# Patient Record
Sex: Female | Born: 1975 | Race: Black or African American | Hispanic: No | Marital: Single | Smoking: Current every day smoker
Health system: Southern US, Community
[De-identification: ages and names within clinical notes are randomized; demographics above are authoritative.]

## PROBLEM LIST (undated history)

## (undated) DIAGNOSIS — F319 Bipolar disorder, unspecified: Secondary | ICD-10-CM

## (undated) DIAGNOSIS — A599 Trichomoniasis, unspecified: Secondary | ICD-10-CM

## (undated) DIAGNOSIS — B9689 Other specified bacterial agents as the cause of diseases classified elsewhere: Secondary | ICD-10-CM

## (undated) DIAGNOSIS — N760 Acute vaginitis: Secondary | ICD-10-CM

## (undated) DIAGNOSIS — A749 Chlamydial infection, unspecified: Secondary | ICD-10-CM

## (undated) HISTORY — PX: FOOT SURGERY: SHX648

## (undated) HISTORY — PX: TUBAL LIGATION: SHX77

---

## 2011-06-06 ENCOUNTER — Emergency Department (HOSPITAL_COMMUNITY)
Admission: EM | Admit: 2011-06-06 | Discharge: 2011-06-06 | Disposition: A | Discharge status: Home / Self Care | Payer: Medicaid Other | Source: Home / Self Care

## 2011-06-06 ENCOUNTER — Encounter (HOSPITAL_COMMUNITY): Payer: Self-pay | Admitting: *Deleted

## 2011-06-06 DIAGNOSIS — N760 Acute vaginitis: Secondary | ICD-10-CM

## 2011-06-06 LAB — POCT URINALYSIS DIP (DEVICE)
Bilirubin Urine: NEGATIVE
Glucose, UA: 100 mg/dL — AB
Ketones, ur: NEGATIVE mg/dL
Protein, ur: NEGATIVE mg/dL
Specific Gravity, Urine: 1.02 (ref 1.005–1.030)

## 2011-06-06 LAB — WET PREP, GENITAL
Trich, Wet Prep: NONE SEEN
Yeast Wet Prep HPF POC: NONE SEEN

## 2011-06-06 LAB — POCT PREGNANCY, URINE: Preg Test, Ur: NEGATIVE

## 2011-06-06 LAB — HIV ANTIBODY (ROUTINE TESTING W REFLEX): HIV: NONREACTIVE

## 2011-06-06 MED ORDER — METRONIDAZOLE 500 MG PO TABS: 500.0000 mg | ORAL_TABLET | Freq: Two times a day (BID) | ORAL | Status: AC

## 2011-06-06 NOTE — Discharge Instructions (Signed)
You will be notified of any abnormal culture results. You must return in person with picture ID to receive your HIV results. No sexual activity for one week. Always use condoms.

## 2011-06-06 NOTE — ED Provider Notes (Signed)
Medical screening examination/treatment/procedure(s) were performed by non-physician practitioner and as supervising physician I was immediately available for consultation/collaboration.  Burnice Vassel, M.D.   Jalynne Persico C Brance Dartt, MD 06/06/11 1947 

## 2011-06-06 NOTE — ED Provider Notes (Signed)
History     CSN: [id number]  Arrival date & time 06/06/11  1129   None     Chief Complaint  Patient presents with  . Vaginal Discharge  . SEXUALLY TRANSMITTED DISEASE    (Consider location/radiation/quality/duration/timing/severity/associated sxs/prior treatment) HPI Comments: Patient presents today with complaints of vaginal discharge for the last 2 weeks. She denies vaginal pain, itch or order but has noticed mild irritation. She has been with her current partner for 6 months and is requesting STD testing including HIV test. She denies dysuria, urinary frequency or pelvic pain. 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   History reviewed. No pertinent past medical history.  History reviewed. No pertinent past surgical history.  History reviewed. No pertinent family history.  History  Substance Use Topics  . Smoking status: Never Smoker   . Smokeless tobacco: Not on file  . Alcohol Use: No    OB History    Grav Para Term Preterm Abortions TAB SAB Ect Mult Living                  Review of Systems  Constitutional: Negative for fever and chills.  Gastrointestinal: Negative for abdominal pain.  Genitourinary: Positive for vaginal discharge. Negative for dysuria, frequency, vaginal pain and pelvic pain.    Allergies  Review of patient's allergies indicates no known allergies.  Home Medications   Current Outpatient Rx  Name Route Sig Dispense Refill  . METRONIDAZOLE 500 MG PO TABS Oral Take 1 tablet (500 mg total) by mouth 2 (two) times daily. 14 tablet 0    BP 106/66  Pulse 76  Temp(Src) 98.2 F (36.8 C) (Oral)  Resp 16  SpO2 100%  LMP 05/30/2011  Physical Exam  Nursing note and vitals reviewed. Constitutional: She appears well-developed and well-nourished. No distress.  HENT:  Head: Normocephalic and atraumatic.  Genitourinary: Uterus normal. There is no rash, tenderness, lesion  or injury on the right labia. There is no rash, tenderness, lesion or injury on the left labia. Cervix exhibits no motion tenderness, no discharge and no friability. Right adnexum displays no mass, no tenderness and no fullness. Left adnexum displays no mass, no tenderness and no fullness. No erythema, tenderness or bleeding around the vagina. No foreign body around the vagina. Vaginal discharge (small amount of white discharge) found.  Neurological: She is alert.  Skin: Skin is warm and dry.  Psychiatric: She has a normal mood and affect.    ED Course  Procedures (including critical care time)  Labs Reviewed  POCT URINALYSIS DIP (DEVICE) - Abnormal; Notable for the following:    Glucose, UA 100 (*)    Nitrite POSITIVE (*)    Leukocytes, UA TRACE (*) Biochemical Testing Only. Please order routine urinalysis from main lab if confirmatory testing is needed.   All other components within normal limits  POCT PREGNANCY, URINE  GC/CHLAMYDIA PROBE AMP, GENITAL  WET PREP, GENITAL  HIV ANTIBODY (ROUTINE TESTING)  RPR   No results found.   1. Vaginitis       MDM  UA pos nitrite and trace leuks. Pt asymptomatic for UTI.         Melody Comas, [place] 06/06/11 1311

## 2011-06-06 NOTE — ED Notes (Signed)
Pt with vaginal discharge x 2 weeks wants STD check - has been with same partner x 6 months afraid he has been with other partners

## 2011-06-07 LAB — GC/CHLAMYDIA PROBE AMP, GENITAL: GC Probe Amp, Genital: NEGATIVE

## 2011-06-07 NOTE — ED Notes (Signed)
GC/Chlamydia pending, Wet prep: Mod. Clue cells, few WBC's, HIV and RPR non-reactive. Pt. adequately treated with Flagyl.  Vassie Moselle 06/07/2011

## 2011-06-08 LAB — URINE CULTURE
Colony Count: >=100000
Culture  Setup Time: 201303241824
Special Requests: NORMAL

## 2011-06-09 ENCOUNTER — Telehealth (HOSPITAL_COMMUNITY): Payer: Self-pay | Admitting: *Deleted

## 2011-06-09 MED ORDER — CEPHALEXIN 500 MG PO CAPS: 500.0000 mg | ORAL_CAPSULE | Freq: Two times a day (BID) | ORAL | Status: AC

## 2011-06-09 NOTE — ED Notes (Signed)
GC/Chlamydia neg., Wet prep: Mod. clue cells, few WBC's, HIV and RPR non-reactive, Urine culture: >100,000 colonies E. Coli.  Lab shown to [employer] PA and she e-prescribed Keflex to pt.'s preferred pharmacy. I called and left pt. a message to call. Vassie Moselle 06/09/2011

## 2011-06-09 NOTE — ED Notes (Signed)
Pt. called back. Pt. verified x 2 and given results except HIV.  Pt. told she would have to come with picture ID to view that result. She said she would be here later. I told pt. to finish her Flagyl for bacterial vaginosis. Pt. told she needs to take a Rx. of Keflex for her UTI. Pt. told it was e-prescribed to her preferred pharmacy. I asked her if she know where that is and she said [facility] on [address]. Pt. instructed to drink plenty of water to flush out the bacteria from her bladder. Pt. voiced understanding. Vassie Moselle 06/09/2011

## 2011-06-09 NOTE — ED Notes (Signed)
Pt. came in and verified with picture ID. I  showed her the neg. HIV result and instructed her to get it rechecked in 6 mos. I verified her preferred pharmacy in the system was the [facility] on [address]. Vassie Moselle 06/09/2011

## 2012-02-13 ENCOUNTER — Emergency Department (HOSPITAL_COMMUNITY)
Admission: EM | Admit: 2012-02-13 | Discharge: 2012-02-13 | Disposition: A | Discharge status: Home / Self Care | Payer: Medicaid Other | Source: Home / Self Care | Attending: Family Medicine | Admitting: Family Medicine

## 2012-02-13 ENCOUNTER — Other Ambulatory Visit (HOSPITAL_COMMUNITY)
Admission: RE | Admit: 2012-02-13 | Discharge: 2012-02-13 | Disposition: A | Discharge status: Home / Self Care | Payer: Medicaid Other | Source: Ambulatory Visit | Attending: Family Medicine | Admitting: Family Medicine

## 2012-02-13 ENCOUNTER — Encounter (HOSPITAL_COMMUNITY): Payer: Self-pay

## 2012-02-13 DIAGNOSIS — G8929 Other chronic pain: Secondary | ICD-10-CM

## 2012-02-13 DIAGNOSIS — N760 Acute vaginitis: Secondary | ICD-10-CM

## 2012-02-13 DIAGNOSIS — M79609 Pain in unspecified limb: Secondary | ICD-10-CM

## 2012-02-13 DIAGNOSIS — Z113 Encounter for screening for infections with a predominantly sexual mode of transmission: Secondary | ICD-10-CM | POA: Insufficient documentation

## 2012-02-13 DIAGNOSIS — Z202 Contact with and (suspected) exposure to infections with a predominantly sexual mode of transmission: Secondary | ICD-10-CM

## 2012-02-13 LAB — POCT I-STAT, CHEM 8
Calcium, Ion: 1.22 mmol/L (ref 1.12–1.23)
Chloride: 104 mEq/L (ref 96–112)
Creatinine, Ser: 1 mg/dL (ref 0.50–1.10)
Glucose, Bld: 94 mg/dL (ref 70–99)
Hemoglobin: 15.6 g/dL — ABNORMAL HIGH (ref 12.0–15.0)
Potassium: 4.1 mEq/L (ref 3.5–5.1)

## 2012-02-13 MED ORDER — FLUCONAZOLE 150 MG PO TABS: ORAL_TABLET | ORAL | Status: DC

## 2012-02-13 MED ORDER — TRAMADOL HCL 50 MG PO TABS: 50.0000 mg | ORAL_TABLET | Freq: Four times a day (QID) | ORAL | Status: DC | PRN

## 2012-02-13 NOTE — ED Notes (Signed)
Patient has many complaints today, states that she does have vaginal itching since Friday, no odor or discharge, she also states that she has left foot pain and wants to be referred to pain management, states that her left foot was broken approx 3 years ago and she still experiences a lot of pain and sometimes has problems walking, also states that she wants to have a HIV test repeated that she had done here in March 2013

## 2012-02-13 NOTE — ED Provider Notes (Signed)
History     CSN: [id number]  Arrival date & time 02/13/12  0901   First MD Initiated Contact with Patient 02/13/12 [phone]      Chief Complaint  Patient presents with  . Vaginal Itching    (Consider location/radiation/quality/duration/timing/severity/associated sxs/prior treatment) HPI Comments: 36 year old female here with the following complaints: #1 vaginal discharge itchiness and irritation for about 3 days. Denies dysuria. Denies pelvic pain. Patient status post bilateral tubal ligation. She was treated here in March for BV. Was tested for HIV. Patient reports unprotected sex again recently and would like to be rechecked for STDs. #2 left foot pain. Patient reports that she had a fracture on her left food over 2 years ago she has developed chronic pain since. Denies swelling or redness. She takes inconsistently over-the-counter Tylenol and Motrin for her pain. She would like to be referred to a pain specialist. Patient moves to [place] from [place] recently does not have a primary care provider here.   History reviewed. No pertinent past medical history.  History reviewed. No pertinent past surgical history.  No family history on file.  History  Substance Use Topics  . Smoking status: Never Smoker   . Smokeless tobacco: Not on file  . Alcohol Use: No    OB History    Grav Para Term Preterm Abortions TAB SAB Ect Mult Living                  Review of Systems  Constitutional: Negative for fever, chills, diaphoresis, activity change, appetite change and fatigue.  Genitourinary: Positive for vaginal discharge. Negative for dysuria, frequency, pelvic pain and dyspareunia.  Musculoskeletal:       Chronic left foot pain as per HPI  Skin: Negative for rash.  Neurological: Negative for dizziness and headaches.  All other systems reviewed and are negative.    Allergies  Review of patient's allergies indicates no known allergies.  Home Medications   Current  Outpatient Rx  Name  Route  Sig  Dispense  Refill  . FLUCONAZOLE 150 MG PO TABS      1 tablet by mouth every 72 hours x4   4 tablet   0   . TRAMADOL HCL 50 MG PO TABS   Oral   Take 1 tablet (50 mg total) by mouth every 6 (six) hours as needed for pain.   15 tablet   0     BP 115/74  Pulse 81  Temp 97.9 F (36.6 C) (Oral)  Resp 18  SpO2 98%  LMP 01/30/2012  Physical Exam  Nursing note and vitals reviewed. Constitutional: She is oriented to person, place, and time. She appears well-developed and well-nourished.  Cardiovascular: Normal heart sounds.   Pulmonary/Chest: Breath sounds normal.  Abdominal: Soft. There is no tenderness. Hernia confirmed negative in the right inguinal area and confirmed negative in the left inguinal area.  Genitourinary: Uterus normal. Cervix exhibits friability. Cervix exhibits no motion tenderness. Right adnexum displays no mass, no tenderness and no fullness. Left adnexum displays no mass, no tenderness and no fullness. No bleeding around the vagina. Vaginal discharge found.  Musculoskeletal:       Left foot: no swelling erythema or deformity. Reported tenderness with palpation diffusely over dorsal and lateral area. Ankle is stable with no swelling. No perimaleolar tenderness to palpation. Weight bearing. No limping. Dorsal pedal and tibial posterior pulses intact.   Lymphadenopathy:    She has no cervical adenopathy.       Right: No inguinal  adenopathy present.       Left: No inguinal adenopathy present.  Neurological: She is alert and oriented to person, place, and time.  Skin: No rash noted.    ED Course  Procedures (including critical care time)  Labs Reviewed  POCT I-STAT, CHEM 8 - Abnormal; Notable for the following:    Hemoglobin 15.6 (*)     All other components within normal limits  CERVICOVAGINAL ANCILLARY ONLY  HIV ANTIBODY (ROUTINE TESTING)   No results found.   1. Vaginitis and vulvovaginitis   2. Exposure to STD   3.  Chronic pain in left foot       MDM  Clinical findings suggestive of yeast vulvovaginitis. Treated empirically with Diflucan. GC, Chlamydia, HIV and wet prep pending at the time of discharge. Encouraged consistent condom use. Chronic pain treated with tramadol when necessary also encouraged to use Tylenol every 8 hours as needed for baseline. Patient was provided with community resources for finding a primary care provider here in [place].       Sharin Grave, MD 02/15/12 [phone]

## 2012-02-14 NOTE — ED Notes (Signed)
Cervicovaginal and HIV reports pending

## 2012-02-17 ENCOUNTER — Telehealth (HOSPITAL_COMMUNITY): Payer: Self-pay | Admitting: *Deleted

## 2012-02-17 NOTE — ED Notes (Signed)
GC/Chlamydia neg., HIV non-reactive, Affirm Vaginitis test: Candida pos., Gardnerella pos., and Trich neg. Message sent to Dr. Tressia Danas that pt. adequately treated with Diflucan.  She wrote back that she wants pt. to get Flagyl 500 mg. BID x 7 days.  I called pt.  Pt. verified x 2 and given results.    Pt. told she needs treatment with Flagyl and instructed to no alcohol while taking this medication.  Pt. wants Rx. called to [facility] at [address]. Rx. called to pharmacist @ [phone]. Vassie Moselle 02/17/2012

## 2012-03-05 ENCOUNTER — Emergency Department (HOSPITAL_COMMUNITY)
Admission: EM | Admit: 2012-03-05 | Discharge: 2012-03-05 | Disposition: A | Discharge status: Home / Self Care | Payer: Medicaid Other | Attending: Emergency Medicine | Admitting: Emergency Medicine

## 2012-03-05 ENCOUNTER — Encounter (HOSPITAL_COMMUNITY): Payer: Self-pay | Admitting: Emergency Medicine

## 2012-03-05 DIAGNOSIS — G8929 Other chronic pain: Secondary | ICD-10-CM | POA: Insufficient documentation

## 2012-03-05 DIAGNOSIS — Z8781 Personal history of (healed) traumatic fracture: Secondary | ICD-10-CM | POA: Insufficient documentation

## 2012-03-05 DIAGNOSIS — M79672 Pain in left foot: Secondary | ICD-10-CM

## 2012-03-05 DIAGNOSIS — M79609 Pain in unspecified limb: Secondary | ICD-10-CM | POA: Insufficient documentation

## 2012-03-05 MED ORDER — HYDROCODONE-ACETAMINOPHEN 5-325 MG PO TABS: 1.0000 | ORAL_TABLET | Freq: Four times a day (QID) | ORAL | Status: DC | PRN

## 2012-03-05 NOTE — ED Provider Notes (Addendum)
History     CSN: [id number]  Arrival date & time 03/05/12  9147   First MD Initiated Contact with Patient 03/05/12 [phone]      Chief Complaint  Patient presents with  . Foot Pain    (Consider location/radiation/quality/duration/timing/severity/associated sxs/prior treatment) Patient is a 36 y.o. female presenting with lower extremity pain. The history is provided by the patient.  Foot Pain This is a chronic problem. Episode onset: months. The problem occurs constantly. The problem has been gradually worsening. Associated symptoms comments: Foot fx years ago but worsening pain and referred to podiatrist for possible surgery in jan.. The symptoms are aggravated by walking and standing. The symptoms are relieved by rest. Treatments tried: tramadol. The treatment provided no relief.    History reviewed. No pertinent past medical history.  History reviewed. No pertinent past surgical history.  History reviewed. No pertinent family history.  History  Substance Use Topics  . Smoking status: Never Smoker   . Smokeless tobacco: Not on file  . Alcohol Use: No    OB History    Grav Para Term Preterm Abortions TAB SAB Ect Mult Living                  Review of Systems  All other systems reviewed and are negative.    Allergies  Review of patient's allergies indicates no known allergies.  Home Medications   Current Outpatient Rx  Name  Route  Sig  Dispense  Refill  . TRAMADOL HCL 50 MG PO TABS   Oral   Take 50 mg by mouth every 6 (six) hours as needed.         Marland Kitchen FLUCONAZOLE 150 MG PO TABS      1 tablet by mouth every 72 hours x4           BP 121/70  Pulse 102  Temp 98.4 F (36.9 C) (Oral)  Resp 20  Wt 218 lb (98.884 kg)  SpO2 98%  LMP 01/30/2012  Physical Exam  Nursing note and vitals reviewed. Constitutional: She is oriented to person, place, and time.  Pulmonary/Chest: Effort normal.  Musculoskeletal:       Left foot: She exhibits tenderness and bony  tenderness. She exhibits normal range of motion and no swelling.       Feet:  Neurological: She is alert and oriented to person, place, and time.  Skin: Skin is warm and dry. No rash noted. No erythema.    ED Course  Procedures (including critical care time)  Labs Reviewed - No data to display No results found.   1. Left foot pain       MDM   Pt with hx of chronic foot pain that has worsened after fracture years ago.  Pt has appt with podiatrist jan 10th but states tramadol is not helping the pain.  Has shoe inserts but stands all day at work.  No hx of new injury.  Will give vicodin for pain.        Gwyneth Sprout, MD 03/05/12 6213  Gwyneth Sprout, MD 03/05/12 [phone]

## 2012-03-05 NOTE — ED Notes (Addendum)
Pt c/o of left foot pain, hx. Broke foot 3 years ago. States she has growth on top of left distal foot. Hurts to walk. PT states she saw Podiatrist on Thurs and given Vicodin that "doesn't work"

## 2012-03-21 ENCOUNTER — Emergency Department (INDEPENDENT_AMBULATORY_CARE_PROVIDER_SITE_OTHER): Payer: Medicaid Other

## 2012-03-21 ENCOUNTER — Encounter (HOSPITAL_COMMUNITY): Payer: Self-pay

## 2012-03-21 ENCOUNTER — Emergency Department (HOSPITAL_COMMUNITY)
Admission: EM | Admit: 2012-03-21 | Discharge: 2012-03-21 | Disposition: A | Discharge status: Home / Self Care | Payer: Medicaid Other | Source: Home / Self Care

## 2012-03-21 DIAGNOSIS — R042 Hemoptysis: Secondary | ICD-10-CM

## 2012-03-21 DIAGNOSIS — M79609 Pain in unspecified limb: Secondary | ICD-10-CM

## 2012-03-21 DIAGNOSIS — J069 Acute upper respiratory infection, unspecified: Secondary | ICD-10-CM

## 2012-03-21 DIAGNOSIS — R05 Cough: Secondary | ICD-10-CM

## 2012-03-21 DIAGNOSIS — R059 Cough, unspecified: Secondary | ICD-10-CM

## 2012-03-21 DIAGNOSIS — G8929 Other chronic pain: Secondary | ICD-10-CM

## 2012-03-21 MED ORDER — GUAIFENESIN-CODEINE 100-10 MG/5ML PO SYRP: ORAL_SOLUTION | ORAL | Status: DC

## 2012-03-21 MED ORDER — HYDROCODONE-ACETAMINOPHEN 5-325 MG PO TABS: 1.0000 | ORAL_TABLET | ORAL | Status: DC | PRN

## 2012-03-21 MED ORDER — PHENYLEPHRINE-CHLORPHEN-DM 10-4-12.5 MG/5ML PO LIQD: 5.0000 mL | ORAL | Status: DC | PRN

## 2012-03-21 NOTE — ED Provider Notes (Signed)
Medical screening examination/treatment/procedure(s) were performed by resident physician or non-physician practitioner and as supervising physician I was immediately available for consultation/collaboration.   Barkley Bruns MD.    Linna Hoff, MD 03/21/12 1755

## 2012-03-21 NOTE — ED Provider Notes (Signed)
History     CSN: [id number]  Arrival date & time 03/21/12  1501   First MD Initiated Contact with Patient 03/21/12 [phone]      Chief Complaint  Patient presents with  . Cough    (Consider location/radiation/quality/duration/timing/severity/associated sxs/prior treatment) HPI Comments: 27. 37 year old female complaining of cough is worse at night. She states she can control it most of the time during the day with some sort lozenges. She says she is also coughing up blood. OTC medications are not helping. She denies shortness of breath or known wheezing. Chest no history of asthma.  2. This is the third  visit for chronic left foot pain. She states several years ago she broke her foot and then continues to have intermittent pain. She has seen an orthopedist or podiatrist recently had placed her on a foot brace and gave her a few pain medicines but she has run out. She states part of the planning is to have an MRI of her foot sometime in the near future. Part of her pain management is to have her be seen in a pain management. She is ambulatory and bearing full weight.   History reviewed. No pertinent past medical history.  History reviewed. No pertinent past surgical history.  History reviewed. No pertinent family history.  History  Substance Use Topics  . Smoking status: Never Smoker   . Smokeless tobacco: Not on file  . Alcohol Use: No    OB History    Grav Para Term Preterm Abortions TAB SAB Ect Mult Living                  Review of Systems  Constitutional: Negative.  Negative for fever and fatigue.  HENT: Positive for postnasal drip. Negative for congestion, sore throat and ear discharge.   Respiratory: Positive for cough.   Cardiovascular: Negative.   Gastrointestinal: Negative.   Genitourinary: Negative.   Musculoskeletal:       See history of present illness  Neurological: Negative.   Psychiatric/Behavioral: Negative.     Allergies  Review of patient's allergies  indicates no known allergies.  Home Medications   Current Outpatient Rx  Name  Route  Sig  Dispense  Refill  . FLUCONAZOLE 150 MG PO TABS      1 tablet by mouth every 72 hours x4         . GUAIFENESIN-CODEINE 100-10 MG/5ML PO SYRP      1-2 teaspoons every 4 hours prn cough   120 mL   0   . HYDROCODONE-ACETAMINOPHEN 5-325 MG PO TABS   Oral   Take 1 tablet by mouth every 6 (six) hours as needed for pain.   20 tablet   0   . HYDROCODONE-ACETAMINOPHEN 5-325 MG PO TABS   Oral   Take 1 tablet by mouth every 4 (four) hours as needed for pain.   15 tablet   0   . PHENYLEPHRINE-CHLORPHEN-DM 12-17-10.5 MG/5ML PO LIQD   Oral   Take 5 mLs by mouth every 4 (four) hours as needed. For cough and drainage  May cause drowsiness   120 mL   0   . TRAMADOL HCL 50 MG PO TABS   Oral   Take 50 mg by mouth every 6 (six) hours as needed.           BP 103/71  Pulse 99  Temp 99.9 F (37.7 C) (Oral)  Resp 18  SpO2 100%  LMP 03/03/2012  Physical Exam  Nursing note  and vitals reviewed. Constitutional: She is oriented to person, place, and time. She appears well-developed and well-nourished. No distress.  HENT:       Bilateral TMs are normal Oropharynx with minor erythema and clear PND. No exudates  Eyes: Conjunctivae normal and EOM are normal.  Neck: Normal range of motion. Neck supple.  Cardiovascular: Normal rate, regular rhythm and normal heart sounds.   Pulmonary/Chest: Effort normal and breath sounds normal. No respiratory distress. She has no wheezes. She has no rales.  Musculoskeletal: Normal range of motion.  Lymphadenopathy:    She has no cervical adenopathy.  Neurological: She is alert and oriented to person, place, and time. She exhibits normal muscle tone.  Skin: Skin is warm and dry.  Psychiatric: She has a normal mood and affect.    ED Course  Procedures (including critical care time)  Labs Reviewed - No data to display Dg Chest 2 View  03/21/2012   *RADIOLOGY REPORT*  Clinical Data: Cough.  Fever.  CHEST - 2 VIEW  Comparison: None.  Findings:  Cardiopericardial silhouette within normal limits. Mediastinal contours normal. Trachea midline.  No airspace disease or effusion.  Dextroconvex thoracic scoliosis is present.  IMPRESSION: No active cardiopulmonary disease.   Original Report Authenticated By: Andreas Newport, M.D.      1. Chronic pain in left foot   2. URI (upper respiratory infection)   3. Cough   4. Hemoptysis       MDM  The patient's cough is most likely due to her PND. Chest some injection in the posterior pharynx with irritation and may suggest mucosal irritation and minor breakdown. This could be the source of her blood. She does not have shortness of breath the lungs are clear. Chest x-ray is negative. She was advised that if she has hemoptysis for another week despite improvement in her cough she is to followup with her PCP promptly. There is no known history of exposure to anyone with TB. And she does not have a past medical history of lung infections. Norell CS 1 teaspoon every 4-6 hours when necessary cough and drainage Cheratussin 1-2 teaspoons every 4 hours when necessary cough Norco 5 one every 4 hours when necessary pain #15 Must followup with your podiatrist as scheduled. He podiatrist or ear pain management clinic will have to be the prescriber should for chronic foot pain.        Hayden Rasmussen, NP 03/21/12 1747

## 2012-03-21 NOTE — ED Notes (Signed)
Cough for several days, keeps her awake at night; also has chronic pain in her foot , and needs something to carry her over till she can see her foot MD

## 2012-06-19 ENCOUNTER — Emergency Department (HOSPITAL_COMMUNITY)
Admission: EM | Admit: 2012-06-19 | Discharge: 2012-06-19 | Disposition: A | Discharge status: Home / Self Care | Payer: Medicaid Other | Attending: Emergency Medicine | Admitting: Emergency Medicine

## 2012-06-19 ENCOUNTER — Encounter (HOSPITAL_COMMUNITY): Payer: Self-pay | Admitting: Emergency Medicine

## 2012-06-19 ENCOUNTER — Emergency Department (HOSPITAL_COMMUNITY): Payer: Medicaid Other

## 2012-06-19 DIAGNOSIS — Z79899 Other long term (current) drug therapy: Secondary | ICD-10-CM | POA: Insufficient documentation

## 2012-06-19 DIAGNOSIS — B9689 Other specified bacterial agents as the cause of diseases classified elsewhere: Secondary | ICD-10-CM

## 2012-06-19 DIAGNOSIS — F17200 Nicotine dependence, unspecified, uncomplicated: Secondary | ICD-10-CM | POA: Insufficient documentation

## 2012-06-19 DIAGNOSIS — N940 Mittelschmerz: Secondary | ICD-10-CM

## 2012-06-19 DIAGNOSIS — I10 Essential (primary) hypertension: Secondary | ICD-10-CM | POA: Insufficient documentation

## 2012-06-19 DIAGNOSIS — N760 Acute vaginitis: Secondary | ICD-10-CM

## 2012-06-19 DIAGNOSIS — Z3202 Encounter for pregnancy test, result negative: Secondary | ICD-10-CM | POA: Insufficient documentation

## 2012-06-19 LAB — RPR: RPR Ser Ql: NONREACTIVE

## 2012-06-19 LAB — CBC WITH DIFFERENTIAL/PLATELET
Eosinophils Relative: 2 % (ref 0–5)
HCT: 38 % (ref 36.0–46.0)
Lymphocytes Relative: 31 % (ref 12–46)
Lymphs Abs: 2 10*3/uL (ref 0.7–4.0)
MCV: 85.4 fL (ref 78.0–100.0)
Monocytes Absolute: 0.5 10*3/uL (ref 0.1–1.0)
Monocytes Relative: 8 % (ref 3–12)
RBC: 4.45 MIL/uL (ref 3.87–5.11)
WBC: 6.3 10*3/uL (ref 4.0–10.5)

## 2012-06-19 LAB — COMPREHENSIVE METABOLIC PANEL
ALT: 9 U/L (ref 0–35)
CO2: 25 mEq/L (ref 19–32)
Calcium: 9.2 mg/dL (ref 8.4–10.5)
Creatinine, Ser: 0.83 mg/dL (ref 0.50–1.10)
GFR calc Af Amer: >90 mL/min (ref >90)
GFR calc non Af Amer: 89 mL/min — ABNORMAL LOW (ref >90)
Glucose, Bld: 83 mg/dL (ref 70–99)

## 2012-06-19 LAB — URINALYSIS, ROUTINE W REFLEX MICROSCOPIC
Glucose, UA: NEGATIVE mg/dL
Hgb urine dipstick: NEGATIVE
Specific Gravity, Urine: 1.014 (ref 1.005–1.030)

## 2012-06-19 LAB — WET PREP, GENITAL: Trich, Wet Prep: NONE SEEN

## 2012-06-19 LAB — URINE MICROSCOPIC-ADD ON

## 2012-06-19 LAB — POCT PREGNANCY, URINE: Preg Test, Ur: NEGATIVE

## 2012-06-19 MED ORDER — HYDROMORPHONE HCL PF 1 MG/ML IJ SOLN
1.0000 mg | Freq: Once | INTRAMUSCULAR | Status: AC
  Administered 2012-06-19: 1 mg via INTRAVENOUS
  Filled 2012-06-19: qty 1

## 2012-06-19 MED ORDER — SODIUM CHLORIDE 0.9 % IV SOLN
Freq: Once | INTRAVENOUS | Status: AC
  Administered 2012-06-19: 10:00:00 via INTRAVENOUS

## 2012-06-19 MED ORDER — OXYCODONE-ACETAMINOPHEN 5-325 MG PO TABS: 1.0000 | ORAL_TABLET | ORAL | Status: DC | PRN

## 2012-06-19 MED ORDER — ACETAMINOPHEN 325 MG PO TABS
650.0000 mg | ORAL_TABLET | Freq: Once | ORAL | Status: AC
  Administered 2012-06-19: 650 mg via ORAL
  Filled 2012-06-19: qty 2

## 2012-06-19 MED ORDER — POTASSIUM CHLORIDE CRYS ER 20 MEQ PO TBCR
40.0000 meq | EXTENDED_RELEASE_TABLET | Freq: Once | ORAL | Status: AC
  Administered 2012-06-19: 40 meq via ORAL
  Filled 2012-06-19: qty 2

## 2012-06-19 MED ORDER — METRONIDAZOLE 500 MG PO TABS: 500.0000 mg | ORAL_TABLET | Freq: Two times a day (BID) | ORAL | Status: DC

## 2012-06-19 MED ORDER — ONDANSETRON HCL 4 MG/2ML IJ SOLN
4.0000 mg | Freq: Once | INTRAMUSCULAR | Status: AC
  Administered 2012-06-19: 4 mg via INTRAVENOUS
  Filled 2012-06-19: qty 2

## 2012-06-19 NOTE — ED Notes (Signed)
Patient walked to restroom and tolerated well.  

## 2012-06-19 NOTE — ED Notes (Signed)
Patient request to get off the monitor so she could walk around.  "I know it's gas, so I have to walk".

## 2012-06-19 NOTE — ED Notes (Signed)
Patient states she had been cleaning and had no pain.   She came home and her R lower abdomen started hurting 9/10 pain that was sharp with no radiation.   Patient states that the pain has stayed constant since yesterday.  Patient took tylenol for pain without any relief. Patient denies N/V but did have some diarrhea.

## 2012-06-19 NOTE — ED Provider Notes (Signed)
History     CSN: [id number]  Arrival date & time 06/19/12  0809   First MD Initiated Contact with Patient 06/19/12 [phone]      Chief Complaint  Patient presents with  . Abdominal Pain    Right lower abdominal pain without radiation.    (Consider location/radiation/quality/duration/timing/severity/associated sxs/prior treatment) Patient is a 37 y.o. female presenting with abdominal pain. The history is provided by the patient.  Abdominal Pain She had onset yesterday of pain in the right lower abdomen with radiation toward the right flank. It occurred while cleaning. It is moderately severe and she rates it an 8/10. It is worse with sitting but nothing makes it better. There is no associated nausea, vomiting, dysuria, vaginal discharge. She has noted diarrhea but pain is not affected by bowel movement. She denies fever, chills, sweats. She's not taken anything for pain. Last menstrual period was 2 weeks ago was normal. She status post tubal ligation.  Past Medical History  Diagnosis Date  . Hypertension     Past Surgical History  Procedure Laterality Date  . Tubal ligation      No family history on file.  History  Substance Use Topics  . Smoking status: Current Every Day Smoker -- 0.10 packs/day    Types: Cigarettes  . Smokeless tobacco: Not on file  . Alcohol Use: No    OB History   Grav Para Term Preterm Abortions TAB SAB Ect Mult Living                  Review of Systems  Gastrointestinal: Positive for abdominal pain.  All other systems reviewed and are negative.    Allergies  Review of patient's allergies indicates no known allergies.  Home Medications   Current Outpatient Rx  Name  Route  Sig  Dispense  Refill  . hydrochlorothiazide (HYDRODIURIL) 25 MG tablet   Oral   Take 25 mg by mouth daily.           BP 127/69  Pulse 100  Temp(Src) 98.2 F (36.8 C) (Oral)  Resp 18  Ht 5\' 6"  (1.676 m)  Wt 206 lb (93.441 kg)  BMI 33.27 kg/m2  SpO2 100%   LMP 06/05/2012  Physical Exam  Nursing note and vitals reviewed.  37 year old female, resting comfortably and in no acute distress. Vital signs are normal. Oxygen saturation is 100%, which is normal. Head is normocephalic and atraumatic. PERRLA, EOMI. Oropharynx is clear. Neck is nontender and supple without adenopathy or JVD. Back is nontender and there is mild right CVA tenderness. Lungs are clear without rales, wheezes, or rhonchi. Chest is nontender. Heart has regular rate and rhythm without murmur. Abdomen is soft, flat, with significant tenderness in the right mid and lower abdomen and across the suprapubic area. There is no rebound or guarding. There are no masses or hepatosplenomegaly and peristalsis is hypoactive. Extremities have no cyanosis or edema, full range of motion is present. Skin is warm and dry without rash. Neurologic: Mental status is normal, cranial nerves are intact, there are no motor or sensory deficits.  ED Course  Procedures (including critical care time)  Results for orders placed during the hospital encounter of 06/19/12  WET PREP, GENITAL      Result Value Range   Yeast Wet Prep HPF POC NONE SEEN  NONE SEEN   Trich, Wet Prep NONE SEEN  NONE SEEN   Clue Cells Wet Prep HPF POC FEW (*) NONE SEEN   WBC, Wet  Prep HPF POC TOO NUMEROUS TO COUNT (*) NONE SEEN  URINALYSIS, ROUTINE W REFLEX MICROSCOPIC      Result Value Range   Color, Urine YELLOW  YELLOW   APPearance CLEAR  CLEAR   Specific Gravity, Urine 1.014  1.005 - 1.030   pH 6.5  5.0 - 8.0   Glucose, UA NEGATIVE  NEGATIVE mg/dL   Hgb urine dipstick NEGATIVE  NEGATIVE   Bilirubin Urine NEGATIVE  NEGATIVE   Ketones, ur NEGATIVE  NEGATIVE mg/dL   Protein, ur NEGATIVE  NEGATIVE mg/dL   Urobilinogen, UA 1.0  0.0 - 1.0 mg/dL   Nitrite NEGATIVE  NEGATIVE   Leukocytes, UA MODERATE (*) NEGATIVE  CBC WITH DIFFERENTIAL      Result Value Range   WBC 6.3  4.0 - 10.5 K/uL   RBC 4.45  3.87 - 5.11 MIL/uL    Hemoglobin 13.4  12.0 - 15.0 g/dL   HCT 16.1  09.6 - 04.5 %   MCV 85.4  78.0 - 100.0 fL   MCH 30.1  26.0 - 34.0 pg   MCHC 35.3  30.0 - 36.0 g/dL   RDW 40.9  81.1 - 91.4 %   Platelets 196  150 - 400 K/uL   Neutrophils Relative 59  43 - 77 %   Neutro Abs 3.7  1.7 - 7.7 K/uL   Lymphocytes Relative 31  12 - 46 %   Lymphs Abs 2.0  0.7 - 4.0 K/uL   Monocytes Relative 8  3 - 12 %   Monocytes Absolute 0.5  0.1 - 1.0 K/uL   Eosinophils Relative 2  0 - 5 %   Eosinophils Absolute 0.1  0.0 - 0.7 K/uL   Basophils Relative 0  0 - 1 %   Basophils Absolute 0.0  0.0 - 0.1 K/uL  COMPREHENSIVE METABOLIC PANEL      Result Value Range   Sodium 138  135 - 145 mEq/L   Potassium 3.2 (*) 3.5 - 5.1 mEq/L   Chloride 103  96 - 112 mEq/L   CO2 25  19 - 32 mEq/L   Glucose, Bld 83  70 - 99 mg/dL   BUN 7  6 - 23 mg/dL   Creatinine, Ser 7.82  0.50 - 1.10 mg/dL   Calcium 9.2  8.4 - 95.6 mg/dL   Total Protein 7.3  6.0 - 8.3 g/dL   Albumin 3.8  3.5 - 5.2 g/dL   AST 27  0 - 37 U/L   ALT 9  0 - 35 U/L   Alkaline Phosphatase 60  39 - 117 U/L   Total Bilirubin 0.4  0.3 - 1.2 mg/dL   GFR calc non Af Amer 89 (*) >90 mL/min   GFR calc Af Amer >90  >90 mL/min  LIPASE, BLOOD      Result Value Range   Lipase 23  11 - 59 U/L  URINE MICROSCOPIC-ADD ON      Result Value Range   Squamous Epithelial / LPF FEW (*) RARE   WBC, UA 3-6  <3 WBC/hpf   Bacteria, UA FEW (*) RARE  POCT PREGNANCY, URINE      Result Value Range   Preg Test, Ur NEGATIVE  NEGATIVE   Ct Abdomen Pelvis Wo Contrast  06/19/2012  *RADIOLOGY REPORT*  Clinical Data:  Right-sided pain.  Symptoms since yesterday. Diarrhea.  CT ABDOMEN AND PELVIS WITHOUT CONTRAST (CT UROGRAM)  Technique: Contiguous axial images of the abdomen and pelvis without oral or intravenous contrast were obtained.  Comparison: None  Findings:  Exam is limited for evaluation of entities other than urinary tract calculi due to lack of oral or intravenous contrast.   Lung bases:  Normal   Abdomen/pelvis:  Normal uninfused appearance of the liver, spleen, stomach, pancreas, gallbladder, biliary tract, adrenal glands.  No renal calculi or hydronephrosis.  Phleboliths in the pelvis, but no hydroureter or ureteric stone.  No retroperitoneal or retrocrural adenopathy.  Normal colon and terminal ileum.  The appendix crosses midline. Distally, it is gas filled and unremarkable on image 57/series 2. More proximally, it measures upper normal to minimally dilated, 9 mm on coronal image 44.  No convincing evidence of surrounding inflammation.  Normal small bowel without abdominal ascites.  No pelvic adenopathy.  Normal urinary bladder.  Retroverted uterus. Small volume simple appearing pelvic fluid, slightly greater than typically seen physiologically.  No gross adnexal mass.  Bones/Musculoskeletal:  No acute osseous abnormality.  IMPRESSION: 1.  No urinary tract calculi or hydronephrosis. 2.  Normal appearance of the distal appendix with borderline to minimal enlargement of the proximal appendix.  If early appendicitis is a clinical concern, consider repeat CT with intravenous and preferably enteric contrast. 3.  Slightly greater than typical simple appearing pelvic fluid. Question recent follicle rupture.   Original Report Authenticated By: Jeronimo Greaves, M.D.       1. Mittelschmerz   2. Bacterial vaginosis       MDM  Right lower quadrant pain of uncertain cause. She needs to be evaluated for possible ovarian cyst, possible UTI, possible kidney stone, possible appendicitis. She will be given IV fluids and IV narcotics. Review of past records showed no prior ED visits for abdominal pain but she has had prior he visits for STD check.  Pelvic exam: Normal external female genitalia, moderate discharge which appears somewhat purulent, no cervical motion tenderness, fundus normal size and position, bilateral adnexal tenderness which is moderate without any adnexal masses. Urinalysis is come back fairly  unremarkable. Picture at this point seems most consistent with mittelschmerz, but she will be sent for CT scan to rule out urolithiasis.  CT scan is consistent with mittelschmerz. No evidence of appendicitis or urolithiasis. Wet prep is demonstrated the bacterial vaginosis. She is discharged with prescriptions for metronidazole and Percocet.  Dione Booze, MD 06/19/12 1345

## 2012-06-19 NOTE — ED Notes (Signed)
Pt left before vitals could be updated and would not wait.

## 2012-06-19 NOTE — ED Notes (Signed)
Patient disconnected the saline stating "this gives me gas or something and makes me cramp.  I don't want no more."

## 2012-06-20 LAB — URINE CULTURE

## 2012-06-20 LAB — GC/CHLAMYDIA PROBE AMP: CT Probe RNA: NEGATIVE

## 2012-09-06 ENCOUNTER — Emergency Department (HOSPITAL_COMMUNITY)
Admission: EM | Admit: 2012-09-06 | Discharge: 2012-09-06 | Disposition: A | Discharge status: Home / Self Care | Payer: Medicaid Other | Attending: Emergency Medicine | Admitting: Emergency Medicine

## 2012-09-06 ENCOUNTER — Encounter (HOSPITAL_COMMUNITY): Payer: Self-pay | Admitting: Emergency Medicine

## 2012-09-06 DIAGNOSIS — G8929 Other chronic pain: Secondary | ICD-10-CM

## 2012-09-06 DIAGNOSIS — F17200 Nicotine dependence, unspecified, uncomplicated: Secondary | ICD-10-CM | POA: Insufficient documentation

## 2012-09-06 DIAGNOSIS — M25579 Pain in unspecified ankle and joints of unspecified foot: Secondary | ICD-10-CM | POA: Insufficient documentation

## 2012-09-06 DIAGNOSIS — Z9889 Other specified postprocedural states: Secondary | ICD-10-CM | POA: Insufficient documentation

## 2012-09-06 DIAGNOSIS — Z79899 Other long term (current) drug therapy: Secondary | ICD-10-CM | POA: Insufficient documentation

## 2012-09-06 DIAGNOSIS — I10 Essential (primary) hypertension: Secondary | ICD-10-CM | POA: Insufficient documentation

## 2012-09-06 MED ORDER — KETOROLAC TROMETHAMINE 30 MG/ML IJ SOLN
30.0000 mg | Freq: Once | INTRAMUSCULAR | Status: AC
  Administered 2012-09-06: 30 mg via INTRAMUSCULAR
  Filled 2012-09-06: qty 1

## 2012-09-06 MED ORDER — TRAMADOL HCL 50 MG PO TABS: 50.0000 mg | ORAL_TABLET | Freq: Four times a day (QID) | ORAL | Status: DC | PRN

## 2012-09-06 NOTE — ED Notes (Signed)
Patient states has chronic L foot pain.  Patient states she has been having pain for 11 years.  She has been going to a pain clinic.   She advised she hasn't been able to get in to see them to get a refill on pain medication.   Patient states she has been given "percocet 10's".  The clinic where she had been seen [facility]  She claims she can never get in touch with them.  She claims needs her percocets upped and needs a few days to get her through.  Patient states she hasn't had pain medicine for 1 week.

## 2012-09-06 NOTE — ED Notes (Signed)
Beck PA at bedside to evaluate patient.

## 2012-09-06 NOTE — ED Provider Notes (Signed)
History    CSN: [id number] Arrival date & time 09/06/12  0921  First MD Initiated Contact with Patient 09/06/12 0935     Chief Complaint  Patient presents with  . Foot Pain   (Consider location/radiation/quality/duration/timing/severity/associated sxs/prior Treatment) HPI Comments: 37 y.o. Female with PMH of HTN presents today with an exacerbation of 37 year old chronic left foot pain that has been gradually worsening over a period of week. Pt states she did have surgery on it 11 years ago, was told she did not let it heal properly and has been dealing with the pain ever since. Pt has moved around quite a bit since then and has never established herself as a pt with one doctor or one pain management place. She is now seeing Dr. Mayford Knife who is helping her get to a chronic pain management office, but she has been unable to reach them yet. She is here today seeking relief of pain.   No new injury. Pain is sharp. Localized to the dorsum of the foot proximal to the toes. Severe at times. Intermittent. Pt states percocet makes it better, but she is out.   Patient is a 37 y.o. female presenting with lower extremity pain.  Foot Pain Associated symptoms include arthralgias. Pertinent negatives include no chest pain, diaphoresis, fever, headaches, nausea, neck pain, numbness, rash, vomiting or weakness.   Past Medical History  Diagnosis Date  . Hypertension    Past Surgical History  Procedure Laterality Date  . Tubal ligation    . Foot surgery     No family history on file. History  Substance Use Topics  . Smoking status: Current Every Day Smoker -- 0.50 packs/day    Types: Cigarettes  . Smokeless tobacco: Not on file  . Alcohol Use: No   OB History   Grav Para Term Preterm Abortions TAB SAB Ect Mult Living                 Review of Systems  Constitutional: Negative for fever and diaphoresis.  HENT: Negative for neck pain and neck stiffness.   Eyes: Negative for visual  disturbance.  Respiratory: Negative for apnea, chest tightness and shortness of breath.   Cardiovascular: Negative for chest pain and palpitations.  Gastrointestinal: Negative for nausea, vomiting, diarrhea and constipation.  Genitourinary: Negative for dysuria.  Musculoskeletal: Positive for arthralgias. Negative for gait problem.       Left foot pain  Skin: Negative for rash.  Neurological: Negative for dizziness, weakness, light-headedness, numbness and headaches.    Allergies  Review of patient's allergies indicates no known allergies.  Home Medications   Current Outpatient Rx  Name  Route  Sig  Dispense  Refill  . hydrochlorothiazide (HYDRODIURIL) 25 MG tablet   Oral   Take 25 mg by mouth daily.         . metroNIDAZOLE (FLAGYL) 500 MG tablet   Oral   Take 1 tablet (500 mg total) by mouth 2 (two) times daily.   14 tablet   0   . oxyCODONE-acetaminophen (PERCOCET/ROXICET) 5-325 MG per tablet   Oral   Take 1 tablet by mouth every 4 (four) hours as needed for pain.   20 tablet   0    BP 114/79  Pulse 91  Temp(Src) 98.5 F (36.9 C) (Oral)  Resp 16  Ht 5\' 6"  (1.676 m)  Wt 200 lb (90.719 kg)  BMI 32.3 kg/m2  SpO2 100%  LMP 08/23/2012 Physical Exam  Nursing note and vitals reviewed.  Constitutional: She is oriented to person, place, and time. She appears well-developed and well-nourished. No distress.  HENT:  Head: Normocephalic and atraumatic.  Eyes: Conjunctivae and EOM are normal.  Neck: Normal range of motion. Neck supple.  No meningeal signs  Cardiovascular: Normal rate, regular rhythm, normal heart sounds and intact distal pulses.  Exam reveals no gallop and no friction rub.   No murmur heard. Pulmonary/Chest: Effort normal and breath sounds normal. No respiratory distress. She has no wheezes. She has no rales. She exhibits no tenderness.  Abdominal: Soft. Bowel sounds are normal. She exhibits no distension. There is no tenderness. There is no rebound and  no guarding.  Musculoskeletal: Normal range of motion. She exhibits no edema and no tenderness.  FROM to upper and lower extremities No tenderness to palpation where pt endorses pain (dorsal aspect of left foot proximal to toes)  Neurological: She is alert and oriented to person, place, and time. No cranial nerve deficit.  Speech is clear and goal oriented, follows commands Sensation normal to light touch and two point discrimination Moves extremities without ataxia, coordination intact Normal gait and balance Normal strength in upper and lower extremities bilaterally including dorsiflexion and plantar flexion, strong and equal grip strength   Skin: Skin is warm and dry. She is not diaphoretic. No erythema.  Psychiatric: She has a normal mood and affect.    ED Course  Procedures (including critical care time)  Medications  ketorolac (TORADOL) 30 MG/ML injection 30 mg (not administered)    Labs Reviewed - No data to display No results found. 1. Chronic foot pain, left    New Prescriptions   TRAMADOL (ULTRAM) 50 MG TABLET    Take 1 tablet (50 mg total) by mouth every 6 (six) hours as needed for pain.    MDM  No new injury. PE is benign for concern of bony injury. Neurovascularly intact. Patient with back pain.   I feel that the pt's pain is chronic and cannot appropriately or safely treated in an emergency department setting.  I do not feel that providing narcotic pain medication is in this pt's best interest.  I have urged the pt to follow up closely with their PMD or pain specialist.  I have explicitly discussed with the pt return precautions and have reassured him that she can always be seen and evaluated in the emergency department for any condition that she feels is emergent, and that she will be given treatment as is appropriate and safe, but this may not involve the use of narcotic pain medications.  The pt was given the opportunity to voice any further questions or concerns and  these were addressed to the best of my ability.  Discussed chronic pain management and that the ED is not designed to treat chronic pain. Provided resource list that includes the number for Pain Management as well as information on ED chronic pain management policy. Will give pt toradol here today to relieve pain and prescribe ultram for home. Discussed reasons to seek immediate care. Patient expresses understanding and agrees with plan.    Glade Nurse, PA-C 09/06/12 1000

## 2012-09-07 NOTE — ED Provider Notes (Signed)
Medical screening examination/treatment/procedure(s) were performed by non-physician practitioner and as supervising physician I was immediately available for consultation/collaboration.  Olivia Mackie, MD 09/07/12 [phone]

## 2012-09-24 ENCOUNTER — Other Ambulatory Visit (HOSPITAL_COMMUNITY)
Admission: RE | Admit: 2012-09-24 | Discharge: 2012-09-24 | Disposition: A | Discharge status: Home / Self Care | Payer: Medicaid Other | Source: Ambulatory Visit | Attending: Family Medicine | Admitting: Family Medicine

## 2012-09-24 ENCOUNTER — Emergency Department (HOSPITAL_COMMUNITY)
Admission: EM | Admit: 2012-09-24 | Discharge: 2012-09-24 | Disposition: A | Discharge status: Home / Self Care | Payer: Medicaid Other | Source: Home / Self Care | Attending: Family Medicine | Admitting: Family Medicine

## 2012-09-24 ENCOUNTER — Encounter (HOSPITAL_COMMUNITY): Payer: Self-pay | Admitting: *Deleted

## 2012-09-24 DIAGNOSIS — B9689 Other specified bacterial agents as the cause of diseases classified elsewhere: Secondary | ICD-10-CM

## 2012-09-24 DIAGNOSIS — Z113 Encounter for screening for infections with a predominantly sexual mode of transmission: Secondary | ICD-10-CM | POA: Insufficient documentation

## 2012-09-24 DIAGNOSIS — N760 Acute vaginitis: Secondary | ICD-10-CM | POA: Insufficient documentation

## 2012-09-24 DIAGNOSIS — A499 Bacterial infection, unspecified: Secondary | ICD-10-CM

## 2012-09-24 HISTORY — DX: Trichomoniasis, unspecified: A59.9

## 2012-09-24 HISTORY — DX: Other specified bacterial agents as the cause of diseases classified elsewhere: B96.89

## 2012-09-24 HISTORY — DX: Acute vaginitis: N76.0

## 2012-09-24 HISTORY — DX: Chlamydial infection, unspecified: A74.9

## 2012-09-24 LAB — POCT URINALYSIS DIP (DEVICE)
Bilirubin Urine: NEGATIVE
Glucose, UA: NEGATIVE mg/dL
Ketones, ur: NEGATIVE mg/dL
pH: 6.5 (ref 5.0–8.0)

## 2012-09-24 LAB — POCT PREGNANCY, URINE: Preg Test, Ur: NEGATIVE

## 2012-09-24 MED ORDER — METRONIDAZOLE 250 MG PO TABS: 250.0000 mg | ORAL_TABLET | Freq: Three times a day (TID) | ORAL | Status: DC

## 2012-09-24 MED ORDER — METRONIDAZOLE 0.75 % VA GEL: 1.0000 | VAGINAL | Status: DC

## 2012-09-24 NOTE — ED Notes (Signed)
C/O malodorous vaginal discharge x 1 wk.  Denies pain.  Has unprotected intercourse with unfaithful partner.

## 2012-09-24 NOTE — ED Provider Notes (Signed)
History    CSN: [id number] Arrival date & time 09/24/12  1103  First MD Initiated Contact with Patient 09/24/12 1111     Chief Complaint  Patient presents with  . Vaginal Discharge   (Consider location/radiation/quality/duration/timing/severity/associated sxs/prior Treatment) Patient is a 37 y.o. female presenting with vaginal discharge. The history is provided by the patient.  Vaginal Discharge Quality:  Thin Severity:  Mild Onset quality:  Gradual Duration:  1 week Progression:  Unchanged Chronicity:  New Associated symptoms: no abdominal pain, no dysuria, no rash, no urinary frequency, no urinary hesitancy, no urinary incontinence and no vaginal itching   Risk factors: unprotected sex   Risk factors: no STI exposure    Past Medical History  Diagnosis Date  . Hypertension   . BV (bacterial vaginosis)   . Chlamydia   . Trichomonas    Past Surgical History  Procedure Laterality Date  . Tubal ligation    . Foot surgery     No family history on file. History  Substance Use Topics  . Smoking status: Current Every Day Smoker -- 0.50 packs/day    Types: Cigarettes  . Smokeless tobacco: Not on file  . Alcohol Use: No   OB History   Grav Para Term Preterm Abortions TAB SAB Ect Mult Living                 Review of Systems  Constitutional: Negative.   Gastrointestinal: Negative.  Negative for abdominal pain.  Genitourinary: Positive for vaginal discharge. Negative for bladder incontinence, dysuria, hesitancy, urgency, frequency and vaginal bleeding.    Allergies  Review of patient's allergies indicates no known allergies.  Home Medications   Current Outpatient Rx  Name  Route  Sig  Dispense  Refill  . HYDROcodone-acetaminophen (NORCO) 10-325 MG per tablet   Oral   Take 1 tablet by mouth every 6 (six) hours as needed for pain.         . Multiple Vitamin (MULTIVITAMIN WITH MINERALS) TABS   Oral   Take 1 tablet by mouth daily.         . metroNIDAZOLE  (FLAGYL) 250 MG tablet   Oral   Take 1 tablet (250 mg total) by mouth 3 (three) times daily.   21 tablet   0   . metroNIDAZOLE (METROGEL VAGINAL) 0.75 % vaginal gel   Vaginal   Place 1 Applicatorful vaginally 1 day or 1 dose. Nightly for 5 nights   70 g   0   . traMADol (ULTRAM) 50 MG tablet   Oral   Take 1 tablet (50 mg total) by mouth every 6 (six) hours as needed for pain.   15 tablet   0    BP 118/75  Pulse 79  Temp(Src) 99.2 F (37.3 C) (Oral)  Resp 18  SpO2 100%  LMP 08/02/2012 Physical Exam  Nursing note and vitals reviewed. Constitutional: She is oriented to person, place, and time.  Abdominal: Soft. Bowel sounds are normal. She exhibits no distension and no mass. There is no tenderness. There is no rebound and no guarding.  Genitourinary: Vagina normal and uterus normal. No vaginal discharge found.  Neurological: She is alert and oriented to person, place, and time.  Skin: Skin is warm and dry.    ED Course  Procedures (including critical care time) Labs Reviewed  POCT URINALYSIS DIP (DEVICE) - Abnormal; Notable for the following:    Leukocytes, UA SMALL (*)    All other components within normal limits  POCT PREGNANCY, URINE  CERVICOVAGINAL ANCILLARY ONLY   No results found. 1. Bacterial vaginosis     MDM  U/a wnl.  Linna Hoff, MD 09/24/12 [phone]

## 2012-09-25 NOTE — ED Notes (Addendum)
GC/Chlamydia neg., Affirm: Candida and Trich neg., Gardnerella pos. Pt. adequately treated with Metrogel and Flagyl. Amy Le 09/25/2012

## 2012-10-16 ENCOUNTER — Ambulatory Visit: Payer: Medicaid Other | Admitting: Physical Therapy

## 2012-10-17 ENCOUNTER — Ambulatory Visit: Payer: Medicaid Other

## 2014-09-15 DIAGNOSIS — M545 Low back pain: Secondary | ICD-10-CM | POA: Diagnosis not present

## 2014-09-15 DIAGNOSIS — G8929 Other chronic pain: Secondary | ICD-10-CM | POA: Insufficient documentation

## 2014-09-15 DIAGNOSIS — I10 Essential (primary) hypertension: Secondary | ICD-10-CM | POA: Insufficient documentation

## 2014-09-15 DIAGNOSIS — M79605 Pain in left leg: Secondary | ICD-10-CM | POA: Diagnosis not present

## 2014-09-15 DIAGNOSIS — Z8742 Personal history of other diseases of the female genital tract: Secondary | ICD-10-CM | POA: Diagnosis not present

## 2014-09-15 DIAGNOSIS — Z720 Tobacco use: Secondary | ICD-10-CM | POA: Diagnosis not present

## 2014-09-15 DIAGNOSIS — Z8619 Personal history of other infectious and parasitic diseases: Secondary | ICD-10-CM | POA: Diagnosis not present

## 2014-09-16 ENCOUNTER — Encounter (HOSPITAL_COMMUNITY): Payer: Self-pay | Admitting: Emergency Medicine

## 2014-09-16 ENCOUNTER — Emergency Department (HOSPITAL_COMMUNITY)
Admission: EM | Admit: 2014-09-16 | Discharge: 2014-09-16 | Disposition: A | Discharge status: Home / Self Care | Payer: Medicaid Other | Attending: Emergency Medicine | Admitting: Emergency Medicine

## 2014-09-16 DIAGNOSIS — M545 Low back pain: Secondary | ICD-10-CM

## 2014-09-16 DIAGNOSIS — G8929 Other chronic pain: Secondary | ICD-10-CM

## 2014-09-16 MED ORDER — METHOCARBAMOL 500 MG PO TABS: 500.0000 mg | ORAL_TABLET | Freq: Two times a day (BID) | ORAL | Status: DC

## 2014-09-16 MED ORDER — OXYCODONE-ACETAMINOPHEN 5-325 MG PO TABS: 1.0000 | ORAL_TABLET | Freq: Four times a day (QID) | ORAL | Status: DC | PRN

## 2014-09-16 MED ORDER — HYDROMORPHONE HCL 1 MG/ML IJ SOLN
2.0000 mg | Freq: Once | INTRAMUSCULAR | Status: AC
  Administered 2014-09-16: 2 mg via INTRAMUSCULAR
  Filled 2014-09-16: qty 2

## 2014-09-16 MED ORDER — PREDNISONE 20 MG PO TABS: ORAL_TABLET | ORAL | Status: DC

## 2014-09-16 MED ORDER — NAPROXEN 500 MG PO TABS: 500.0000 mg | ORAL_TABLET | Freq: Two times a day (BID) | ORAL | Status: DC

## 2014-09-16 MED ORDER — HYDROCODONE-ACETAMINOPHEN 5-325 MG PO TABS: 1.0000 | ORAL_TABLET | Freq: Four times a day (QID) | ORAL | Status: DC | PRN
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
## 2014-09-16 NOTE — Discharge Instructions (Signed)
Total Hip Replacement Total hip replacement is the replacement of your damaged hip with an artificial hip joint (prosthetic hip joint). The purpose of this surgery is to reduce pain and improve your hip function. LET [facility] CARE PROVIDER KNOW ABOUT:   Any allergies you have.  All medicines you are taking, including vitamins, herbs, eye drops, creams, and over-the-counter medicines.  Previous problems you or members of your family have had with the use of anesthetics.  Any blood disorders you have.  Previous surgeries you have had.  Medical conditions you have. RISKS AND COMPLICATIONS Generally, total hip replacement is a safe procedure. However, problems can occur and include:  Infection.  Dislocation (the ball of the hip-joint prosthesis comes out of contact with the socket).  Loosening of the stem connected to the ball or socket.  Fracture of the bone while inserting the prosthesis.  Formation of blood clots, which can break loose and travel to and injure your lungs (pulmonary embolus). BEFORE THE PROCEDURE   Do not eat or drink anything after midnight on the night before the procedure or as directed by your health care provider.  Ask your health care provider about changing or stopping your regular medicines. This is especially important if you are taking diabetes medicines or blood thinners. PROCEDURE  Just before the procedure, you will receive medicine that makes you drowsy (sedative) or a medicine that makes you fall asleep (general anesthetic). This will be given through a tube that is inserted into one of your veins (IV tube).  You will then receive a medicine injected into your spine that numbs your body below the waist (spinal anesthetic).  An incision is made in your hip. Your surgeon will take out any damaged cartilage and bone.  Next, your surgeon will insert a prosthetic socket into your pelvic bone. This is usually secured with screws.  Your surgeon will  then cut off the ball of your thigh bone (femur) and attach a prosthetic ball on a stem to your femur.  The surgeon will place the ball into the socket and check the range of motion of your new hip. AFTER THE PROCEDURE   You will be taken to a recovery area where a nurse will watch and check your progress.  Once you are awake and stable, you will be taken to a hospital room.  You will receive physical therapy until you are doing well and your health care provider feels it is safe for you to go home. Document Released: 06/07/2000 Document Revised: 07/16/2013 Document Reviewed: 05/02/2013 [facility] Patient Information 2015 [place], [place]. This information is not intended to replace advice given to you by your health care provider. Make sure you discuss any questions you have with your health care provider.   Back Pain, Adult Low back pain is very common. About 1 in 5 people have back pain.The cause of low back pain is rarely dangerous. The pain often gets better over time.About half of people with a sudden onset of back pain feel better in just 2 weeks. About 8 in 10 people feel better by 6 weeks.  CAUSES Some common causes of back pain include:  Strain of the muscles or ligaments supporting the spine.  Wear and tear (degeneration) of the spinal discs.  Arthritis.  Direct injury to the back. DIAGNOSIS Most of the time, the direct cause of low back pain is not known.However, back pain can be treated effectively even when the exact cause of the pain is unknown.Answering your  caregiver's questions about your overall health and symptoms is one of the most accurate ways to make sure the cause of your pain is not dangerous. If your caregiver needs more information, he or she may order lab work or imaging tests (X-rays or MRIs).However, even if imaging tests show changes in your back, this usually does not require surgery. HOME CARE INSTRUCTIONS For many people, back pain returns.Since low  back pain is rarely dangerous, it is often a condition that people can learn to [facility] their own.   Remain active. It is stressful on the back to sit or stand in one place. Do not sit, drive, or stand in one place for more than 30 minutes at a time. Take short walks on level surfaces as soon as pain allows.Try to increase the length of time you walk each day.  Do not stay in bed.Resting more than 1 or 2 days can delay your recovery.  Do not avoid exercise or work.Your body is made to move.It is not dangerous to be active, even though your back may hurt.Your back will likely heal faster if you return to being active before your pain is gone.  Pay attention to your body when you bend and lift. Many people have less discomfortwhen lifting if they bend their knees, keep the load close to their bodies,and avoid twisting. Often, the most comfortable positions are those that put less stress on your recovering back.  Find a comfortable position to sleep. Use a firm mattress and lie on your side with your knees slightly bent. If you lie on your back, put a pillow under your knees.  Only take over-the-counter or prescription medicines as directed by your caregiver. Over-the-counter medicines to reduce pain and inflammation are often the most helpful.Your caregiver may prescribe muscle relaxant drugs.These medicines help dull your pain so you can more quickly return to your normal activities and healthy exercise.  Put ice on the injured area.  Put ice in a plastic bag.  Place a towel between your skin and the bag.  Leave the ice on for 15-20 minutes, 03-04 times a day for the first 2 to 3 days. After that, ice and heat may be alternated to reduce pain and spasms.  Ask your caregiver about trying back exercises and gentle massage. This may be of some benefit.  Avoid feeling anxious or stressed.Stress increases muscle tension and can worsen back pain.It is important to recognize when you  are anxious or stressed and learn ways to manage it.Exercise is a great option. SEEK MEDICAL CARE IF:  You have pain that is not relieved with rest or medicine.  You have pain that does not improve in 1 week.  You have new symptoms.  You are generally not feeling well. SEEK IMMEDIATE MEDICAL CARE IF:   You have pain that radiates from your back into your legs.  You develop new bowel or bladder control problems.  You have unusual weakness or numbness in your arms or legs.  You develop nausea or vomiting.  You develop abdominal pain.  You feel faint. Document Released: 03/01/2005 Document Revised: 08/31/2011 Document Reviewed: 07/03/2013 [facility] Patient Information 2015 [place]. This information is not intended to replace advice given to you by your health care provider. Make sure you discuss any questions you have with your health care provider.
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
## 2014-12-21 ENCOUNTER — Emergency Department (HOSPITAL_COMMUNITY)
Admission: EM | Admit: 2014-12-21 | Discharge: 2014-12-21 | Disposition: A | Discharge status: Home / Self Care | Payer: Medicaid Other | Attending: Emergency Medicine | Admitting: Emergency Medicine

## 2014-12-21 DIAGNOSIS — I10 Essential (primary) hypertension: Secondary | ICD-10-CM | POA: Diagnosis not present

## 2014-12-21 DIAGNOSIS — Z791 Long term (current) use of non-steroidal anti-inflammatories (NSAID): Secondary | ICD-10-CM | POA: Diagnosis not present

## 2014-12-21 DIAGNOSIS — Z8742 Personal history of other diseases of the female genital tract: Secondary | ICD-10-CM | POA: Insufficient documentation

## 2014-12-21 DIAGNOSIS — Z720 Tobacco use: Secondary | ICD-10-CM | POA: Diagnosis not present

## 2014-12-21 DIAGNOSIS — Z79899 Other long term (current) drug therapy: Secondary | ICD-10-CM | POA: Insufficient documentation

## 2014-12-21 DIAGNOSIS — M79606 Pain in leg, unspecified: Secondary | ICD-10-CM | POA: Diagnosis present

## 2014-12-21 DIAGNOSIS — Z7952 Long term (current) use of systemic steroids: Secondary | ICD-10-CM | POA: Diagnosis not present

## 2014-12-21 DIAGNOSIS — G8929 Other chronic pain: Secondary | ICD-10-CM

## 2014-12-21 DIAGNOSIS — Z8619 Personal history of other infectious and parasitic diseases: Secondary | ICD-10-CM | POA: Diagnosis not present

## 2014-12-21 MED ORDER — ACETAMINOPHEN 500 MG PO TABS: 1000.0000 mg | ORAL_TABLET | Freq: Once | ORAL | Status: DC

## 2014-12-21 NOTE — ED Notes (Signed)
When this RN went to discharge pt, she was gone.  RN attempted to find pt for D/C instructions, medication and paperwork but was unable to fine pt.

## 2014-12-21 NOTE — ED Notes (Signed)
Went to PT room to give tylenol ,but was informed by staff that Pt walked out . Unable to assess Pt at this time.

## 2014-12-21 NOTE — ED Provider Notes (Signed)
CSN: [id number]     Arrival date & time 12/21/14  1330 History  By signing my name below, I, Soijett Blue, attest that this documentation has been prepared under the direction and in the presence of Roxy Horseman, PA-C Electronically Signed: Soijett Blue, ED Scribe. 12/21/2014. 2:44 PM.   Chief Complaint  Patient presents with  . Leg Pain  . Medication Refill     The history is provided by the patient. No language interpreter was used.    Amy Le is a 39 y.o. female with a medical hx of HTN who presents to the Emergency Department complaining of moderate chronic leg pain onset 2 days ago. She notes that she is out of her medication and has been out for 2 days and she has tried to stick it out. She notes that she takes pain medications for her chronic leg pain. She denies taking any additional medications for the relief of her symptoms. She denies color change, wound, rash, gait problem, and any other symptoms.    Past Medical History  Diagnosis Date  . Hypertension   . BV (bacterial vaginosis)   . Chlamydia   . Trichomonas    Past Surgical History  Procedure Laterality Date  . Tubal ligation    . Foot surgery     No family history on file. Social History  Substance Use Topics  . Smoking status: Current Every Day Smoker -- 0.00 packs/day    Types: Cigarettes  . Smokeless tobacco: Not on file  . Alcohol Use: No   OB History    No data available     Review of Systems  Musculoskeletal: Positive for arthralgias. Negative for gait problem.  Skin: Negative for color change, rash and wound.      Allergies  Review of patient's allergies indicates no known allergies.  Home Medications   Prior to Admission medications   Medication Sig Start Date End Date Taking? Authorizing Provider  acetaminophen (TYLENOL) 500 MG tablet Take 500 mg by mouth every 6 (six) hours as needed for mild pain or moderate pain.    Historical Provider, MD  methocarbamol (ROBAXIN) 500 MG  tablet Take 1 tablet (500 mg total) by mouth 2 (two) times daily. 09/16/14   Toy Cookey, MD  metroNIDAZOLE (FLAGYL) 250 MG tablet Take 1 tablet (250 mg total) by mouth 3 (three) times daily. Patient not taking: Reported on 09/16/2014 09/24/12   Linna Hoff, MD  metroNIDAZOLE (METROGEL VAGINAL) 0.75 % vaginal gel Place 1 Applicatorful vaginally 1 day or 1 dose. Nightly for 5 nights Patient not taking: Reported on 09/16/2014 09/24/12   Linna Hoff, MD  naproxen (NAPROSYN) 500 MG tablet Take 1 tablet (500 mg total) by mouth 2 (two) times daily with a meal. 09/16/14   Toy Cookey, MD  oxyCODONE-acetaminophen (PERCOCET) 5-325 MG per tablet Take 1 tablet by mouth every 6 (six) hours as needed. 09/16/14   Toy Cookey, MD  predniSONE (DELTASONE) 20 MG tablet 3 tabs po day one, then 2 po daily x 4 days 09/16/14   Toy Cookey, MD  traMADol (ULTRAM) 50 MG tablet Take 1 tablet (50 mg total) by mouth every 6 (six) hours as needed for pain. 09/06/12   Lowell Bouton, PA-C   BP 119/70 mmHg  Pulse 98  Temp(Src) 98.5 F (36.9 C) (Oral)  Resp 16  Ht  (1.676 m)  Wt 199 lb 9.6 oz (90.538 kg)  BMI 32.23 kg/m2  SpO2 99%  LMP 12/20/2014 Physical Exam  Constitutional: She is oriented to person, place, and time. She appears well-developed and well-nourished. No distress.  HENT:  Head: Normocephalic and atraumatic.  Eyes: Conjunctivae and EOM are normal.  Neck: Normal range of motion. Neck supple.  Cardiovascular: Normal rate, regular rhythm and normal heart sounds.  Exam reveals no gallop and no friction rub.   No murmur heard. Pulmonary/Chest: Effort normal and breath sounds normal. No respiratory distress. She has no wheezes. She has no rales.  Abdominal: She exhibits no distension.  Musculoskeletal: Normal range of motion.  Neurological: She is alert and oriented to person, place, and time.  Skin: Skin is warm and dry.  Psychiatric: She has a normal mood and affect. Her behavior is normal. Judgment  and thought content normal.  Nursing note and vitals reviewed.   ED Course  Procedures (including critical care time) DIAGNOSTIC STUDIES: Oxygen Saturation is 99% on RA, nl by my interpretation.    COORDINATION OF CARE: 2:42 PM Discussed treatment plan with pt at bedside which includes tylenol and f/u with PCP and pt agreed to plan.      MDM   Final diagnoses:  Chronic pain    Patient with chronic leg pain. She states that she is out of her Percocet and is requesting a refill. I informed the patient that is I cannot refill her medication, nor treat her chronic pain. This will need to be taken care of by her pain management specialist. Patient is disappointed, but understands. I will give the patient a dose of Tylenol prior to discharge. She is well-appearing, ambulatory, and not in any apparent distress.  Filed Vitals:   12/21/14 1449  BP: 112/76  Pulse: 85  Temp: 98.1 F (36.7 C)  Resp: 18     I, Zineb Glade, personally performed the services described in this documentation. All medical record entries made by the scribe were at my direction and in my presence.  I have reviewed the chart and discharge instructions and agree that the record reflects my personal performance and is accurate and complete. Catricia Scheerer.  12/21/2014. 3:12 PM.      Roxy Horseman, PA-C 12/21/14 1512  Nelva Nay, MD 12/22/14 [phone]

## 2014-12-21 NOTE — Discharge Instructions (Signed)
Chronic Pain Discharge Instructions  °Emergency care providers appreciate that many patients coming to us are in severe pain and we wish to address their pain in the safest, most responsible manner.  It is important to recognize however, that the proper treatment of chronic pain differs from that of the pain of injuries and acute illnesses.  Our goal is to provide quality, safe, personalized care and we thank you for giving us the opportunity to serve you. °The use of narcotics and related agents for chronic pain syndromes may lead to additional physical and psychological problems.  Nearly as many people die from prescription narcotics each year as die from car crashes.  Additionally, this risk is increased if such prescriptions are obtained from a variety of sources.  Therefore, only your primary care physician or a pain management specialist is able to safely treat such syndromes with narcotic medications long-term.   ° °Documentation revealing such prescriptions have been sought from multiple sources may prohibit us from providing a refill or different narcotic medication.  Your name may be checked first through the [place] Controlled Substances Reporting System.  This database is a record of controlled substance medication prescriptions that the patient has received.  This has been established by [employer] in an effort to eliminate the dangerous, and often life threatening, practice of obtaining multiple prescriptions from different medical providers.  ° °If you have a chronic pain syndrome (i.e. chronic headaches, recurrent back or neck pain, dental pain, abdominal or pelvis pain without a specific diagnosis, or neuropathic pain such as fibromyalgia) or recurrent visits for the same condition without an acute diagnosis, you may be treated with non-narcotics and other non-addictive medicines.  Allergic reactions or negative side effects that may be reported by a patient to such medications will not  typically lead to the use of a narcotic analgesic or other controlled substance as an alternative. °  °Patients managing chronic pain with a personal physician should have provisions in place for breakthrough pain.  If you are in crisis, you should call your physician.  If your physician directs you to the emergency department, please have the doctor call and speak to our attending physician concerning your care. °  °When patients come to the Emergency Department (ED) with acute medical conditions in which the Emergency Department physician feels appropriate to prescribe narcotic or sedating pain medication, the physician will prescribe these in very limited quantities.  The amount of these medications will last only until you can see your primary care physician in his/her office.  Any patient who returns to the ED seeking refills should expect only non-narcotic pain medications.  ° °In the event of an acute medical condition exists and the emergency physician feels it is necessary that the patient be given a narcotic or sedating medication -  a responsible adult driver should be present in the room prior to the medication being given by the nurse. °  °Prescriptions for narcotic or sedating medications that have been lost, stolen or expired will not be refilled in the Emergency Department.   ° °Patients who have chronic pain may receive non-narcotic prescriptions until seen by their primary care physician.  It is every patient’s personal responsibility to maintain active prescriptions with his or her primary care physician or specialist. °

## 2014-12-21 NOTE — ED Notes (Signed)
Pt. Stated, I have chronic leg pain and Im out of my medication

## 2015-03-16 HISTORY — PX: MULTIPLE TOOTH EXTRACTIONS: SHX2053

## 2015-09-28 ENCOUNTER — Encounter (HOSPITAL_COMMUNITY): Payer: Self-pay

## 2015-09-28 ENCOUNTER — Emergency Department (HOSPITAL_COMMUNITY)
Admission: EM | Admit: 2015-09-28 | Discharge: 2015-09-29 | Disposition: A | Discharge status: Other Acute Inpatient Hospital | Payer: Medicaid Other | Attending: Physician Assistant | Admitting: Physician Assistant

## 2015-09-28 DIAGNOSIS — F1414 Cocaine abuse with cocaine-induced mood disorder: Secondary | ICD-10-CM

## 2015-09-28 DIAGNOSIS — N9489 Other specified conditions associated with female genital organs and menstrual cycle: Secondary | ICD-10-CM | POA: Insufficient documentation

## 2015-09-28 DIAGNOSIS — F17210 Nicotine dependence, cigarettes, uncomplicated: Secondary | ICD-10-CM | POA: Insufficient documentation

## 2015-09-28 DIAGNOSIS — F1410 Cocaine abuse, uncomplicated: Secondary | ICD-10-CM | POA: Insufficient documentation

## 2015-09-28 DIAGNOSIS — R45851 Suicidal ideations: Secondary | ICD-10-CM | POA: Insufficient documentation

## 2015-09-28 DIAGNOSIS — F1290 Cannabis use, unspecified, uncomplicated: Secondary | ICD-10-CM | POA: Insufficient documentation

## 2015-09-28 DIAGNOSIS — F411 Generalized anxiety disorder: Secondary | ICD-10-CM

## 2015-09-28 DIAGNOSIS — Z79899 Other long term (current) drug therapy: Secondary | ICD-10-CM | POA: Insufficient documentation

## 2015-09-28 DIAGNOSIS — F29 Unspecified psychosis not due to a substance or known physiological condition: Secondary | ICD-10-CM | POA: Insufficient documentation

## 2015-09-28 DIAGNOSIS — I10 Essential (primary) hypertension: Secondary | ICD-10-CM | POA: Insufficient documentation

## 2015-09-28 LAB — CBC WITH DIFFERENTIAL/PLATELET
BASOS PCT: 1 %
Basophils Absolute: 0.1 10*3/uL (ref 0.0–0.1)
Eosinophils Absolute: 0.2 10*3/uL (ref 0.0–0.7)
Eosinophils Relative: 3 %
HEMATOCRIT: 39.6 % (ref 36.0–46.0)
HEMOGLOBIN: 13.5 g/dL (ref 12.0–15.0)
Lymphocytes Relative: 41 %
Lymphs Abs: 3.1 10*3/uL (ref 0.7–4.0)
MCH: 29.8 pg (ref 26.0–34.0)
MCHC: 34.1 g/dL (ref 30.0–36.0)
MCV: 87.4 fL (ref 78.0–100.0)
Monocytes Absolute: 0.8 10*3/uL (ref 0.1–1.0)
Monocytes Relative: 10 %
NEUTROS ABS: 3.4 10*3/uL (ref 1.7–7.7)
NEUTROS PCT: 45 %
Platelets: 248 10*3/uL (ref 150–400)
RBC: 4.53 MIL/uL (ref 3.87–5.11)
RDW: 13.8 % (ref 11.5–15.5)
WBC: 7.6 10*3/uL (ref 4.0–10.5)

## 2015-09-28 LAB — BASIC METABOLIC PANEL
ANION GAP: 9 (ref 5–15)
BUN: 22 mg/dL — ABNORMAL HIGH (ref 6–20)
CALCIUM: 9.2 mg/dL (ref 8.9–10.3)
CO2: 23 mmol/L (ref 22–32)
Chloride: 107 mmol/L (ref 101–111)
Creatinine, Ser: 0.92 mg/dL (ref 0.44–1.00)
GFR calc non Af Amer: >60 mL/min (ref >60)
Glucose, Bld: 102 mg/dL — ABNORMAL HIGH (ref 65–99)
Potassium: 3 mmol/L — ABNORMAL LOW (ref 3.5–5.1)
SODIUM: 139 mmol/L (ref 135–145)

## 2015-09-28 LAB — ETHANOL: Alcohol, Ethyl (B): <5 mg/dL (ref <5)

## 2015-09-28 LAB — I-STAT BETA HCG BLOOD, ED (MC, WL, AP ONLY): I-stat hCG, quantitative: <5 m[IU]/mL (ref <5)

## 2015-09-28 LAB — SALICYLATE LEVEL

## 2015-09-28 LAB — ACETAMINOPHEN LEVEL

## 2015-09-28 MED ORDER — LORAZEPAM 1 MG PO TABS
1.0000 mg | ORAL_TABLET | Freq: Once | ORAL | Status: AC
  Administered 2015-09-28: 1 mg via ORAL
  Filled 2015-09-28: qty 1

## 2015-09-28 MED ORDER — POTASSIUM CHLORIDE CRYS ER 20 MEQ PO TBCR
60.0000 meq | EXTENDED_RELEASE_TABLET | Freq: Once | ORAL | Status: AC
  Administered 2015-09-28: 60 meq via ORAL
  Filled 2015-09-28: qty 3

## 2015-09-28 NOTE — BH Assessment (Addendum)
Assessment Note  Amy Le is an 40 y.o. female presenting voluntarily to [facility] for panic attacks and anxiety. Patient states that she was at the store and someone called 911 due to her having a panic attack. Patient states that she cannot remember if she asked them to call or not. Patient states that she has had anxiety attacks before but she cannot recall when was the last time or when they started. Patient denies SI and history of attempts. Patient denies self injurious behaviors. Patient denies HI and history of aggression. Patient denies access to firearms. Patient denies recent legal charges. Patient denies AVH. Patient states that she needs her medication but she cannot recall the name of the medication or who prescribed the medication. Patient states that she uses cocaine "occassionally" and last used $60 worth of cocaine "a couple days ago." Patient denies use of other substances. Patient denies use of alcohol. Patient BAL <5 and UDS +cocaine, + THC, + Benzodiazepines. .   Consulted with Malachy Chamber, NP who recommends treatment in the observation unit once patient is medically cleared.   Diagnosis: Polysubstance Induced Mood Disorder  Past Medical History:  Past Medical History  Diagnosis Date  . Hypertension   . BV (bacterial vaginosis)   . Chlamydia   . Trichomonas     Past Surgical History  Procedure Laterality Date  . Tubal ligation    . Foot surgery      Family History: No family history on file.  Social History:  reports that she has been smoking Cigarettes.  She has been smoking about 0.50 packs per day. She does not have any smokeless tobacco history on file. She reports that she uses illicit drugs (Cocaine). She reports that she does not drink alcohol.  Additional Social History:  Alcohol / Drug Use Pain Medications: Denies Prescriptions: Denies Over the Counter: Denies History of alcohol / drug use?: Yes Longest period of sobriety (when/how long): 6  years Substance #1 Name of Substance 1: Cocaine 1 - Age of First Use: "19 someth[age]" 1 - Amount (size/oz): $60 worth 1 - Frequency: "recreational" 1 - Duration: ongoing 1 - Last Use / Amount: "couple days ago"  CIWA: CIWA-Ar BP: 139/75 mmHg Pulse Rate: 98 COWS:    Allergies: No Known Allergies  Home Medications:  (Not in a hospital admission)  OB/GYN Status:  Patient's last menstrual period was 09/28/2015 (exact date).  General Assessment Data [facility] of [facility]: [facility] TTS Assessment: In system Is this a Tele or Face-to-Face Assessment?: Face-to-Face Is this an Initial Assessment or a Re-assessment for this encounter?: Initial Assessment Marital status: Single Is patient pregnant?: No Pregnancy Status: No Living Arrangements: Other (Comment) ("I don't know") Admission Status: Voluntary Is patient capable of signing voluntary admission?: Yes Referral Source: Self/Family/Friend     Crisis Care Plan Living Arrangements: Other (Comment) ("I don't know") Name of Psychiatrist: None Name of Therapist: None  Education Status Is patient currently in school?: No Highest grade of school patient has completed: 12  Risk to self with the past 6 months Suicidal Ideation: No Has patient been a risk to self within the past 6 months prior to admission? : No Suicidal Intent: No Has patient had any suicidal intent within the past 6 months prior to admission? : No Is patient at risk for suicide?: No Suicidal Plan?: No Has patient had any suicidal plan within the past 6 months prior to admission? : No Access to Means: No What has been your use of drugs/alcohol  within the last 12 months?: cocaine Previous Attempts/Gestures: No How many times?: 0 Other Self Harm Risks: Denies Triggers for Past Attempts: None known Intentional Self Injurious Behavior: None Family Suicide History: No Recent stressful life event(s): Other (Comment) ("the way i feel, I need some medicine to take  the edge off") Persecutory voices/beliefs?: No Depression: Yes Depression Symptoms: Isolating, Feeling angry/irritable Substance abuse history and/or treatment for substance abuse?: Yes Suicide prevention information given to non-admitted patients: Not applicable  Risk to Others within the past 6 months Homicidal Ideation: No Does patient have any lifetime risk of violence toward others beyond the six months prior to admission? : No Thoughts of Harm to Others: No Current Homicidal Intent: No Current Homicidal Plan: No Access to Homicidal Means: No Identified Victim: Denies History of harm to others?: No Assessment of Violence: None Noted Violent Behavior Description: Denies Does patient have access to weapons?: No Criminal Charges Pending?: No Does patient have a court date: No Is patient on probation?: No  Psychosis Hallucinations: None noted Delusions: None noted  Mental Status Report Appearance/Hygiene: In scrubs Eye Contact: Fair Motor Activity: Restlessness Speech: Logical/coherent Level of Consciousness: Restless Mood: Anxious Affect: Anxious Anxiety Level: Panic Attacks Panic attack frequency: UKN Most recent panic attack: Today Thought Processes: Coherent, Relevant Judgement: Partial Orientation: Person Obsessive Compulsive Thoughts/Behaviors: None  Cognitive Functioning Concentration: Fair Memory: Recent Intact, Remote Intact IQ: Average Insight: Fair Impulse Control: Fair Appetite: Good Sleep: Unable to Assess Vegetative Symptoms: None  ADLScreening [facility] Assessment Services) Patient's cognitive ability adequate to safely complete daily activities?: Yes Patient able to express need for assistance with ADLs?: Yes Independently performs ADLs?: Yes (appropriate for developmental age)  Prior Inpatient Therapy Prior Inpatient Therapy: Yes Prior Therapy Dates: 2017 Prior Therapy Facilty/Provider(s): [facility]  Prior Outpatient Therapy Prior Outpatient  Therapy: Yes Prior Therapy Dates: "a couple months ago" Prior Therapy Facilty/Provider(s): "I can't remember" Reason for Treatment: "all types of stuff" Does patient have an ACCT team?: No Does patient have Intensive In-House Services?  : No Does patient have [facility] services? : No Does patient have P4CC services?: No  ADL Screening (condition at time of admission) Patient's cognitive ability adequate to safely complete daily activities?: Yes Is the patient deaf or have difficulty hearing?: No Does the patient have difficulty seeing, even when wearing glasses/contacts?: No Does the patient have difficulty concentrating, remembering, or making decisions?: No Patient able to express need for assistance with ADLs?: Yes Does the patient have difficulty dressing or bathing?: No Independently performs ADLs?: Yes (appropriate for developmental age) Does the patient have difficulty walking or climbing stairs?: No Weakness of Legs: None Weakness of Arms/Hands: None  Home Assistive Devices/Equipment Home Assistive Devices/Equipment: None  Therapy Consults (therapy consults require a physician order) PT Evaluation Needed: No OT Evalulation Needed: No SLP Evaluation Needed: No Abuse/Neglect Assessment (Assessment to be complete while patient is alone) Physical Abuse: Denies Verbal Abuse: Denies Sexual Abuse: Denies Exploitation of patient/patient's resources: Denies Self-Neglect: Denies Values / Beliefs Cultural Requests During Hospitalization: None Spiritual Requests During Hospitalization: None Consults Spiritual Care Consult Needed: No Social Work Consult Needed: No Merchant navy officerAdvance Directives (For Healthcare) Does patient have an advance directive?: No Would patient like information on creating an advanced directive?: No - patient declined information    Additional Information 1:1 In Past 12 Months?: No CIRT Risk: No Elopement Risk: No Does patient have medical clearance?: No      Disposition:  Disposition Initial Assessment Completed for this Encounter: Yes Disposition of Patient:  Other dispositions (observation unit per Malachy Chamber, NP) Other disposition(s): Other (Comment)  On Site Evaluation by:   Reviewed with Physician:    Aracelli Woloszyn 09/29/2015 4:37 AM

## 2015-09-28 NOTE — ED Notes (Signed)
Pt. Informed that we need a urine sample as soon as possible.

## 2015-09-28 NOTE — ED Notes (Signed)
Pt. Transferred to [facility] from ED to [facility]. [facility] Pt. Oriented to unit including Q15 minute rounds as well as the security cameras for their protection. Patient is alert and oriented, warm and dry in no acute distress. Patient denies VH. Pt. States not sure about SI/HI and yes [facility] "[facility]".  Encouraged to let me know if needs arise.

## 2015-09-28 NOTE — ED Notes (Signed)
Pt states that she cannot urinate at this time.

## 2015-09-28 NOTE — ED Notes (Signed)
Patient states that is out of her mental health medications.  Patient also stated that she has been using cocaine everyday since beginning of this week.  Patient states that last used yesterday.

## 2015-09-28 NOTE — ED Provider Notes (Signed)
CSN: [id number]     Arrival date & time 09/28/15  1922 History  By signing my name below, I, Bridgette Habermann, attest that this documentation has been prepared under the direction and in the presence of Falcon Mccaskey, [place]. Electronically Signed: Bridgette Habermann, ED Scribe. 09/28/2015. 8:15 PM.   Chief Complaint  Patient presents with  . Anxiety   The history is provided by the patient. No language interpreter was used.    HPI Comments: Amy Le is a 40 y.o. female with h/o anxiety and cocaine abuse who presents to the Emergency Department by EMS complaining of sudden onset anxiety attack onset one hour ago. Pt felt shortness of breath and reports she had trouble breathing. Pt also has associated nausea. Pt notes she has h/o anxiety and this feels similar to past anxiety attacks. Pt states that she is also suicidal but does not have a specific plan. She has tried to commit suicide before. Pt notes she is hearing voices. Denies HI. Pt has h/o cocaine use, her last use was a couple days ago. Pt denies any other drug use or alcohol use. Pt denies chance of being pregnant. Pt denies chest pain, leg pain/swelling, abdominal pain, diarrhea, hallucinations. She told triage staff she is out of home meds but told me she has them at home and has been taking as directed. She states she is taking xanax, seroquel, and something else but she cannot remember the name or any dosages.  Past Medical History  Diagnosis Date  . Hypertension   . BV (bacterial vaginosis)   . Chlamydia   . Trichomonas    Past Surgical History  Procedure Laterality Date  . Tubal ligation    . Foot surgery     No family history on file. Social History  Substance Use Topics  . Smoking status: Current Every Day Smoker -- 0.50 packs/day    Types: Cigarettes  . Smokeless tobacco: None  . Alcohol Use: No   OB History    No data available     Review of Systems A complete 10 system review of systems was obtained and all systems are  negative except as noted in the HPI and PMH.    Allergies  Review of patient's allergies indicates no known allergies.  Home Medications   Prior to Admission medications   Medication Sig Start Date End Date Taking? Authorizing Provider  acetaminophen (TYLENOL) 500 MG tablet Take 500 mg by mouth every 6 (six) hours as needed for mild pain or moderate pain.    Historical Provider, MD  methocarbamol (ROBAXIN) 500 MG tablet Take 1 tablet (500 mg total) by mouth 2 (two) times daily. 09/16/14   Toy Cookey, MD  metroNIDAZOLE (FLAGYL) 250 MG tablet Take 1 tablet (250 mg total) by mouth 3 (three) times daily. Patient not taking: Reported on 09/16/2014 09/24/12   Linna Hoff, MD  metroNIDAZOLE (METROGEL VAGINAL) 0.75 % vaginal gel Place 1 Applicatorful vaginally 1 day or 1 dose. Nightly for 5 nights Patient not taking: Reported on 09/16/2014 09/24/12   Linna Hoff, MD  naproxen (NAPROSYN) 500 MG tablet Take 1 tablet (500 mg total) by mouth 2 (two) times daily with a meal. 09/16/14   Toy Cookey, MD  oxyCODONE-acetaminophen (PERCOCET) 5-325 MG per tablet Take 1 tablet by mouth every 6 (six) hours as needed. 09/16/14   Toy Cookey, MD  predniSONE (DELTASONE) 20 MG tablet 3 tabs po day one, then 2 po daily x 4 days 09/16/14   [facility]  Micheline Mazeocherty, MD  traMADol (ULTRAM) 50 MG tablet Take 1 tablet (50 mg total) by mouth every 6 (six) hours as needed for pain. 09/06/12   Lowell BoutonBarbara A Beck, PA-C   BP 127/89 mmHg  Pulse 107  Temp(Src) 98.3 F (36.8 C) (Oral)  Resp 16  SpO2 97%  LMP 09/28/2015 (Exact Date) Physical Exam  Constitutional: She appears well-developed and well-nourished.  HENT:  Head: Normocephalic.  Eyes: Conjunctivae are normal.  Cardiovascular: Normal rate.   Pulmonary/Chest: Effort normal. No respiratory distress.  Abdominal: She exhibits no distension.  Musculoskeletal: Normal range of motion.  Neurological: She is alert.  Skin: Skin is warm and dry.  Psychiatric: Her behavior is normal.  Her mood appears anxious.  Nursing note and vitals reviewed.   ED Course  Procedures  DIAGNOSTIC STUDIES: Oxygen Saturation is 97% on RA, adequate by my interpretation.    COORDINATION OF CARE: 8:09 PM Discussed treatment plan with pt at bedside which includes urinalysis and blood work and pt agreed to plan.  Labs Review Labs Reviewed  ACETAMINOPHEN LEVEL - Abnormal; Notable for the following:    Acetaminophen (Tylenol), Serum <10 (*)    All other components within normal limits  BASIC METABOLIC PANEL - Abnormal; Notable for the following:    Potassium 3.0 (*)    Glucose, Bld 102 (*)    BUN 22 (*)    All other components within normal limits  ETHANOL  CBC WITH DIFFERENTIAL/PLATELET  SALICYLATE LEVEL  URINE RAPID DRUG SCREEN, HOSP PERFORMED  URINALYSIS, ROUTINE W REFLEX MICROSCOPIC (NOT AT [facility])  I-STAT BETA HCG BLOOD, ED ([facility], [facility], [facility] ONLY)   I have personally reviewed and evaluated these lab results as part of my medical decision-making.  MDM   Final diagnoses:  Anxiety state  Psychosis, unspecified psychosis type  Cocaine abuse  Suicidal ideation    Pt is an 40 y.o. female with history of anxiety and cocaine abuse who presents to the ED after a suspected anxiety attack, now resolved on its own. Doubt ACS or PE. She does endorse recent cocaine use. She states she is currently suicidal and having auditory hallucinations. On EMR review in Care Everywhere pt does have a long history of cocaine abuse and multiple ED/psychiatric presentations. She is not well known to our system. She is currently medically clear. Will place in psych hold and consult TTS. I did not reorder home meds at this time as pt cannot provide list of current meds and med rec is pending.   [facility]   I personally performed the services described in this documentation, which was scribed in my presence. The recorded information has been reviewed and is accurate.   Carlene CoriaSerena Y Guliana Weyandt,  PA-C 09/28/15 2250  Courteney Randall AnLyn Mackuen, MD 09/29/15 [phone]

## 2015-09-28 NOTE — ED Notes (Signed)
Per EMS pt is c/o anxiety attack d/t being out of "mental health medication."  Pt reported to being in a mental health facility in [place], could not tell EMS when this was.  Pt is ambulatory in triage.  NAD.

## 2015-09-29 ENCOUNTER — Inpatient Hospital Stay (HOSPITAL_COMMUNITY)
Admission: AD | Admit: 2015-09-29 | Discharge: 2015-10-08 | DRG: 897 | Disposition: A | Discharge status: Home / Self Care | Payer: Medicaid Other | Source: Intra-hospital | Attending: Psychiatry | Admitting: Psychiatry

## 2015-09-29 ENCOUNTER — Encounter (HOSPITAL_COMMUNITY): Payer: Self-pay | Admitting: *Deleted

## 2015-09-29 DIAGNOSIS — I10 Essential (primary) hypertension: Secondary | ICD-10-CM | POA: Diagnosis present

## 2015-09-29 DIAGNOSIS — F1414 Cocaine abuse with cocaine-induced mood disorder: Secondary | ICD-10-CM | POA: Diagnosis present

## 2015-09-29 DIAGNOSIS — R45851 Suicidal ideations: Secondary | ICD-10-CM | POA: Diagnosis present

## 2015-09-29 DIAGNOSIS — G4700 Insomnia, unspecified: Secondary | ICD-10-CM | POA: Diagnosis present

## 2015-09-29 DIAGNOSIS — Z79899 Other long term (current) drug therapy: Secondary | ICD-10-CM

## 2015-09-29 DIAGNOSIS — F14251 Cocaine dependence with cocaine-induced psychotic disorder with hallucinations: Secondary | ICD-10-CM | POA: Diagnosis present

## 2015-09-29 DIAGNOSIS — F1423 Cocaine dependence with withdrawal: Secondary | ICD-10-CM | POA: Diagnosis present

## 2015-09-29 DIAGNOSIS — F329 Major depressive disorder, single episode, unspecified: Secondary | ICD-10-CM | POA: Diagnosis present

## 2015-09-29 DIAGNOSIS — N390 Urinary tract infection, site not specified: Secondary | ICD-10-CM | POA: Diagnosis present

## 2015-09-29 DIAGNOSIS — F17210 Nicotine dependence, cigarettes, uncomplicated: Secondary | ICD-10-CM | POA: Diagnosis present

## 2015-09-29 DIAGNOSIS — F1424 Cocaine dependence with cocaine-induced mood disorder: Principal | ICD-10-CM | POA: Diagnosis present

## 2015-09-29 DIAGNOSIS — F1494 Cocaine use, unspecified with cocaine-induced mood disorder: Secondary | ICD-10-CM | POA: Diagnosis present

## 2015-09-29 DIAGNOSIS — F419 Anxiety disorder, unspecified: Secondary | ICD-10-CM | POA: Diagnosis present

## 2015-09-29 LAB — URINALYSIS, ROUTINE W REFLEX MICROSCOPIC
BILIRUBIN URINE: NEGATIVE
Glucose, UA: NEGATIVE mg/dL
KETONES UR: NEGATIVE mg/dL
NITRITE: POSITIVE — AB
PH: 6 (ref 5.0–8.0)
PROTEIN: 30 mg/dL — AB
Specific Gravity, Urine: 1.035 — ABNORMAL HIGH (ref 1.005–1.030)

## 2015-09-29 LAB — URINE MICROSCOPIC-ADD ON

## 2015-09-29 LAB — RAPID URINE DRUG SCREEN, HOSP PERFORMED
Amphetamines: NOT DETECTED
Barbiturates: NOT DETECTED
Benzodiazepines: POSITIVE — AB
Cocaine: POSITIVE — AB
Opiates: NOT DETECTED
TETRAHYDROCANNABINOL: POSITIVE — AB

## 2015-09-29 MED ORDER — HYDROXYZINE HCL 25 MG PO TABS
25.0000 mg | ORAL_TABLET | Freq: Four times a day (QID) | ORAL | Status: DC | PRN
  Administered 2015-09-30: 25 mg via ORAL
  Filled 2015-09-29: qty 1

## 2015-09-29 MED ORDER — QUETIAPINE FUMARATE 400 MG PO TABS
400.0000 mg | ORAL_TABLET | Freq: Every day | ORAL | Status: DC
  Administered 2015-09-29 – 2015-10-07 (×10): 400 mg via ORAL
  Filled 2015-09-29 (×4): qty 1
  Filled 2015-09-29: qty 2
  Filled 2015-09-29 (×5): qty 1

## 2015-09-29 MED ORDER — SERTRALINE HCL 50 MG PO TABS
100.0000 mg | ORAL_TABLET | Freq: Every day | ORAL | Status: DC
  Administered 2015-09-29: 100 mg via ORAL
  Filled 2015-09-29: qty 2

## 2015-09-29 MED ORDER — POTASSIUM CHLORIDE CRYS ER 20 MEQ PO TBCR
20.0000 meq | EXTENDED_RELEASE_TABLET | Freq: Two times a day (BID) | ORAL | Status: AC
  Administered 2015-09-30 – 2015-10-01 (×4): 20 meq via ORAL
  Filled 2015-09-29 (×5): qty 1

## 2015-09-29 MED ORDER — LOPERAMIDE HCL 2 MG PO CAPS: 2.0000 mg | ORAL_CAPSULE | ORAL | Status: AC | PRN

## 2015-09-29 MED ORDER — ONDANSETRON 4 MG PO TBDP: 4.0000 mg | ORAL_TABLET | Freq: Four times a day (QID) | ORAL | Status: AC | PRN

## 2015-09-29 MED ORDER — QUETIAPINE FUMARATE 100 MG PO TABS
100.0000 mg | ORAL_TABLET | Freq: Two times a day (BID) | ORAL | Status: DC
  Administered 2015-09-29: 100 mg via ORAL
  Filled 2015-09-29: qty 1

## 2015-09-29 MED ORDER — GABAPENTIN 400 MG PO CAPS
400.0000 mg | ORAL_CAPSULE | Freq: Four times a day (QID) | ORAL | Status: DC
  Administered 2015-09-29 – 2015-10-02 (×11): 400 mg via ORAL
  Filled 2015-09-29 (×15): qty 1

## 2015-09-29 MED ORDER — DICYCLOMINE HCL 20 MG PO TABS: 20.0000 mg | ORAL_TABLET | Freq: Four times a day (QID) | ORAL | Status: AC | PRN

## 2015-09-29 MED ORDER — ACETAMINOPHEN 500 MG PO TABS
500.0000 mg | ORAL_TABLET | Freq: Four times a day (QID) | ORAL | Status: DC | PRN
Start: 2015-09-29 — End: 2015-09-29

## 2015-09-29 MED ORDER — ALUM & MAG HYDROXIDE-SIMETH 200-200-20 MG/5ML PO SUSP
30.0000 mL | ORAL | Status: DC | PRN
  Administered 2015-10-06: 30 mL via ORAL
  Filled 2015-09-29: qty 30

## 2015-09-29 MED ORDER — MAGNESIUM HYDROXIDE 400 MG/5ML PO SUSP: 30.0000 mL | Freq: Every day | ORAL | Status: DC | PRN

## 2015-09-29 MED ORDER — NAPROXEN 500 MG PO TABS
500.0000 mg | ORAL_TABLET | Freq: Two times a day (BID) | ORAL | Status: AC | PRN
  Administered 2015-09-30 – 2015-10-03 (×4): 500 mg via ORAL
  Filled 2015-09-29 (×4): qty 1

## 2015-09-29 MED ORDER — ACETAMINOPHEN 500 MG PO TABS: 500.0000 mg | ORAL_TABLET | Freq: Four times a day (QID) | ORAL | Status: DC | PRN

## 2015-09-29 MED ORDER — TRAZODONE HCL 50 MG PO TABS
50.0000 mg | ORAL_TABLET | Freq: Every day | ORAL | Status: DC
  Administered 2015-09-29 – 2015-09-30 (×2): 50 mg via ORAL
  Filled 2015-09-29 (×3): qty 1

## 2015-09-29 MED ORDER — ACETAMINOPHEN 325 MG PO TABS
650.0000 mg | ORAL_TABLET | Freq: Four times a day (QID) | ORAL | Status: DC | PRN
  Administered 2015-09-29: 650 mg via ORAL
  Filled 2015-09-29: qty 2

## 2015-09-29 MED ORDER — TRAZODONE HCL 50 MG PO TABS: 50.0000 mg | ORAL_TABLET | Freq: Every day | ORAL | Status: DC

## 2015-09-29 MED ORDER — QUETIAPINE FUMARATE 300 MG PO TABS: 400.0000 mg | ORAL_TABLET | Freq: Every day | ORAL | Status: DC

## 2015-09-29 MED ORDER — SERTRALINE HCL 100 MG PO TABS
100.0000 mg | ORAL_TABLET | Freq: Every day | ORAL | Status: DC
  Administered 2015-09-30 – 2015-10-01 (×2): 100 mg via ORAL
  Filled 2015-09-29 (×3): qty 1

## 2015-09-29 MED ORDER — GABAPENTIN 400 MG PO CAPS
400.0000 mg | ORAL_CAPSULE | Freq: Four times a day (QID) | ORAL | Status: DC
Start: 2015-09-29 — End: 2015-09-29
  Administered 2015-09-29 (×2): 400 mg via ORAL
  Filled 2015-09-29 (×2): qty 1

## 2015-09-29 MED ORDER — METHOCARBAMOL 500 MG PO TABS
500.0000 mg | ORAL_TABLET | Freq: Three times a day (TID) | ORAL | Status: AC | PRN
  Administered 2015-09-30 – 2015-10-04 (×4): 500 mg via ORAL
  Filled 2015-09-29 (×4): qty 1

## 2015-09-29 MED ORDER — QUETIAPINE FUMARATE 100 MG PO TABS
100.0000 mg | ORAL_TABLET | Freq: Two times a day (BID) | ORAL | Status: DC
  Administered 2015-09-30 – 2015-10-07 (×16): 100 mg via ORAL
  Filled 2015-09-29 (×19): qty 1

## 2015-09-29 NOTE — ED Notes (Signed)
Patient noted in room. No complaints, stable, in no acute distress. Q15 minute rounds and monitoring via Security Cameras to continue.  

## 2015-09-29 NOTE — Progress Notes (Signed)
Pt has complained since the beginning of shift change.  "Amy OmanYall dont give me nothin to drink".  "whats the milligram strength of all my meds?".  "when i get all my meds?", "Yall aint givin me my meds right".  "I want to change the TV channel", "Were are the other snacks?  I dont like these."  "Where is the doctor, go get him."  Pt constantly complaining about staff.  Pt up wandering around unit.  Pt is moderate fall risk. Pt is redirected to put socks back on.  Pt moderate fall risk but is not compliant in risk reduction directions.  Pt given snacks that unit has and given sodas. Pt remains safe on unit.

## 2015-09-29 NOTE — ED Notes (Signed)
Patient noted sleeping in room. No complaints, stable, in no acute distress. Q15 minute rounds and monitoring via Security Cameras to continue.  

## 2015-09-29 NOTE — BH Assessment (Signed)
[facility] Assessment Progress Note  Per morning shift report, this pt [facility] by Shon Hougharkia Starkes.  Pt has signed Voluntary Admission and Consent for Treatment, as well as Consent to Release Information to no one, and signed [facility]  Pt's nurse, Diane, has been notified, and agrees to send original paperwork along with pt via [facility], and to call report to [phone] or [phone].  Please note that pt reports that she has an outpatient provider, but explicitly refuses to consent to release information to them.  Doylene Canninghomas Meris Reede, MA Triage Specialist [phone]

## 2015-09-29 NOTE — Tx Team (Signed)
Initial Interdisciplinary Treatment Plan   PATIENT STRESSORS: Financial difficulties Substance abuse   PATIENT STRENGTHS: Average or above average intelligence Capable of independent living Communication skills   PROBLEM LIST: Problem List/Patient Goals Date to be addressed Date deferred Reason deferred Estimated date of resolution  Substance abuse      Suicidal Ideatioon                                                 DISCHARGE CRITERIA:  Improved stabilization in mood, thinking, and/or behavior  PRELIMINARY DISCHARGE PLAN: Outpatient therapy  PATIENT/FAMIILY INVOLVEMENT: This treatment plan has been presented to and reviewed with the patient, Amy Le, and/or family member, .  The patient and family have been given the opportunity to ask questions and make suggestions.  Shelia MediaJones, Amy Le 09/29/2015, 5:56 PM

## 2015-09-29 NOTE — ED Notes (Signed)
Patient noted in hall. No complaints, stable, in no acute distress. Q15 minute rounds and monitoring via Security Cameras to continue. 

## 2015-09-29 NOTE — Progress Notes (Signed)
[facility] for suicidal ideation and drug abuse. Patient reports fell out in store and someone called EMS and she ended up here. Patient reports she is currently suicidal with no plan and verbally contracts for safety. Patient reports, "I just dont wanna live anymore". Pt is poor historian. Reports she is unemployed, has 5 kids that live [place] someone else [place]. Reports frequent use of crack cocaine. Pt stressor she reports is family, and financial but would not elaborate. Endorses auditory hallucinations telling her to harm herself, but denies visual hallucinations. Requesting inpatient placement.  Fluids and nutrition offered. Skin and contraband search completed. Items stored in locker number 26. Pt remains under constant observation except while in bathroom.

## 2015-09-29 NOTE — ED Notes (Signed)
Pt has been calm and cooperative with appropriate behavior. She is looking forward to going to the Observation Unit. Have tried to call [facility] several times, but they are not answering the telephone this morning.

## 2015-09-29 NOTE — BH Assessment (Addendum)
Assessment completed. Consulted with Malachy Chamberakia Starkes, NP who recommends patient meets criteria for treatment in the observation unit once medically cleared with UDS results.   Davina PokeJoVea Nathan Stallworth, LCSW Therapeutic Triage Specialist [facility] 09/29/2015 1:04 AM

## 2015-09-29 NOTE — ED Notes (Signed)
Pt transported to [facility] by [facility]. All belongings returned to pt who signed for same.  

## 2015-09-30 DIAGNOSIS — I10 Essential (primary) hypertension: Secondary | ICD-10-CM | POA: Diagnosis present

## 2015-09-30 DIAGNOSIS — F1424 Cocaine dependence with cocaine-induced mood disorder: Secondary | ICD-10-CM | POA: Diagnosis present

## 2015-09-30 DIAGNOSIS — R45851 Suicidal ideations: Secondary | ICD-10-CM | POA: Diagnosis present

## 2015-09-30 DIAGNOSIS — N390 Urinary tract infection, site not specified: Secondary | ICD-10-CM | POA: Diagnosis present

## 2015-09-30 DIAGNOSIS — G4700 Insomnia, unspecified: Secondary | ICD-10-CM | POA: Diagnosis present

## 2015-09-30 DIAGNOSIS — F1414 Cocaine abuse with cocaine-induced mood disorder: Secondary | ICD-10-CM

## 2015-09-30 DIAGNOSIS — F17210 Nicotine dependence, cigarettes, uncomplicated: Secondary | ICD-10-CM | POA: Diagnosis present

## 2015-09-30 DIAGNOSIS — F1423 Cocaine dependence with withdrawal: Secondary | ICD-10-CM | POA: Diagnosis present

## 2015-09-30 DIAGNOSIS — Z79899 Other long term (current) drug therapy: Secondary | ICD-10-CM | POA: Diagnosis not present

## 2015-09-30 DIAGNOSIS — F329 Major depressive disorder, single episode, unspecified: Secondary | ICD-10-CM | POA: Diagnosis present

## 2015-09-30 DIAGNOSIS — F419 Anxiety disorder, unspecified: Secondary | ICD-10-CM | POA: Diagnosis present

## 2015-09-30 MED ORDER — HYDROXYZINE HCL 50 MG PO TABS
50.0000 mg | ORAL_TABLET | Freq: Four times a day (QID) | ORAL | Status: AC | PRN
  Administered 2015-09-30 – 2015-10-04 (×6): 50 mg via ORAL
  Filled 2015-09-30 (×7): qty 1

## 2015-09-30 MED ORDER — NITROFURANTOIN MONOHYD MACRO 100 MG PO CAPS
100.0000 mg | ORAL_CAPSULE | Freq: Two times a day (BID) | ORAL | Status: AC
  Administered 2015-09-30 – 2015-10-06 (×14): 100 mg via ORAL
  Filled 2015-09-30 (×14): qty 1

## 2015-09-30 MED ORDER — CEPHALEXIN 500 MG PO CAPS
500.0000 mg | ORAL_CAPSULE | Freq: Two times a day (BID) | ORAL | Status: DC
  Filled 2015-09-30 (×3): qty 1

## 2015-09-30 MED ORDER — NICOTINE 21 MG/24HR TD PT24
21.0000 mg | MEDICATED_PATCH | Freq: Every day | TRANSDERMAL | Status: DC
  Administered 2015-09-30 – 2015-10-03 (×2): 21 mg via TRANSDERMAL
  Filled 2015-09-30 (×9): qty 1
  Filled 2015-09-30: qty 14
  Filled 2015-09-30: qty 1

## 2015-09-30 NOTE — Tx Team (Signed)
Initial Interdisciplinary Treatment Plan   PATIENT STRESSORS: Financial difficulties Health problems Marital or family conflict Medication change or noncompliance Occupational concerns Substance abuse   PATIENT STRENGTHS: Average or above average intelligence Capable of independent living Communication skills General fund of knowledge Motivation for treatment/growth   PROBLEM LIST: Problem List/Patient Goals Date to be addressed Date deferred Reason deferred Estimated date of resolution  "panic attacks" 09/30/2015   D/c  "anxiety" 09/30/2015   D/c  "depression" 09/30/2015   D/c  "substance abuse" 09/30/2015   D/c  "suicidal thoughts" 09/30/2015   D/c                           DISCHARGE CRITERIA:  Ability to meet basic life and health needs Adequate post-discharge living arrangements Improved stabilization in mood, thinking, and/or behavior Medical problems require only outpatient monitoring Motivation to continue treatment in a less acute level of care Need for constant or close observation no longer present Reduction of life-threatening or endangering symptoms to within safe limits Safe-care adequate arrangements made Verbal commitment to aftercare and medication compliance Withdrawal symptoms are absent or subacute and managed without 24-hour nursing intervention  PRELIMINARY DISCHARGE PLAN: Attend aftercare/continuing care group Attend PHP/IOP Attend 12-step recovery group Outpatient therapy Placement in alternative living arrangements  PATIENT/FAMIILY INVOLVEMENT: This treatment plan has been presented to and reviewed with the patient, Amy Le.  The patient and family have been given the opportunity to ask questions and make suggestions.  Earline MayotteKnight, Donis Pinder Shephard 09/30/2015, 11:47 AM

## 2015-09-30 NOTE — [facility] Suicide Risk Assessment (Signed)
[facility] INPATIENT:  Family/Significant Other Suicide Prevention Education  Suicide Prevention Education:  Patient Refusal for Family/Significant Other Suicide Prevention Education: The patient Amy RankinJacquetta Huwe has refused to provide written consent for family/significant other to be provided Family/Significant Other Suicide Prevention Education during admission and/or prior to discharge.  Physician notified.  SPE completed with pt, as pt refused to consent to family contact. SPI pamphlet provided to pt and pt was encouraged to share information with support network, ask questions, and talk about any concerns relating to SPE. Pt denies access to guns/firearms and verbalized understanding of information provided. Mobile Crisis information also provided to pt.   Smart, Lindzy Rupert LCSW 09/30/2015, 4:14 PM
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
## 2015-09-30 NOTE — Tx Team (Signed)
Interdisciplinary Treatment Plan Update (Adult)  Date:  09/30/2015  Time Reviewed:  8:36 AM   Progress in Treatment: Attending groups: No. New to unit. Continuing to assess.  Participating in groups:  No. Taking medication as prescribed:  Yes. Tolerating medication:  Yes. Family/Significant othe contact made:   Patient understands diagnosis:  Yes. and As evidenced by:  seeking treatment for panic/anxiety, medication stabilization. Pt reports "occassional cocaine use." pt BAL positive for cocaine, THC, and benzos.  Discussing patient identified problems/goals with staff:  Yes. Medical problems stabilized or resolved:  Yes. Denies suicidal/homicidal ideation: Yes. Issues/concerns per patient self-inventory:  Other:  Discharge Plan or Barriers: CSW assessing for appropriate referrals.   Reason for Continuation of Hospitalization: Anxiety Medication stabilization  Comments:  Amy Le is an 40 y.o. female presenting voluntarily to [facility] for panic attacks and anxiety. Patient states that she was at the store and someone called 911 due to her having a panic attack. Patient states that she cannot remember if she asked them to call or not. Patient states that she has had anxiety attacks before but she cannot recall when was the last time or when they started. Patient denies SI and history of attempts. Patient denies self injurious behaviors. Patient denies HI and history of aggression. Patient denies access to firearms. Patient denies recent legal charges. Patient denies AVH. Patient states that she needs her medication but she cannot recall the name of the medication or who prescribed the medication. Patient states that she uses cocaine "occassionally" and last used $60 worth of cocaine "a couple days ago." Patient denies use of other substances. Patient denies use of alcohol. Patient BAL <5 and UDS +cocaine, + THC, + Benzodiazepines. . Diagnosis: Polysubstance Induced Mood  Disorder  Estimated length of stay:  3-5 days   New goal(s): to develop effective aftercare plan.   Additional Comments:  Patient and CSW reviewed pt's identified goals and treatment plan. Patient verbalized understanding and agreed to treatment plan. CSW reviewed [facility] "Discharge Process and Patient Involvement" Form. Pt verbalized understanding of information provided and signed form.    Review of initial/current patient goals per problem list:  1. Goal(s): Patient will participate in aftercare plan  Met: No.   Target date: at discharge  As evidenced by: Patient will participate within aftercare plan AEB aftercare provider and housing plan at discharge being identified.  7/18: CSW assessing for appropriate referrals.   2. Goal(s): Patient will demonstrate decreased signs and symptoms of anxiety.  Met:No.   Target date: at discharge  As evidenced by: Patient will utilize self rating of anxiety at 3 or below and demonstrated decreased signs of anxiety, or be deemed stable for discharge by MD  7/18: Pt rates anxiety as 10/10 and reports daily panic attacks. Denies SI/HI/AVH or depression.   4. Goal(s): Patient will demonstrate decreased signs of withdrawal due to substance abuse  Met:No.   Target date:at discharge   As evidenced by: Patient will produce a CIWA/COWS score of 0, have stable vitals signs, and no symptoms of withdrawal.  7/18: Pt reports mild withdrawals with COWS of 3/CIWA of 7 and stable vitals. She is not on detox protocol.   Attendees: Patient:   09/30/2015 8:36 AM   Family:   09/30/2015 8:36 AM   Physician:  Dr. Neita Garnet MD 09/30/2015 8:36 AM   Nursing:   Nevada Crane RN 09/30/2015 8:36 AM   Clinical Social Worker: Maxie Better, LCSW 09/30/2015 8:36 AM   Clinical Social Worker: Erasmo Downer  Drinkard LCSW; Peri Maris LCSWA 09/30/2015 8:36 AM   Other:  Gerline Legacy Nurse Case Manager 09/30/2015 8:36 AM   Other:  Agustina Caroli NP  09/30/2015 8:36 AM    Other:   09/30/2015 8:36 AM   Other:  09/30/2015 8:36 AM   Other:  09/30/2015 8:36 AM   Other:  09/30/2015 8:36 AM    09/30/2015 8:36 AM    09/30/2015 8:36 AM    09/30/2015 8:36 AM    09/30/2015 8:36 AM    Scribe for Treatment Team:   Maxie Better, LCSW 09/30/2015 8:36 AM
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
## 2015-09-30 NOTE — Progress Notes (Signed)
Patient attended group and rated her day a 4. Stated she was not feeling well today. Goal is to get adjusted to medications while here.
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
## 2015-09-30 NOTE — Progress Notes (Signed)
Report given to RN.  Pt transferred to 302-01.

## 2015-09-30 NOTE — [facility] Group Notes (Signed)
The focus of this group is to educate the patient on the purpose and policies of crisis stabilization and provide a format to answer questions about their admission.  The group details unit policies and expectations of patients while admitted.  Patient did not attend 0900 nurse education orientation group this morning.  Patient stayed in bed.   

## 2015-09-30 NOTE — Plan of Care (Signed)
Problem: Education: Goal: Utilization of techniques to improve thought processes will improve Outcome: Progressing Nurse discussed depression/coping skills with patient.    

## 2015-09-30 NOTE — Progress Notes (Signed)
Pt awake and complaining about breakfast.  Pt sts I am supposed to wake up all the other pts and tell them their breakfast is ready.  Advised pt that her breakfast was ready and on her table and this writer would take care of the other pt.  Pt rude, demanding, condescending and attempts to pry into other patients and their reason for being on the unit. Pt is to be transferred to Adult unit after shift change to room 302-01

## 2015-09-30 NOTE — Progress Notes (Signed)
Recreation Therapy Notes  Animal-Assisted Activity (AAA) Program Checklist/Progress Notes Patient Eligibility Criteria Checklist & Daily Group note for Rec TxIntervention  Date: 07.18.2017 Time: 2:45pm Location: 400 Hall Dayroom    AAA/T Program Assumption of Risk Form signed by Patient/ or Parent Legal Guardian Yes  Patient is free of allergies or sever asthma Yes  Patient reports no fear of animals Yes  Patient reports no history of cruelty to animals Yes  Patient understands his/her participation is voluntary Yes  Behavioral Response: Did not attend.    Amy Le, Amy Le        Amy Le 09/30/2015 3:11 PM 

## 2015-09-30 NOTE — [facility] Group Notes (Signed)
[facility] LCSW Group Therapy  09/30/2015 1:52 PM  Type of Therapy:  Group Therapy  Participation Level:  Did Not Attend-pt chose to rest in room. Invited.   Summary of Progress/Problems: [facility]HA Speaker came to talk about his personal journey with substance abuse and addiction.   Smart, Virgilio Broadhead LCSW 09/30/2015, 1:52 PM
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
## 2015-09-30 NOTE — Plan of Care (Signed)
Problem: Education: Goal: Ability to make informed decisions regarding treatment will improve Outcome: Progressing Nurse discussed suicidal thoughts/coping skills with patient.        

## 2015-10-01 LAB — URINE CULTURE: Special Requests: NORMAL

## 2015-10-01 MED ORDER — TRAZODONE HCL 100 MG PO TABS
100.0000 mg | ORAL_TABLET | Freq: Every day | ORAL | Status: DC
  Filled 2015-10-01: qty 1

## 2015-10-01 MED ORDER — TRAZODONE HCL 150 MG PO TABS
150.0000 mg | ORAL_TABLET | Freq: Every evening | ORAL | Status: DC | PRN
  Administered 2015-10-01: 150 mg via ORAL

## 2015-10-01 MED ORDER — SERTRALINE HCL 50 MG PO TABS
150.0000 mg | ORAL_TABLET | Freq: Every day | ORAL | Status: DC
  Administered 2015-10-02 – 2015-10-08 (×7): 150 mg via ORAL
  Filled 2015-10-01 (×8): qty 3

## 2015-10-01 NOTE — Plan of Care (Signed)
Problem: Education: Goal: Knowledge of the prescribed therapeutic regimen will improve Outcome: Progressing Patient is taking medications as prescribed, though she is not initiating the interactions.

## 2015-10-01 NOTE — Progress Notes (Signed)
Amy Le rates Anxiety and Depression 8/10. Her goal today was to "stay calm". Denies SI/HI/AVH. Encouragement and support given. Medications administered as prescribed. Continue Q 15 minute checks for patient safety and medication effectiveness.

## 2015-10-01 NOTE — Progress Notes (Signed)
Recreation Therapy Notes  Date: 07.19.2017 Time: 9:30am Location: 300 Hall Group Room   Group Topic: Stress Management  Goal Area(s) Addresses:  Patient will actively participate in stress management techniques presented during session.   Behavioral Response: Engaged, Attentive   Intervention: Stress management techniques  Activity :  Deep Breathing and Guided Imagery. LRT provided education, instruction and demonstration on practice of  Deep Breathing and Guided Imagery. Patient was asked to participate in technique introduced during session.   Education:  Stress Management, Discharge Planning.   Education Outcome: Acknowledges education  Clinical Observations/Feedback: Patient actively engaged in technique introduced, expressed no concerns and demonstrated ability to practice independently post d/c.   Gentri Guardado L Litisha Guagliardo, LRT/CTRS        Deja Pisarski L 10/01/2015 3:25 PM 

## 2015-10-01 NOTE — Progress Notes (Signed)
Data. Patient denies SI/HI/AVH.   Patient spending most of day, isolated to her room. No initiationof interactions with staff. Refused AM nicotine patch. Able to contract for safety on the unit. Action. Emotional support and encouragement offered. Education provided on medication, indications and side effect. Q 15 minute checks done for safety. Response. Safety on the unit maintained through 15 minute checks.  Medications taken as prescribed. Attended groups. Remained calm and appropriate through out shift.

## 2015-10-01 NOTE — Progress Notes (Addendum)
[facility] MD Progress Note  10/01/2015 11:37 AM Amy Le  MRN:  [id number] Subjective:  Patient reports ongoing depression and irritability, but does acknowledge she is feeling better today . Denies WDL symptoms, and does not appear in any acute distress . Her major complaint at this time is insomnia, which she attributes to getting lower dose of Trazodone ( 50 mgrs last night) than what she usually takes ( 250 mgrs at night). Denies medication side effects. Objective : I have discussed case with treatment team , and have met with patient. Patient remains vaguely dysphoric, depressed, and irritable, but significantly better than on admission, and today states " I am glad to be here, I know I need to be here, things start going bad very quickly after I relapse ". States she is interested in a rehab program after discharge. No disruptive or agitated behaviors at this time. More visible on unit , has started going to groups.  Principal Problem: Cocaine abuse with cocaine-induced mood disorder ([place]) Diagnosis:   Patient Active Problem List   Diagnosis Date Noted  . Cocaine abuse with cocaine-induced mood disorder [facility]) [F14.14] 09/29/2015   Total Time spent with patient: 25 minutes     Past Medical History:  Past Medical History  Diagnosis Date  . Hypertension   . BV (bacterial vaginosis)   . Chlamydia   . Trichomonas     Past Surgical History  Procedure Laterality Date  . Tubal ligation    . Foot surgery     Family History: History reviewed. No pertinent family history.  Social History:  History  Alcohol Use No     History  Drug Use  . Yes  . Special: Cocaine, "Crack" cocaine, Marijuana    Comment: been using daily since last week..    Social History   Social History  . Marital Status: Married    Spouse Name: N/A  . Number of Children: N/A  . Years of Education: N/A   Social History Main Topics  . Smoking status: Current Every Day Smoker -- 0.50 packs/day   Types: Cigarettes  . Smokeless tobacco: None  . Alcohol Use: No  . Drug Use: Yes    Special: Cocaine, "Crack" cocaine, Marijuana     Comment: been using daily since last week..  . Sexual Activity: Not Asked   Other Topics Concern  . None   Social History Narrative   Additional Social History:   Sleep: Poor  Appetite:  Fair  Current Medications: Current Facility-Administered Medications  Medication Dose Route Frequency Provider Last Rate Last Dose  . acetaminophen (TYLENOL) tablet 650 mg  650 mg Oral Q6H PRN Patrecia Pour, NP   650 mg at 09/29/15 2200  . alum & mag hydroxide-simeth (MAALOX/MYLANTA) 200-200-20 MG/5ML suspension 30 mL  30 mL Oral Q4H PRN Patrecia Pour, NP      . dicyclomine (BENTYL) tablet 20 mg  20 mg Oral Q6H PRN Laverle Hobby, PA-C      . gabapentin (NEURONTIN) capsule 400 mg  400 mg Oral QID Patrecia Pour, NP   400 mg at 10/01/15 0904  . hydrOXYzine (ATARAX/VISTARIL) tablet 50 mg  50 mg Oral Q6H PRN Kerrie Buffalo, NP   50 mg at 09/30/15 2110  . loperamide (IMODIUM) capsule 2-4 mg  2-4 mg Oral PRN Laverle Hobby, PA-C      . magnesium hydroxide (MILK OF MAGNESIA) suspension 30 mL  30 mL Oral Daily PRN Patrecia Pour, NP      .  methocarbamol (ROBAXIN) tablet 500 mg  500 mg Oral Q8H PRN Laverle Hobby, PA-C   500 mg at 09/30/15 0857  . naproxen (NAPROSYN) tablet 500 mg  500 mg Oral BID PRN Laverle Hobby, PA-C   500 mg at 09/30/15 0856  . nicotine (NICODERM CQ - dosed in mg/24 hours) patch 21 mg  21 mg Transdermal Daily Jenne Campus, MD   21 mg at 09/30/15 1717  . nitrofurantoin (macrocrystal-monohydrate) (MACROBID) capsule 100 mg  100 mg Oral Q12H Kerrie Buffalo, NP   100 mg at 10/01/15 0905  . ondansetron (ZOFRAN-ODT) disintegrating tablet 4 mg  4 mg Oral Q6H PRN Laverle Hobby, PA-C      . QUEtiapine (SEROQUEL) tablet 100 mg  100 mg Oral BID WC Patrecia Pour, NP   100 mg at 10/01/15 0905  . QUEtiapine (SEROQUEL) tablet 400 mg  400 mg Oral QHS  Patrecia Pour, NP   400 mg at 09/30/15 2108  . [START ON 10/02/2015] sertraline (ZOLOFT) tablet 150 mg  150 mg Oral Daily Myer Peer Cobos, MD      . traZODone (DESYREL) tablet 100 mg  100 mg Oral QHS Jenne Campus, MD        Lab Results: No results found for this or any previous visit (from the past 48 hour(s)).  Blood Alcohol level:  Lab Results  Component Value Date   ETH <5 43/56/8616    Metabolic Disorder Labs: No results found for: HGBA1C, MPG No results found for: PROLACTIN No results found for: CHOL, TRIG, HDL, CHOLHDL, VLDL, LDLCALC  Physical Findings: AIMS: Facial and Oral Movements Muscles of Facial Expression: None, normal Lips and Perioral Area: None, normal Jaw: None, normal Tongue: None, normal,Extremity Movements Upper (arms, wrists, hands, fingers): None, normal Lower (legs, knees, ankles, toes): None, normal, Trunk Movements Neck, shoulders, hips: None, normal, Overall Severity Severity of abnormal movements (highest score from questions above): None, normal Incapacitation due to abnormal movements: None, normal Patient's awareness of abnormal movements (rate only patient's report): No Awareness, Dental Status Current problems with teeth and/or dentures?: No Does patient usually wear dentures?: No  CIWA:  CIWA-Ar Total: 2 COWS:  COWS Total Score: 3  Musculoskeletal: Strength & Muscle Tone: within normal limits Gait & Station: normal Patient leans: N/A  Psychiatric Specialty Exam: Physical Exam  ROS no headache, no chest pain, no shortness of breath, no vomiting - at this time denies dysuria or urgency, no fever, no chills   Blood pressure 108/69, pulse 95, temperature 98.6 F (37 C), temperature source Oral, resp. rate 16, height '5\' 6"'  (1.676 m), weight 214 lb (97.07 kg), last menstrual period 09/28/2015, SpO2 100 %.Body mass index is 34.56 kg/(m^2).  General Appearance: Fairly Groomed  Eye Contact:  improved   Speech:  Normal Rate  Volume:   Decreased  Mood:  vaguely depressed, less irritable and less dysphoric today  Affect:  mildly irritable, but improved, does smile briefly at times today  Thought Process:  Linear  Orientation:  Full (Time, Place, and Person)  Thought Content:  has describes fleeting visual halluciantions ( chronic) at this time not internally preoccupied, no  delusions   Suicidal Thoughts:  No today denies any suicidal ideations, denies any self injurious ideations, contracts for safety on the unit   Homicidal Thoughts:  No- denies any homicidal ideations   Memory:  recent and remote grossly intact   Judgement:  Fair  Insight:  Fair  Psychomotor Activity:  Normal  Concentration:  Concentration: Good and Attention Span: Good  Recall:  Good  Fund of Knowledge:  Good  Language:  Good  Akathisia:  Negative  Handed:  Right  AIMS (if indicated):     Assets:  Desire for Improvement Resilience  ADL's:  Intact  Cognition:  WNL  Sleep:  Number of Hours: 4.75  Assessment- patient remains depressed, vaguely irritable, but improved compared to admission . She does present with a somewhat improved range of affect and improved overall relatedness. Tolerating medications well . Complains of ongoing insomnia, states that prior to admission was taking Trazodone at 250 mgrs QHS, denies having had side effects- requesting Trazodone dose increase  Feeling guilty and upset about relapse, expresses motivation in trying to maintain sobriety, may go to a Rehab setting on discharge    Treatment Plan Summary: Daily contact with patient to assess and evaluate symptoms and progress in treatment, Medication management, Plan inpatient treatment  and medications as below  Encourage group and milieu participation to work on coping skills and symptom reduction Continue to encourage patient 's efforts to work on sobriety and relapse prevention  Increase Trazodone to 150 mgrs HS for insomnia  Continue Neurontin 400 mgrs QID for  anxiety, pain Continue Seroquel 100 mgrs QAM and 400 mgrs QHS for mood disorder- side effects have been discussed, reviewed  Continue Zoloft 150 mgrs QDAY for depression, anxiety. Treatment team working on disposition [place], [place], MD 10/01/2015, 11:37 AM
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
## 2015-10-01 NOTE — [facility] Group Notes (Signed)
[facility] LCSW Group Therapy  10/01/2015 3:26 PM  Type of Therapy:  Group Therapy  Participation Level:  Did Not Attend-pt invited. Chose to rest in room.   Summary of Progress/Problems: Today's Topic: Overcoming Obstacles. Patients identified one short term goal and potential obstacles in reaching this goal. Patients processed barriers involved in overcoming these obstacles. Patients identified steps necessary for overcoming these obstacles and explored motivation (internal and external) for facing these difficulties head on.   Smart, Keishawn Rajewski LCSW 10/01/2015, 3:26 PM

## 2015-10-01 NOTE — Progress Notes (Signed)
D: Pt was in bed in her room upon initial approach.  She reports her day was "so-so."  Denies having a goal.  Denies SI/HI, reports visual hallucinations of "seeing all types of stuff, red lines, black lines."  She reports that her visual hallucinations are not new.  Pt denies pain.  Pt has anxious affect and mood.  Pt has been visible in milieu interacting with peers and staff appropriately.  Pt attended evening group.   A: Introduced self to pt.  Met with pt and offered support and encouragement.  Actively listened to pt.  Medications administered per order.  PRN medication administered for anxiety. R: Pt is compliant with medications.  Pt verbally contracts for safety.  Will continue to monitor and assess.

## 2015-10-02 MED ORDER — CIPROFLOXACIN HCL 500 MG PO TABS: 500.0000 mg | ORAL_TABLET | Freq: Two times a day (BID) | ORAL | Status: DC

## 2015-10-02 MED ORDER — TUBERCULIN PPD 5 UNIT/0.1ML ID SOLN
5.0000 [IU] | Freq: Once | INTRADERMAL | Status: AC
  Administered 2015-10-02: 5 [IU] via INTRADERMAL

## 2015-10-02 MED ORDER — TRAZODONE HCL 100 MG PO TABS
200.0000 mg | ORAL_TABLET | Freq: Every evening | ORAL | Status: DC | PRN
  Administered 2015-10-02 – 2015-10-03 (×2): 200 mg via ORAL
  Filled 2015-10-02 (×2): qty 2

## 2015-10-02 MED ORDER — GABAPENTIN 300 MG PO CAPS
600.0000 mg | ORAL_CAPSULE | Freq: Four times a day (QID) | ORAL | Status: DC
  Administered 2015-10-02 – 2015-10-08 (×23): 600 mg via ORAL
  Filled 2015-10-02 (×27): qty 2

## 2015-10-02 NOTE — Progress Notes (Signed)
Patient ID: Amy RankinJacquetta Bastidas, female   DOB: 1975/09/04, 40 y.o.   MRN: [id number]  DAR: Pt. Denies HI and A/V Hallucinations. She reports passive SI but is able to contract for safety. She reports sleep is poor, appetite is good, energy level is low, and concentration is good. She rates depression 8/10, hopelessness 8/10, and anxiety 9/10. Patient does report pain this morning and received PRN Naproxen. She also reports a lot of anxiety surrounding discharge and Vistaril was provided. Support and encouragement provided to the patient. Scheduled medications administered to patient per physician's orders. Patient is minimal but cooperative. PPD placed in patient's right forearm and no distress upon administration. Q15 minute checks are maintained for safety.
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
## 2015-10-02 NOTE — [facility] Group Notes (Signed)
[facility] Group Notes:  (Nursing/MHT/Case Management/Adjunct)  Date:  10/02/2015  Time:  2:06 PM  Type of Therapy:  Psychoeducational Skills  Participation Level:  Minimal  Participation Quality:  Resistant  Affect:  Blunted and Depressed  Cognitive:  Oriented  Insight:  Limited  Engagement in Group:  Resistant  Modes of Intervention:  Discussion and Education  Summary of Progress/Problems: Patient came to group, minimally engaged.  Mehreen Azizi E 10/02/2015, 2:06 PM

## 2015-10-02 NOTE — Progress Notes (Signed)
Pt has been referred to [facility] today by CSW. CSW requested that Reggie assess pt for services at his earliest convenience.  Trula SladeHeather Smart, MSW, LCSW Clinical Social Worker 10/02/2015 11:08 AM

## 2015-10-02 NOTE — Progress Notes (Signed)
Patient ID: Amy Le, female   DOB: 08-Jul-1975, 40 y.o.   MRN: [id number] [facility] MD Progress Note  10/02/2015 3:13 PM [name]  MRN:  [id number]  Subjective: [name] reports, "I'm not sleeping at all at night. My anxiety is so high today. I feel restless & agitated. Can I go back to my Adderall please, it helps me with my anxiety. When I'm feeling this way, my mood gets out of place. I need something to help me calm down"   Objective : I have discussed case with treatment team , and have met with patient. Patient remains vaguely dysphoric, depressed, and irritable. She says she is not sleeping well at night. She is reporting being restless & agitated, but significantly better than on admission. States she is interested in a rehab program after discharge. No disruptive or agitated behaviors at this time. More visible on unit , has started going to groups. Instructed & explained to patient that Adderall is not indicated for anxiety & can actually increase anxiety & restlessness as it used as a stimulant. She is receiving her dose of TB skin test today in preparation for a residential treatment center enrollment. She is on an antibiotic therapy for UTI. Tolerating treatment well.  Principal Problem: Cocaine abuse with cocaine-induced mood disorder ([place]) Diagnosis:   Patient Active Problem List   Diagnosis Date Noted  . Cocaine abuse with cocaine-induced mood disorder [facility]) [F14.14] 09/29/2015   Total Time spent with patient: 15 minutes  Past Medical History:  Past Medical History  Diagnosis Date  . Hypertension   . BV (bacterial vaginosis)   . Chlamydia   . Trichomonas     Past Surgical History  Procedure Laterality Date  . Tubal ligation    . Foot surgery     Family History: History reviewed. No pertinent family history.  Social History:  History  Alcohol Use No     History  Drug Use  . Yes  . Special: Cocaine, "Crack" cocaine, Marijuana    Comment: been using  daily since last week..    Social History   Social History  . Marital Status: Married    Spouse Name: N/A  . Number of Children: N/A  . Years of Education: N/A   Social History Main Topics  . Smoking status: Current Every Day Smoker -- 0.50 packs/day    Types: Cigarettes  . Smokeless tobacco: None  . Alcohol Use: No  . Drug Use: Yes    Special: Cocaine, "Crack" cocaine, Marijuana     Comment: been using daily since last week..  . Sexual Activity: Not Asked   Other Topics Concern  . None   Social History Narrative   Additional Social History:   Sleep: Poor  Appetite:  Fair  Current Medications: Current Facility-Administered Medications  Medication Dose Route Frequency Provider Last Rate Last Dose  . acetaminophen (TYLENOL) tablet 650 mg  650 mg Oral Q6H PRN Patrecia Pour, NP   650 mg at 09/29/15 2200  . alum & mag hydroxide-simeth (MAALOX/MYLANTA) 200-200-20 MG/5ML suspension 30 mL  30 mL Oral Q4H PRN Patrecia Pour, NP      . dicyclomine (BENTYL) tablet 20 mg  20 mg Oral Q6H PRN Laverle Hobby, PA-C      . gabapentin (NEURONTIN) capsule 600 mg  600 mg Oral QID Encarnacion Slates, NP      . hydrOXYzine (ATARAX/VISTARIL) tablet 50 mg  50 mg Oral Q6H PRN Kerrie Buffalo, NP   50 mg  at 10/02/15 0834  . loperamide (IMODIUM) capsule 2-4 mg  2-4 mg Oral PRN Laverle Hobby, PA-C      . magnesium hydroxide (MILK OF MAGNESIA) suspension 30 mL  30 mL Oral Daily PRN Patrecia Pour, NP      . methocarbamol (ROBAXIN) tablet 500 mg  500 mg Oral Q8H PRN Laverle Hobby, PA-C   500 mg at 09/30/15 0857  . naproxen (NAPROSYN) tablet 500 mg  500 mg Oral BID PRN Laverle Hobby, PA-C   500 mg at 10/02/15 0834  . nicotine (NICODERM CQ - dosed in mg/24 hours) patch 21 mg  21 mg Transdermal Daily Jenne Campus, MD   21 mg at 09/30/15 1717  . nitrofurantoin (macrocrystal-monohydrate) (MACROBID) capsule 100 mg  100 mg Oral Q12H Kerrie Buffalo, NP   100 mg at 10/02/15 7680  . ondansetron  (ZOFRAN-ODT) disintegrating tablet 4 mg  4 mg Oral Q6H PRN Laverle Hobby, PA-C      . QUEtiapine (SEROQUEL) tablet 100 mg  100 mg Oral BID WC Patrecia Pour, NP   100 mg at 10/02/15 1136  . QUEtiapine (SEROQUEL) tablet 400 mg  400 mg Oral QHS Patrecia Pour, NP   400 mg at 10/01/15 2107  . sertraline (ZOLOFT) tablet 150 mg  150 mg Oral Daily Jenne Campus, MD   150 mg at 10/02/15 8811  . traZODone (DESYREL) tablet 200 mg  200 mg Oral QHS PRN Encarnacion Slates, NP      . tuberculin injection 5 Units  5 Units Intradermal Once Encarnacion Slates, NP        Lab Results:  Results for orders placed or performed during the hospital encounter of 09/29/15 (from the past 48 hour(s))  Culture, Urine     Status: Abnormal   Collection Time: 09/30/15  4:22 PM  Result Value Ref Range   Specimen Description      URINE, CLEAN CATCH Performed at [facility]      Normal Performed at [facility]    Culture MULTIPLE SPECIES PRESENT, SUGGEST RECOLLECTION (A)    Report Status 10/01/2015 FINAL    Blood Alcohol level:  Lab Results  Component Value Date   ETH <5 05/27/9456   Metabolic Disorder Labs: No results found for: HGBA1C, MPG No results found for: PROLACTIN No results found for: CHOL, TRIG, HDL, CHOLHDL, VLDL, LDLCALC  Physical Findings: AIMS: Facial and Oral Movements Muscles of Facial Expression: None, normal Lips and Perioral Area: None, normal Jaw: None, normal Tongue: None, normal,Extremity Movements Upper (arms, wrists, hands, fingers): None, normal Lower (legs, knees, ankles, toes): None, normal, Trunk Movements Neck, shoulders, hips: None, normal, Overall Severity Severity of abnormal movements (highest score from questions above): None, normal Incapacitation due to abnormal movements: None, normal Patient's awareness of abnormal movements (rate only patient's report): No Awareness, Dental Status Current problems with teeth  and/or dentures?: No Does patient usually wear dentures?: No  CIWA:  CIWA-Ar Total: 0 COWS:  COWS Total Score: 3  Musculoskeletal: Strength & Muscle Tone: within normal limits Gait & Station: normal Patient leans: N/A  Psychiatric Specialty Exam: Physical Exam  ROS no headache, no chest pain, no shortness of breath, no vomiting - at this time denies dysuria or urgency, no fever, no chills   Blood pressure 103/64, pulse 104, temperature 98.4 F (36.9 C), temperature source Oral, resp. rate 18, height '5\' 6"'  (1.676 m), weight 97.07  kg (214 lb), last menstrual period 09/28/2015, SpO2 100 %.Body mass index is 34.56 kg/(m^2).  General Appearance: Fairly Groomed  Eye Contact:  improved   Speech:  Normal Rate  Volume:  Decreased  Mood:  vaguely depressed, less irritable and less dysphoric today  Affect:  mildly irritable, but improved, does smile briefly at times today  Thought Process:  Linear  Orientation:  Full (Time, Place, and Person)  Thought Content:  has describes fleeting visual halluciantions ( chronic) at this time not internally preoccupied, no  delusions   Suicidal Thoughts:  No today denies any suicidal ideations, denies any self injurious ideations, contracts for safety on the unit   Homicidal Thoughts:  No- denies any homicidal ideations   Memory:  recent and remote grossly intact   Judgement:  Fair  Insight:  Fair  Psychomotor Activity:  Normal  Concentration:  Concentration: Good and Attention Span: Good  Recall:  Good  Fund of Knowledge:  Good  Language:  Good  Akathisia:  Negative  Handed:  Right  AIMS (if indicated):     Assets:  Desire for Improvement Resilience  ADL's:  Intact  Cognition:  WNL  Sleep:  Number of Hours: 6   Assessment- patient remains depressed, vaguely irritable, but improved compared to admission . She does present with a somewhat improved range of affect and improved overall relatedness. Tolerating medications well . Complains of ongoing  insomnia, states that prior to admission was taking Trazodone at 250 mgrs QHS, denies having had side effects- requesting Trazodone dose increase  Feeling guilty and upset about relapse, expresses motivation in trying to maintain sobriety, may go to a Rehab setting on discharge    Treatment Plan Summary: Daily contact with patient to assess and evaluate symptoms and progress in treatment, Medication management, Plan inpatient treatment  and medications as below  Encourage group and milieu participation to work on coping skills and symptom reduction Continue to encourage patient 's efforts to work on sobriety and relapse prevention  Increase Trazodone to 200 mgrs HS for insomnia  Increased Neurontin to 600 mgrs QID for anxiety, pain Continue Seroquel 100 mgrs QAM and 400 mgrs QHS for mood disorder- side effects have been discussed, reviewed  Continue Zoloft 150 mgrs QDAY for depression, anxiety. Tuberculin skin test 5 units subq once. Treatment team working on disposition planning   Encarnacion Slates, NP, PMHNP, FNP-BC 10/02/2015, 3:13 PM Agree with NP progress note as above

## 2015-10-03 NOTE — Progress Notes (Signed)
Patient did attend the evening speaker AA meeting.  

## 2015-10-03 NOTE — Progress Notes (Signed)
CSW met with pt this afternoon. Pt had meeting with Bernita Buffy from [employer]) and was set up for TCT services. Pt reports that her plan is to discharge early (7:30AM) on Wednesday and get picked up by [facility]. From here, she plans to go to the [facility] where she will get a voucher for a photo ID. She will then go to the [facility] to get ID made and has screening at [facility] for Wed at 12pm (scheduled by Arbie Cookey). Facesheet ID in her chart (in case she is unable to get ID made in time). Patient states that she needs photo ID for [place]where she hopes to go after completing [facility] 28 day inpatient program. CSW has been working with pt to acquire criminal background--faxed to [place] by [employer] and put in patient chart. She also had social security card mailed to the hospital and stated that her RN, Rise Paganini assisted with this. Patient was given clothing and is now requesting a bra, underwear and flip flops. Pt continues to be demanding at times but has demonstrated better ability to regulate her emotions over the past few days.   Maxie Better, MSW, LCSW Clinical Social Worker 10/03/2015 3:55 PM

## 2015-10-03 NOTE — Progress Notes (Signed)
D    Pt reports she enjoyed karaoke group and said it really cheered her up    She was complaining about her abdomen and asked how she could reduce the size of her abdomen    She is pleasant and interacts well with others    She is compliant with treatment A    Verbal support given   Medications administered and effectiveness monitored    Q 15 min checks    Gave dietary recommendations per her request to loose weight in her abdomen and encouraged mild activity such as walking R    Pt is safe at present time and receptive to instructions

## 2015-10-03 NOTE — Tx Team (Signed)
Interdisciplinary Treatment Plan Update (Adult)  Date:  10/03/2015  Time Reviewed:  10:44 AM   Progress in Treatment: Attending groups:Yes-intermittently  Participating in groups: Yes, when she attends.  Taking medication as prescribed:  Yes. Tolerating medication:  Yes. Family/Significant othe contact made:   Patient understands diagnosis:  Yes. and As evidenced by:  seeking treatment for panic/anxiety, medication stabilization. Pt reports "occassional cocaine use." pt BAL positive for cocaine, THC, and benzos.  Discussing patient identified problems/goals with staff:  Yes. Medical problems stabilized or resolved:  Yes. Denies suicidal/homicidal ideation: Yes. Issues/concerns per patient self-inventory:  Other:  Discharge Plan or Barriers: [facility] Wed at 12pm. She is working to gather necessary items for admission to [facility] in [place], [place]. Pt has been referred to [facility].   Reason for Continuation of Hospitalization: Anxiety Medication stabilization  Comments:  Amy Le is an 40 y.o. female presenting voluntarily to [facility] for panic attacks and anxiety. Patient states that she was at the store and someone called 911 due to her having a panic attack. Patient states that she cannot remember if she asked them to call or not. Patient states that she has had anxiety attacks before but she cannot recall when was the last time or when they started. Patient denies SI and history of attempts. Patient denies self injurious behaviors. Patient denies HI and history of aggression. Patient denies access to firearms. Patient denies recent legal charges. Patient denies AVH. Patient states that she needs her medication but she cannot recall the name of the medication or who prescribed the medication. Patient states that she uses cocaine "occassionally" and last used $60 worth of cocaine "a couple days ago." Patient denies use of other substances. Patient denies use of  alcohol. Patient BAL <5 and UDS +cocaine, + THC, + Benzodiazepines. . Diagnosis: Polysubstance Induced Mood Disorder  Estimated length of stay:  D/c scheduled for Wed morning.   Additional Comments:  Patient and CSW reviewed pt's identified goals and treatment plan. Patient verbalized understanding and agreed to treatment plan. CSW reviewed [facility] "Discharge Process and Patient Involvement" Form. Pt verbalized understanding of information provided and signed form.    Review of initial/current patient goals per problem list:  1. Goal(s): Patient will participate in aftercare plan  Met: Yes   Target date: at discharge  As evidenced by: Patient will participate within aftercare plan AEB aftercare provider and housing plan at discharge being identified.  7/18: CSW assessing for appropriate referrals.   7/21: Pt plans to discharge with TCT to [facility] in order to apply for ID card. She then plans to go directly to [facility] at 12pm Wednesday for screening.   2. Goal(s): Patient will demonstrate decreased signs and symptoms of anxiety.  Met:No.   Target date: at discharge  As evidenced by: Patient will utilize self rating of anxiety at 3 or below and demonstrated decreased signs of anxiety, or be deemed stable for discharge by MD  7/18: Pt rates anxiety as 10/10 and reports daily panic attacks. Denies SI/HI/AVH or depression.   7/21: Pt rates anxiety as 6/10 and presents with pleasant mood/slightly anxious affect. She reports that mood and anxiety are "improving."   3. Goal(s): Patient will demonstrate decreased signs of withdrawal due to substance abuse  UTM:LYYT progressing.   Target date:at discharge   As evidenced by: Patient will produce a CIWA/COWS score of 0, have stable vitals signs, and no symptoms of withdrawal.  7/18: Pt reports mild withdrawals  with COWS of 3/CIWA of 7 and stable vitals. She is not on detox protocol.   7/21: Pt reports no signs of withdrawal  with high BP/standing pulse and no CIWA/COWS score. Goal progressing.   Attendees: Patient:   10/03/2015 10:44 AM   Family:   10/03/2015 10:44 AM   Physician:  Dr. Neita Garnet MD 10/03/2015 10:44 AM   Nursing:   Reita May RN  10/03/2015 10:44 AM   Clinical Social Worker: Maxie Better, LCSW 10/03/2015 10:44 AM   Clinical Social Worker: Peri Maris LCSWA 10/03/2015 10:44 AM   Other:  Gerline Legacy Nurse Case Manager 10/03/2015 10:44 AM   Other:  Agustina Caroli NP ; May Augustin NP 10/03/2015 10:44 AM   Other:   10/03/2015 10:44 AM   Other:  10/03/2015 10:44 AM   Other:  10/03/2015 10:44 AM   Other:  10/03/2015 10:44 AM    10/03/2015 10:44 AM    10/03/2015 10:44 AM    10/03/2015 10:44 AM    10/03/2015 10:44 AM    Scribe for Treatment Team:   Maxie Better, LCSW 10/03/2015 10:44 AM

## 2015-10-03 NOTE — Progress Notes (Signed)
Data. Patient denies SI/HI/VH.  Patient does report continued AH of, "mumbling", though significantly decreased. Patient interacting well with staff and other patients. On her self inventory she reports 9/10 for depression, 6/10 for hopelessness and 7/10 for anxiety. Her goal today is: "Discharge".  Action. Emotional support and encouragement offered. Education provided on medication, indications and side effect. Q 15 minute checks done for safety. Response. Safety on the unit maintained through 15 minute checks.  Medications taken as prescribed. Attended groups. Remained calm and appropriate through out shift.

## 2015-10-03 NOTE — [facility] Group Notes (Signed)
[facility] LCSW Group Therapy  10/03/2015 3:50 PM  Type of Therapy:  Group Therapy  Participation Level:  Did Not Attend-pt invited. Chose to rest in bed.   Summary of Progress/Problems: Feelings around Relapse. Group members discussed the meaning of relapse and shared personal stories of relapse, how it affected them and others, and how they perceived themselves during this time. Group members were encouraged to identify triggers, warning signs and coping skills used when facing the possibility of relapse. Social supports were discussed and explored in detail.  Smart, Jaree Dwight LCSW 10/03/2015, 3:50 PM

## 2015-10-03 NOTE — Progress Notes (Signed)
Patient ID: Amy Le, female   DOB: 10/08/1975, 40 y.o.   MRN: [id number] [facility] MD Progress Note  10/03/2015 11:47 AM Fatimah Sundquist  MRN:  [id number]  Subjective:  Patient reports partial improvement of mood and anxiety symptoms, but still feels significantly anxious . She states she slept better last night.  She reports ongoing auditory hallucinations, but less intense than at admission, describes these as " mumbling voices" which she cannot make out , does not appear internally preoccupied at this time. At this time denies medication side effects.  Today reports concerns about having had recent casual/unprotected sex prior to admission, and requesting to be tested for STDs- does not report symptoms, and symptoms of UTI have improved . She also complains about " dark rings under my eyes" Therapist, sports does not see any periorbital discoloration at this time) and attributes this to medications and dry , indoor air, requests lotion .   Objective : I have discussed case with treatment team , and have met with patient.  Patient presents partially improved compared to initial presentation, in particular presents less irritable, less angry, and better related, with a fuller range of affect . She is visible on the unit, going to some groups, no disruptive or agitated behaviors on unit. She is tolerating medications well , and currently denies medication side effects. She is more future oriented and is interested in going to a Rehab setting after discharge ( [facility]) and upon completion of Rehab, going to a long term sober residential setting [facility]) .   Principal Problem: Cocaine abuse with cocaine-induced mood disorder ([place]) Diagnosis:   Patient Active Problem List   Diagnosis Date Noted  . Cocaine abuse with cocaine-induced mood disorder [facility]) [F14.14] 09/29/2015   Total Time spent with patient:  20 minutes   Past Medical History:  Past Medical History  Diagnosis Date  .  Hypertension   . BV (bacterial vaginosis)   . Chlamydia   . Trichomonas     Past Surgical History  Procedure Laterality Date  . Tubal ligation    . Foot surgery     Family History: History reviewed. No pertinent family history.  Social History:  History  Alcohol Use No     History  Drug Use  . Yes  . Special: Cocaine, "Crack" cocaine, Marijuana    Comment: been using daily since last week..    Social History   Social History  . Marital Status: Married    Spouse Name: N/A  . Number of Children: N/A  . Years of Education: N/A   Social History Main Topics  . Smoking status: Current Every Day Smoker -- 0.50 packs/day    Types: Cigarettes  . Smokeless tobacco: None  . Alcohol Use: No  . Drug Use: Yes    Special: Cocaine, "Crack" cocaine, Marijuana     Comment: been using daily since last week..  . Sexual Activity: Not Asked   Other Topics Concern  . None   Social History Narrative   Additional Social History:   Sleep: improving   Appetite: improving   Current Medications: Current Facility-Administered Medications  Medication Dose Route Frequency Provider Last Rate Last Dose  . acetaminophen (TYLENOL) tablet 650 mg  650 mg Oral Q6H PRN Patrecia Pour, NP   650 mg at 09/29/15 2200  . alum & mag hydroxide-simeth (MAALOX/MYLANTA) 200-200-20 MG/5ML suspension 30 mL  30 mL Oral Q4H PRN Patrecia Pour, NP      . dicyclomine (BENTYL)  tablet 20 mg  20 mg Oral Q6H PRN Laverle Hobby, PA-C      . gabapentin (NEURONTIN) capsule 600 mg  600 mg Oral QID Encarnacion Slates, NP   600 mg at 10/03/15 1133  . hydrOXYzine (ATARAX/VISTARIL) tablet 50 mg  50 mg Oral Q6H PRN Kerrie Buffalo, NP   50 mg at 10/02/15 2208  . loperamide (IMODIUM) capsule 2-4 mg  2-4 mg Oral PRN Laverle Hobby, PA-C      . magnesium hydroxide (MILK OF MAGNESIA) suspension 30 mL  30 mL Oral Daily PRN Patrecia Pour, NP      . methocarbamol (ROBAXIN) tablet 500 mg  500 mg Oral Q8H PRN Laverle Hobby, PA-C    500 mg at 10/02/15 2208  . naproxen (NAPROSYN) tablet 500 mg  500 mg Oral BID PRN Laverle Hobby, PA-C   500 mg at 10/02/15 2208  . nicotine (NICODERM CQ - dosed in mg/24 hours) patch 21 mg  21 mg Transdermal Daily Jenne Campus, MD   21 mg at 10/03/15 0849  . nitrofurantoin (macrocrystal-monohydrate) (MACROBID) capsule 100 mg  100 mg Oral Q12H Kerrie Buffalo, NP   100 mg at 10/03/15 0850  . ondansetron (ZOFRAN-ODT) disintegrating tablet 4 mg  4 mg Oral Q6H PRN Laverle Hobby, PA-C      . QUEtiapine (SEROQUEL) tablet 100 mg  100 mg Oral BID WC Patrecia Pour, NP   100 mg at 10/03/15 1133  . QUEtiapine (SEROQUEL) tablet 400 mg  400 mg Oral QHS Patrecia Pour, NP   400 mg at 10/02/15 2208  . sertraline (ZOLOFT) tablet 150 mg  150 mg Oral Daily Jenne Campus, MD   150 mg at 10/03/15 0850  . traZODone (DESYREL) tablet 200 mg  200 mg Oral QHS PRN Encarnacion Slates, NP   200 mg at 10/02/15 2208  . tuberculin injection 5 Units  5 Units Intradermal Once Encarnacion Slates, NP   5 Units at 10/02/15 1809    Lab Results:  No results found for this or any previous visit (from the past 48 hour(s)). Blood Alcohol level:  Lab Results  Component Value Date   ETH <5 37/34/2876   Metabolic Disorder Labs: No results found for: HGBA1C, MPG No results found for: PROLACTIN No results found for: CHOL, TRIG, HDL, CHOLHDL, VLDL, LDLCALC  Physical Findings: AIMS: Facial and Oral Movements Muscles of Facial Expression: None, normal Lips and Perioral Area: None, normal Jaw: None, normal Tongue: None, normal,Extremity Movements Upper (arms, wrists, hands, fingers): None, normal Lower (legs, knees, ankles, toes): None, normal, Trunk Movements Neck, shoulders, hips: None, normal, Overall Severity Severity of abnormal movements (highest score from questions above): None, normal Incapacitation due to abnormal movements: None, normal Patient's awareness of abnormal movements (rate only patient's report): No  Awareness, Dental Status Current problems with teeth and/or dentures?: No Does patient usually wear dentures?: No  CIWA:  CIWA-Ar Total: 0 COWS:  COWS Total Score: 3  Musculoskeletal: Strength & Muscle Tone: within normal limits Gait & Station: normal Patient leans: N/A  Psychiatric Specialty Exam: Physical Exam  ROS no headache, no chest pain, no shortness of breath, no vomiting - at this time denies dysuria or urgency, no fever, no chills - describes dry skin, mainly periorbital area.   Blood pressure 141/60, pulse 116, temperature 97.7 F (36.5 C), temperature source Oral, resp. rate 18, height '5\' 6"'  (1.676 m), weight 214 lb (97.07 kg), last menstrual period 09/28/2015,  SpO2 100 %.Body mass index is 34.56 kg/(m^2).  General Appearance: improved grooming   Eye Contact:  improved   Speech:  Normal Rate  Volume:  Normal  Mood:  Improving, less dysphoric   Affect:   More reactive, not irritable at this time   Thought Process:  Linear  Orientation:  Full (Time, Place, and Person)  Thought Content:   Ongoing  Auditory hallucinations which she describes as unintelligible, distant mumblings . Does not appear internally preoccupied and no delusions are expressed   Suicidal Thoughts:  No today denies any suicidal ideations, denies any self injurious ideations, contracts for safety on the unit   Homicidal Thoughts:  No- denies any homicidal ideations   Memory:  recent and remote grossly intact   Judgement:  Improving   Insight:  Improving   Psychomotor Activity:  Normal  Concentration:  Concentration: Good and Attention Span: Good  Recall:  Good  Fund of Knowledge:  Good  Language:  Good  Akathisia:  Negative  Handed:  Right  AIMS (if indicated):     Assets:  Desire for Improvement Resilience  ADL's:  Intact  Cognition:  WNL  Sleep:  Number of Hours: 5.25   Assessment- patient  Is presenting with some improvement - presents less dysphoric, less irritable and generally better  related. Describes improving mood, and at this time denies any suicidal ideations.  Reports ongoing, but partially improved, psychotic symptoms ( auditory hallucinations ) Gradually becoming more focused on disposition planning options and at this time reporting high level of interest in going to a rehab and from there to a long term residential setting .Tolerating current medications well .   Treatment Plan Summary: Daily contact with patient to assess and evaluate symptoms and progress in treatment, Medication management, Plan inpatient treatment  and medications as below  Encourage group and milieu participation to work on coping skills and symptom reduction Continue to encourage patient 's efforts to work on sobriety and relapse prevention  Continue  Trazodone  200 mgrs HS for insomnia  Continue Neurontin 600 mgrs QID for anxiety, pain Continue Seroquel 100 mgrs QAM and 400 mgrs QHS for mood disorder- side effects have been discussed, reviewed  Continue Zoloft 150 mgrs QDAY for depression, anxiety. Treatment team working on disposition planning, at this time interested in going to [facility] on discharge. Patient requesting to get STD work up, see above .    Neita Garnet, MD,  10/03/2015, 11:47 AM

## 2015-10-03 NOTE — Progress Notes (Signed)
D  Pt is pleasant on approach and cooperative   She interacts well with others and is compliant with treatment   She continues to endorse some anxiety and depression but appears improved since yesterday    A    Verbal support given   Medications administered and effectiveness monitored    Q 15 min checks R   Pt safe at present

## 2015-10-04 LAB — RPR: RPR Ser Ql: NONREACTIVE

## 2015-10-04 LAB — HIV ANTIBODY (ROUTINE TESTING W REFLEX): HIV SCREEN 4TH GENERATION: NONREACTIVE

## 2015-10-04 MED ORDER — HYDROCERIN EX CREA
TOPICAL_CREAM | Freq: Two times a day (BID) | CUTANEOUS | Status: DC
  Administered 2015-10-04 – 2015-10-08 (×7): via TOPICAL
  Filled 2015-10-04: qty 113

## 2015-10-04 MED ORDER — MIRTAZAPINE 7.5 MG PO TABS
7.5000 mg | ORAL_TABLET | Freq: Every day | ORAL | Status: DC
  Administered 2015-10-04: 7.5 mg via ORAL
  Filled 2015-10-04 (×3): qty 1

## 2015-10-04 NOTE — Plan of Care (Signed)
Problem: Coping: Goal: Ability to cope will improve Outcome: Progressing Patient reports improvement since admission

## 2015-10-04 NOTE — Progress Notes (Signed)
D: Patient is pleasant and cooperative with care. Noted conversing with peers. Interacts appropriately with staff and peers. Med and group compliant.  A: Encouragement and support offered. Medications administered and monitored.  R: Pt remains safe on unit with q 15 min checks.

## 2015-10-04 NOTE — Progress Notes (Signed)
[facility] MD Progress Note  10/04/2015 1:41 PM Amy Le  MRN:  [id number]  Subjective:  Patient reports " I am unable to sleep with the Trazodone I think I need Ambien." Patient reports " I was told I can have lotion for my dry skin"   Objective:Amy Le is awake, alert and oriented X4 , found attending group session.  Denies suicidal or homicidal ideation. Reports auditory muffled voices. denies visual hallucination and does not appear to be responding to internal stimuli. Patient reports interacting well with staff and others. Patient reports she is medication compliant without mediation side effects. States her depression 3/10. Patient feeling better and is hopeful to get into residential treatment.patient reports good appetite other wise and resting well. Support, encouragement and reassurance was provided.    Principal Problem: Cocaine abuse with cocaine-induced mood disorder (HCC) Diagnosis:   Patient Active Problem List   Diagnosis Date Noted  . Cocaine abuse with cocaine-induced mood disorder [facility]) [F14.14] 09/29/2015   Total Time spent with patient:  20 minutes   Past Medical History:  Past Medical History  Diagnosis Date  . Hypertension   . BV (bacterial vaginosis)   . Chlamydia   . Trichomonas     Past Surgical History  Procedure Laterality Date  . Tubal ligation    . Foot surgery     Family History: History reviewed. No pertinent family history.  Social History:  History  Alcohol Use No     History  Drug Use  . Yes  . Special: Cocaine, "Crack" cocaine, Marijuana    Comment: been using daily since last week..    Social History   Social History  . Marital Status: Married    Spouse Name: N/A  . Number of Children: N/A  . Years of Education: N/A   Social History Main Topics  . Smoking status: Current Every Day Smoker -- 0.50 packs/day    Types: Cigarettes  . Smokeless tobacco: None  . Alcohol Use: No  . Drug Use: Yes    Special: Cocaine,  "Crack" cocaine, Marijuana     Comment: been using daily since last week..  . Sexual Activity: Not Asked   Other Topics Concern  . None   Social History Narrative   Additional Social History:   Sleep: improving   Appetite: improving   Current Medications: Current Facility-Administered Medications  Medication Dose Route Frequency Provider Last Rate Last Dose  . acetaminophen (TYLENOL) tablet 650 mg  650 mg Oral Q6H PRN Charm Rings, NP   650 mg at 09/29/15 2200  . alum & mag hydroxide-simeth (MAALOX/MYLANTA) 200-200-20 MG/5ML suspension 30 mL  30 mL Oral Q4H PRN Charm Rings, NP      . dicyclomine (BENTYL) tablet 20 mg  20 mg Oral Q6H PRN Kerry Hough, PA-C      . gabapentin (NEURONTIN) capsule 600 mg  600 mg Oral QID Sanjuana Kava, NP   600 mg at 10/04/15 1208  . hydrOXYzine (ATARAX/VISTARIL) tablet 50 mg  50 mg Oral Q6H PRN Adonis Brook, NP   50 mg at 10/03/15 2141  . loperamide (IMODIUM) capsule 2-4 mg  2-4 mg Oral PRN Kerry Hough, PA-C      . magnesium hydroxide (MILK OF MAGNESIA) suspension 30 mL  30 mL Oral Daily PRN Charm Rings, NP      . methocarbamol (ROBAXIN) tablet 500 mg  500 mg Oral Q8H PRN Kerry Hough, PA-C   500 mg at 10/03/15 2142  .  naproxen (NAPROSYN) tablet 500 mg  500 mg Oral BID PRN Kerry Hough, PA-C   500 mg at 10/03/15 2142  . nicotine (NICODERM CQ - dosed in mg/24 hours) patch 21 mg  21 mg Transdermal Daily Craige Cotta, MD   21 mg at 10/03/15 0849  . nitrofurantoin (macrocrystal-monohydrate) (MACROBID) capsule 100 mg  100 mg Oral Q12H Adonis Brook, NP   100 mg at 10/04/15 0835  . ondansetron (ZOFRAN-ODT) disintegrating tablet 4 mg  4 mg Oral Q6H PRN Kerry Hough, PA-C      . QUEtiapine (SEROQUEL) tablet 100 mg  100 mg Oral BID WC Charm Rings, NP   100 mg at 10/04/15 1207  . QUEtiapine (SEROQUEL) tablet 400 mg  400 mg Oral QHS Charm Rings, NP   400 mg at 10/03/15 2141  . sertraline (ZOLOFT) tablet 150 mg  150 mg Oral Daily  Craige Cotta, MD   150 mg at 10/04/15 0835  . traZODone (DESYREL) tablet 200 mg  200 mg Oral QHS PRN Sanjuana Kava, NP   200 mg at 10/03/15 2142  . tuberculin injection 5 Units  5 Units Intradermal Once Sanjuana Kava, NP   5 Units at 10/02/15 1809    Lab Results:  No results found for this or any previous visit (from the past 48 hour(s)). Blood Alcohol level:  Lab Results  Component Value Date   ETH <5 09/28/2015   Metabolic Disorder Labs: No results found for: HGBA1C, MPG No results found for: PROLACTIN No results found for: CHOL, TRIG, HDL, CHOLHDL, VLDL, LDLCALC  Physical Findings: AIMS: Facial and Oral Movements Muscles of Facial Expression: None, normal Lips and Perioral Area: None, normal Jaw: None, normal Tongue: None, normal,Extremity Movements Upper (arms, wrists, hands, fingers): None, normal Lower (legs, knees, ankles, toes): None, normal, Trunk Movements Neck, shoulders, hips: None, normal, Overall Severity Severity of abnormal movements (highest score from questions above): None, normal Incapacitation due to abnormal movements: None, normal Patient's awareness of abnormal movements (rate only patient's report): No Awareness, Dental Status Current problems with teeth and/or dentures?: No Does patient usually wear dentures?: No  CIWA:  CIWA-Ar Total: 0 COWS:  COWS Total Score: 2  Musculoskeletal: Strength & Muscle Tone: within normal limits Gait & Station: normal Patient leans: N/A  Psychiatric Specialty Exam: Physical Exam  ROS no headache, no chest pain, no shortness of breath, no vomiting - at this time denies dysuria or urgency, no fever, no chills - describes dry skin, mainly periorbital area.   Blood pressure 105/67, pulse 88, temperature 97.8 F (36.6 C), temperature source Oral, resp. rate 16, height  (1.676 m), weight 97.07 kg (214 lb), last menstrual period 09/28/2015, SpO2 100 %.Body mass index is 34.56 kg/(m^2).  General Appearance: Causal    Eye Contact:  improved   Speech:  Normal Rate  Volume:  Normal  Mood:  Improving, less dysphoric   Affect:   Congruent   Thought Process:  Linear  Orientation:  Full (Time, Place, and Person)  Thought Content:   Ongoing  Auditory hallucinations which she describes as unintelligible, distant mumbling's . Does not appear internally preoccupied and no delusions are expressed   Suicidal Thoughts:  No today denies any suicidal ideations, denies any self injurious ideations, contracts for safety on the unit   Homicidal Thoughts:  No- denies any homicidal ideations   Memory:  recent and remote grossly intact   Judgement:  Improving   Insight:  Improving  Psychomotor Activity:  Normal  Concentration:  Concentration: Good and Attention Span: Good  Recall:  Good  Fund of Knowledge:  Good  Language:  Good  Akathisia:  Negative  Handed:  Right  AIMS (if indicated):     Assets:  Desire for Improvement Resilience  ADL's:  Intact  Cognition:  WNL  Sleep:  Number of Hours: 5.25    I agree with current treatment plan on 10/04/2015, Patient seen face-to-face for psychiatric evaluation follow-up, chart reviewed. Reviewed the information documented and agree with the treatment plan.  Treatment Plan Summary: Daily contact with patient to assess and evaluate symptoms and progress in treatment, Medication management, Plan inpatient treatment  and medications as below  Encourage group and milieu participation to work on coping skills and symptom reduction Continue to encourage patient 's efforts to work on sobriety and relapse prevention  Start Remeron 7.5mg  PO QHS for insomnia Start Eucerin Cream for dry skin Discontinue Trazodone  200 mgrs HS for insomnia  Continue Neurontin 600 mgrs QID for anxiety, pain Continue Seroquel 100 mgrs QAM and 400 mgrs QHS for mood disorder- side effects have been discussed, reviewed  Continue Zoloft 150 mgrs QDAY for depression, anxiety. Treatment team working on  disposition planning, at this time interested in going to [facility] on discharge. Patient requesting to get STD work up, see above .    Oneta Rack, NP,  10/04/2015, 1:41 PM  Reviewed the information documented and agree with the treatment plan.  [facility] 10/04/2015 3:40 PM

## 2015-10-04 NOTE — Progress Notes (Signed)
D  Pt is pleasant on approach and cooperative   She interacts well with others and is compliant with treatment   She continues to endorse some anxiety and depression but appears improved since yesterday    A    Verbal support given   Medications administered and effectiveness monitored    Q 15 min checks R   Pt safe at present

## 2015-10-04 NOTE — [facility] Group Notes (Signed)
[facility] Group Notes:  (Clinical Social Work)   01/11/2015     10:00-11:00AM  Summary of Progress/Problems:   In today's process group, patients discussed their answers to the question "What is something you are doing that keeps you from living the life you want?"   A decisional balance exercise on the whiteboard was used to explore the perceived benefits and costs of the various actions listed, as well as potential benefits and costs of deciding to change.  The patient expressed that the unhealthy coping she often uses includes isolation and manipulation.  She states that this negatively impacts her life, because she stops working, will have no contact with her church, sponsor, friends, or family.  She left the room prior to the remainder of the discussion, did not return.  Type of Therapy:  Group Therapy - Process   Participation Level:  Active  Participation Quality:  Attentive  Affect:  Blunted  Cognitive:  Appropriate  Insight:  Improving  Engagement in Therapy:  Improving  Modes of Intervention:  Education, Motivational Interviewing  Ambrose Mantle, LCSW 10/04/2015, 2:23 PM

## 2015-10-05 MED ORDER — MIRTAZAPINE 15 MG PO TABS
15.0000 mg | ORAL_TABLET | Freq: Every day | ORAL | Status: DC
  Administered 2015-10-05 – 2015-10-07 (×4): 15 mg via ORAL
  Filled 2015-10-05 (×4): qty 1

## 2015-10-05 MED ORDER — HYDROXYZINE HCL 25 MG PO TABS
25.0000 mg | ORAL_TABLET | Freq: Four times a day (QID) | ORAL | Status: DC | PRN
  Administered 2015-10-05 – 2015-10-06 (×2): 25 mg via ORAL
  Filled 2015-10-05 (×3): qty 1

## 2015-10-05 NOTE — [facility] Group Notes (Signed)
[facility] Group Notes:  (Clinical Social Work)  10/05/2015  10:00-11:00AM  Summary of Progress/Problems:   The main focus of today's process group was to   1)  identify the patient's current unhealthy supports and plan how to handle them  2)  Identify the patient's current healthy supports and plan what to add.   An emphasis was placed on using counselor, doctor, therapy groups, 12-step groups, and problem-specific support groups to expand supports.    The patient expressed full comprehension of the concepts presented, and agreed that there is a need to add more supports.  The patient stated her family is healthy support for her, while the "dope boys" are unhealthy.  She did not stay in group.  Type of Therapy:  Process Group with Motivational Interviewing  Participation Level:  Minimal  Participation Quality:  Inattentive and Resistant  Affect:  Flat  Cognitive:  Appropriate  Insight:  Limited  Engagement in Therapy:  Engaged  Modes of Intervention:   Education, Support and Processing, Activity  Ambrose Mantle, LCSW 10/05/2015

## 2015-10-05 NOTE — Progress Notes (Signed)
[facility] MD Progress Note  10/05/2015 2:56 PM Amy Le  MRN:  [id number]   Subjective: Patient reports " I am feeling better today and states I have attended group session.''  Objective: Amy Le is awake, alert and oriented X4 , found attending group session. Denies suicidal or homicidal ideation. Reports auditory hallucination that are quite mumbling throughout the day. Denies visual hallucination and does not appear to be responding to internal stimuli. Patient reports interacting well with staff and others. Patient reports she is medication compliant without mediation side effects. Report learning new coping skills. States her depression 2/10. Reports good appetite and states she is resting well. Support, encouragement and reassurance was provided.    Principal Problem: Cocaine abuse with cocaine-induced mood disorder (HCC) Diagnosis:   Patient Active Problem List   Diagnosis Date Noted  . Cocaine abuse with cocaine-induced mood disorder [facility]) [F14.14] 09/29/2015   Total Time spent with patient: 30 minutes  Past Psychiatric History: See Above  Past Medical History:  Past Medical History:  Diagnosis Date  . BV (bacterial vaginosis)   . Chlamydia   . Hypertension   . Trichomonas     Past Surgical History:  Procedure Laterality Date  . FOOT SURGERY    . TUBAL LIGATION     Family History: History reviewed. No pertinent family history. Family Psychiatric  History: See above Social History:  History  Alcohol Use No     History  Drug Use  . Types: Cocaine, "Crack" cocaine, Marijuana    Comment: been using daily since last week..    Social History   Social History  . Marital status: Married    Spouse name: N/A  . Number of children: N/A  . Years of education: N/A   Social History Main Topics  . Smoking status: Current Every Day Smoker    Packs/day: 0.50    Types: Cigarettes  . Smokeless tobacco: None  . Alcohol use No  . Drug use:     Types: Cocaine,  "Crack" cocaine, Marijuana     Comment: been using daily since last week..  . Sexual activity: Not Asked   Other Topics Concern  . None   Social History Narrative  . None   Additional Social History:                         Sleep: Poor  Appetite:  Fair  Current Medications: Current Facility-Administered Medications  Medication Dose Route Frequency Provider Last Rate Last Dose  . acetaminophen (TYLENOL) tablet 650 mg  650 mg Oral Q6H PRN Charm Rings, NP   650 mg at 09/29/15 2200  . alum & mag hydroxide-simeth (MAALOX/MYLANTA) 200-200-20 MG/5ML suspension 30 mL  30 mL Oral Q4H PRN Charm Rings, NP      . gabapentin (NEURONTIN) capsule 600 mg  600 mg Oral QID Sanjuana Kava, NP   600 mg at 10/05/15 1153  . hydrocerin (EUCERIN) cream   Topical BID Oneta Rack, NP      . hydrOXYzine (ATARAX/VISTARIL) tablet 25 mg  25 mg Oral Q6H PRN Oneta Rack, NP      . magnesium hydroxide (MILK OF MAGNESIA) suspension 30 mL  30 mL Oral Daily PRN Charm Rings, NP      . mirtazapine (REMERON) tablet 15 mg  15 mg Oral QHS Oneta Rack, NP      . nicotine (NICODERM CQ - dosed in mg/24 hours) patch 21 mg  21 mg Transdermal Daily Craige Cotta, MD   21 mg at 10/03/15 0849  . nitrofurantoin (macrocrystal-monohydrate) (MACROBID) capsule 100 mg  100 mg Oral Q12H Adonis Brook, NP   100 mg at 10/05/15 0909  . QUEtiapine (SEROQUEL) tablet 100 mg  100 mg Oral BID WC Charm Rings, NP   100 mg at 10/05/15 1154  . QUEtiapine (SEROQUEL) tablet 400 mg  400 mg Oral QHS Charm Rings, NP   400 mg at 10/04/15 2133  . sertraline (ZOLOFT) tablet 150 mg  150 mg Oral Daily Craige Cotta, MD   150 mg at 10/05/15 1610    Lab Results:  Results for orders placed or performed during the hospital encounter of 09/29/15 (from the past 48 hour(s))  HIV antibody     Status: None   Collection Time: 10/04/15  7:39 AM  Result Value Ref Range   HIV Screen 4th Generation wRfx Non Reactive Non Reactive     Comment: (NOTE) Performed At: [facility] [address] [place], [place] [id number] [name] MD JX:[id number] Performed at [facility]   RPR     Status: None   Collection Time: 10/04/15  7:39 AM  Result Value Ref Range   RPR Ser Ql Non Reactive Non Reactive    Comment: (NOTE) Performed At: [facility] [address] [place], [place] [id number] [name] MD ON:[id number] Performed at [facility]     Blood Alcohol level:  Lab Results  Component Value Date   [facility] <5 09/28/2015    Metabolic Disorder Labs: No results found for: HGBA1C, MPG No results found for: PROLACTIN No results found for: CHOL, TRIG, HDL, CHOLHDL, VLDL, LDLCALC  Physical Findings: AIMS: Facial and Oral Movements Muscles of Facial Expression: None, normal Lips and Perioral Area: None, normal Jaw: None, normal Tongue: None, normal,Extremity Movements Upper (arms, wrists, hands, fingers): None, normal Lower (legs, knees, ankles, toes): None, normal, Trunk Movements Neck, shoulders, hips: None, normal, Overall Severity Severity of abnormal movements (highest score from questions above): None, normal Incapacitation due to abnormal movements: None, normal Patient's awareness of abnormal movements (rate only patient's report): No Awareness, Dental Status Current problems with teeth and/or dentures?: No Does patient usually wear dentures?: No  CIWA:  CIWA-Ar Total: 1 COWS:  COWS Total Score: 2  Musculoskeletal: Strength & Muscle Tone: within normal limits Gait & Station: normal Patient leans: N/A  Psychiatric Specialty Exam: Physical Exam  Nursing note and vitals reviewed. Constitutional: She is oriented to person, place, and time. She appears well-developed.  HENT:  Head: Normocephalic.  Cardiovascular: Normal rate.   Musculoskeletal: Normal range of motion.  Neurological: She is alert and oriented to person, place, and time.   Psychiatric: She has a normal mood and affect. Her behavior is normal.    Review of Systems  Psychiatric/Behavioral: Positive for depression and substance abuse. Negative for suicidal ideas. The patient is nervous/anxious.     Blood pressure (!) 110/56, pulse 85, temperature 98.5 F (36.9 C), temperature source Oral, resp. rate 16, height 5\' 6"  (1.676 m), weight 97.1 kg (214 lb), last menstrual period 09/28/2015, SpO2 100 %.Body mass index is 34.54 kg/m.  General Appearance: Guarded  Eye Contact:  Minimal  Speech:  Clear and Coherent  Volume:  Normal  Mood:  Anxious and Depressed  Affect:  Congruent  Thought Process:  Coherent  Orientation:  Full (Time, Place, and Person)  Thought Content:  Hallucinations: None  Suicidal Thoughts:  No  Homicidal Thoughts:  No  Memory:  Immediate;   Fair Recent;   Fair Remote;   Fair  Judgement:  Intact  Insight:  Fair  Psychomotor Activity:  Restlessness- improving patient is more visible on the unit.  Concentration:  Concentration: Good  Recall:  [employer] of Knowledge:  Fair  Language:  Fair  Akathisia:  No  Handed:  Right  AIMS (if indicated):     Assets:  Desire for Improvement Resilience Social Support  ADL's:  Intact  Cognition:  WNL  Sleep:  Number of Hours: 3.75    I agree with current treatment plan on 10/05/2015, Patient seen face-to-face for psychiatric evaluation follow-up, chart reviewed. Reviewed the information documented and agree with the treatment plan.  Treatment Plan Summary: Daily contact with patient to assess and evaluate symptoms and progress in treatment and Medication management   Admit for crisis management and mood stabilization. Medication management to re-stabilize current mood symptoms.    Seroquel 100 mgrs QAM and 400 mgrs QHS for depression , mood disorder, psychosis,  Continue Remeron 15 mg PO QHS for insomnia  Zoloft 100 mgrs QDAY for depression,  Neurontin 300 mgrs QID for moodstablilzation   Group counseling sessions for coping skills Medical consults as needed Review and reinstate any pertinent home medications for other health problems  Oneta Rack, NP 10/05/2015, 2:56 PM   Reviewed the information documented and agree with the treatment plan.  Dhanvin Szeto 10/06/2015 11:21 AM

## 2015-10-05 NOTE — Progress Notes (Signed)
Patient did not attend the evening speaker AA meeting. Pt was notified that group was beginning and left before the meeting got started reporting have attended many groups but was not feeling like attending tonight.

## 2015-10-05 NOTE — Progress Notes (Addendum)
D:  Patient's self inventory sheet, patient has poor sleep, sleep medication is not helpful.  Good appetite, normal energy level, poor concentration.  Rated depression 7, hopeless 8, anxiety 9.  Withdrawals, cravings, agitation, irritability.  Denied SI.  Physical problems, pain, worst pain in past 24 hours #6.  Pain medication is helpful.  Goal is to stay calm.  Plans to pray.  Does have discharge plans. A:  Medications administered per MD orders.  Emotional support and encouragement given patient. R:  Denied SI and HI, contracts for safety.  Denied A/V hallucinations.  Safety maintained with 15 minute checks. Patient will talk with MD today about her "fall[place] [employer]".

## 2015-10-05 NOTE — Progress Notes (Signed)
Patient did attend the evening speaker AA meeting.  

## 2015-10-05 NOTE — Progress Notes (Signed)
UA obtained and placed in lab for pick up tonight.

## 2015-10-05 NOTE — [facility] Group Notes (Signed)

## 2015-10-05 NOTE — Plan of Care (Signed)
Problem: Education: Goal: Ability to state activities that reduce stress will improve Outcome: Progressing Nurse discussed anxiety/coping skills with patient.    

## 2015-10-06 NOTE — Plan of Care (Signed)
Problem: Coping: Goal: Ability to verbalize positive feelings about self will improve Outcome: Progressing Nurse discussed anxiety/depression/coping skills with patient.

## 2015-10-06 NOTE — Progress Notes (Signed)
PATIENT'S PPD IS NEGATIVE.

## 2015-10-06 NOTE — [facility] Group Notes (Signed)
Pt attended spiritual care group on grief and loss facilitated by chaplain Burnis Kingfisher   Group opened with brief discussion and psycho-social ed around grief and loss.  Group identifying life patterns, circumstances, changes connected to loss in relationship and in relation to self. Established group norm of speaking from own life experience. Group goal of establishing open and affirming space for members to share loss and experience with grief, normalize grief experience and provide psycho social education and grief support. Group facilitation draws on narrative, Adlerian, and brief CBT  Tanza was present at group introduction.  She had briefly tearful affect.    As group members began engaging topic of grief, Zahirah left room, stating, "I don't think this is a topic for me."  Belva Crome MDiv

## 2015-10-06 NOTE — Progress Notes (Signed)
D:  Patient's self inventory sheet, patient has poor sleep, sleep medication is not helpful.  Good appetite, normal energy level, poor concentration.  Rated depression, hopeless and anxiety 7.  Denied withdrawals.  Denied SI.  Physical problems, pain, worst pain in past 24 hours is #8.  Pain medication is not helpful.  Goal is getting ready for discharge.  Plans to talk to SW.  Does have discharge plans. A:  Medications administered per MD orders.  Emotional support and encouragement given patient. R:  Denied SI and HI, contracts for safety.  Denied A/V hallucinations.  Safety maintained with 15 minute checks.

## 2015-10-06 NOTE — [facility] Group Notes (Signed)
[facility] LCSW Group Therapy  10/06/2015 4:13 PM  Type of Therapy:  Group Therapy  Participation Level:  Minimal  Participation Quality:  Attentive  Affect:  Irritable  Cognitive:  Oriented  Insight:  Improving  Engagement in Therapy:  Improving  Modes of Intervention:  Discussion, Education, Exploration, Problem-solving, Rapport Building, Socialization and Support  Summary of Progress/Problems: Today's Topic: Overcoming Obstacles. Patients identified one short term goal and potential obstacles in reaching this goal. Patients processed barriers involved in overcoming these obstacles. Patients identified steps necessary for overcoming these obstacles and explored motivation (internal and external) for facing these difficulties head on. Pt was attentive but tended to interrupt others in the group setting about her discharge plan for Wednesday. Patient continues to be demanding regarding her social security card, ID, need for transport, and asking for additional clothing. Patient states that she is nervous about "all I have to do when I leave here." She continues to make limited progress in the group setting with improving insight.   Smart, [name] LCSW 10/06/2015, 4:13 PM

## 2015-10-06 NOTE — Progress Notes (Signed)
Recreation Therapy Notes  Date: 10/06/15 Time: 0930 Location: [address] Group Room  Group Topic: Stress Management  Goal Area(s) Addresses:  Patient will verbalize importance of using healthy stress management.  Patient will identify positive emotions associated with healthy stress management.   Intervention: Stress Management  Activity :  Guided Imagery.  LRT introduced and educated patients on stress management technique of guided imagery.  A script was used to deliver to the technique to patients.  Patients were asked to follow the script read allowed by LRT to engage in practicing the technique.  Education:  Stress Management, Discharge Planning.   Clinical Observations/Feedback: Pt did not attend group.   Tennis Mckinnon, LRT/CTRS  

## 2015-10-06 NOTE — Progress Notes (Signed)
Patient ID: Amy Le, female   DOB: 03-Jun-1975, 40 y.o.   MRN: [id number] [facility] MD Progress Note  10/06/2015 2:09 PM Amy Le  MRN:  [id number]   Subjective: Amy Le reports " I'm still not sleeping well at night. My mood is going up & down on me because I'm still hearing some voices mumbling. I hope I get to be feeling a lot better soon because I will be going to the [facility] after discharge".  Objective: Amy Le is seen, chart reviewed. She is awake, alert and oriented X 4. She is attending & participating in the group sessions. Denies suicidal or homicidal ideation. Reports auditory hallucination that are quite mumbling throughout the day. Denies visual hallucination and does not appear to be responding to internal stimuli. Patient is seen interacting well with staff & the other patients. Patient reports she is medication compliant without mediation side effects. Report learning new coping skills. Reports good appetite and states she is resting well. Support, encouragement and reassurance was provided.   Principal Problem: Cocaine abuse with cocaine-induced mood disorder (HCC) Diagnosis:   Patient Active Problem List   Diagnosis Date Noted  . Cocaine abuse with cocaine-induced mood disorder [facility]) [F14.14] 09/29/2015   Total Time spent with patient: 15 minutes  Past Psychiatric History: Cocaine dependence.  Past Medical History:  Past Medical History:  Diagnosis Date  . BV (bacterial vaginosis)   . Chlamydia   . Hypertension   . Trichomonas     Past Surgical History:  Procedure Laterality Date  . FOOT SURGERY    . TUBAL LIGATION     Family History: History reviewed. No pertinent family history.  Family Psychiatric  History: See H&P  Social History:  History  Alcohol Use No     History  Drug Use  . Types: Cocaine, "Crack" cocaine, Marijuana    Comment: been using daily since last week..    Social History   Social History   . Marital status: Married    Spouse name: N/A  . Number of children: N/A  . Years of education: N/A   Social History Main Topics  . Smoking status: Current Every Day Smoker    Packs/day: 0.50    Types: Cigarettes  . Smokeless tobacco: None  . Alcohol use No  . Drug use:     Types: Cocaine, "Crack" cocaine, Marijuana     Comment: been using daily since last week..  . Sexual activity: Not Asked   Other Topics Concern  . None   Social History Narrative  . None   Additional Social History:   Sleep: Poor  Appetite:  Fair  Current Medications: Current Facility-Administered Medications  Medication Dose Route Frequency Provider Last Rate Last Dose  . acetaminophen (TYLENOL) tablet 650 mg  650 mg Oral Q6H PRN Charm Rings, NP   650 mg at 09/29/15 2200  . alum & mag hydroxide-simeth (MAALOX/MYLANTA) 200-200-20 MG/5ML suspension 30 mL  30 mL Oral Q4H PRN Charm Rings, NP   30 mL at 10/06/15 0617  . gabapentin (NEURONTIN) capsule 600 mg  600 mg Oral QID Sanjuana Kava, NP   600 mg at 10/06/15 1151  . hydrocerin (EUCERIN) cream   Topical BID Oneta Rack, NP      . hydrOXYzine (ATARAX/VISTARIL) tablet 25 mg  25 mg Oral Q6H PRN Oneta Rack, NP   25 mg at 10/05/15 2105  . magnesium hydroxide (MILK OF MAGNESIA) suspension 30 mL  30 mL Oral Daily  PRN Charm Rings, NP      . mirtazapine (REMERON) tablet 15 mg  15 mg Oral QHS Oneta Rack, NP   15 mg at 10/05/15 2105  . nicotine (NICODERM CQ - dosed in mg/24 hours) patch 21 mg  21 mg Transdermal Daily Craige Cotta, MD   21 mg at 10/03/15 0849  . nitrofurantoin (macrocrystal-monohydrate) (MACROBID) capsule 100 mg  100 mg Oral Q12H Adonis Brook, NP   100 mg at 10/06/15 0830  . QUEtiapine (SEROQUEL) tablet 100 mg  100 mg Oral BID WC Charm Rings, NP   100 mg at 10/06/15 1150  . QUEtiapine (SEROQUEL) tablet 400 mg  400 mg Oral QHS Charm Rings, NP   400 mg at 10/05/15 2105  . sertraline (ZOLOFT) tablet 150 mg  150 mg Oral  Daily Craige Cotta, MD   150 mg at 10/06/15 0960   Lab Results:  No results found for this or any previous visit (from the past 48 hour(s)).  Blood Alcohol level:  Lab Results  Component Value Date   ETH <5 09/28/2015   Metabolic Disorder Labs: No results found for: HGBA1C, MPG No results found for: PROLACTIN No results found for: CHOL, TRIG, HDL, CHOLHDL, VLDL, LDLCALC  Physical Findings: AIMS: Facial and Oral Movements Muscles of Facial Expression: None, normal Lips and Perioral Area: None, normal Jaw: None, normal Tongue: None, normal,Extremity Movements Upper (arms, wrists, hands, fingers): None, normal Lower (legs, knees, ankles, toes): None, normal, Trunk Movements Neck, shoulders, hips: None, normal, Overall Severity Severity of abnormal movements (highest score from questions above): None, normal Incapacitation due to abnormal movements: None, normal Patient's awareness of abnormal movements (rate only patient's report): No Awareness, Dental Status Current problems with teeth and/or dentures?: No Does patient usually wear dentures?: No  CIWA:  CIWA-Ar Total: 1 COWS:  COWS Total Score: 2  Musculoskeletal: Strength & Muscle Tone: within normal limits Gait & Station: normal Patient leans: N/A  Psychiatric Specialty Exam: Physical Exam  Nursing note and vitals reviewed. Constitutional: She is oriented to person, place, and time. She appears well-developed.  HENT:  Head: Normocephalic.  Eyes: Pupils are equal, round, and reactive to light.  Neck: Normal range of motion.  Cardiovascular: Normal rate.   Respiratory: Effort normal.  GI: Soft.  Genitourinary:  Genitourinary Comments: Denies any issues in this area.  Musculoskeletal: Normal range of motion.  Neurological: She is alert and oriented to person, place, and time.  Psychiatric: She has a normal mood and affect. Her behavior is normal.    Review of Systems  Constitutional: Negative.   HENT:  Negative.   Eyes: Negative.   Respiratory: Negative.   Cardiovascular: Negative.   Gastrointestinal: Negative.   Genitourinary: Negative.   Musculoskeletal: Negative.   Skin: Negative.   Neurological: Negative.   Endo/Heme/Allergies: Negative.   Psychiatric/Behavioral: Positive for depression and substance abuse. Negative for hallucinations, memory loss and suicidal ideas. The patient is nervous/anxious and has insomnia (Currently on Remeron 15 mg.).     Blood pressure 111/68, pulse 100, temperature 98.5 F (36.9 C), temperature source Oral, resp. rate 16, height 5\' 6"  (1.676 m), weight 97.1 kg (214 lb), last menstrual period 09/28/2015, SpO2 100 %.Body mass index is 34.54 kg/m.  General Appearance: Guarded  Eye Contact:  Minimal  Speech:  Clear and Coherent  Volume:  Normal  Mood:  Anxious and Depressed  Affect:  Congruent  Thought Process:  Coherent  Orientation:  Full (Time, Place, and Person)  Thought Content:  Hallucinations: None  Suicidal Thoughts:  No  Homicidal Thoughts:  No  Memory:  Immediate;   Fair Recent;   Fair Remote;   Fair  Judgement:  Intact  Insight:  Fair  Psychomotor Activity:  Restlessness- improving patient is more visible on the unit.  Concentration:  Concentration: Good  Recall:  [employer] of Knowledge:  Fair  Language:  Fair  Akathisia:  No  Handed:  Right  AIMS (if indicated):     Assets:  Desire for Improvement Resilience Social Support  ADL's:  Intact  Cognition:  WNL  Sleep:  Number of Hours: 3   Treatment Plan Summary: Daily contact with patient to assess and evaluate symptoms and progress in treatment and Medication management  Continue Seroquel 100 mgrs Q AM & 400 mgrs Q HS for depression/mood control.  Continue Remeron 15 mg PO Q HS for insomnia  Continue Zoloft 100 mgrs QDAY for depression,  Continue Neurontin 300 mgrs QID for agitation  Encourage participation in the Group counseling sessions to learn coping skills. Medical  consults as needed Review and reinstate any pertinent home medications for other health problems.  Signed: Sanjuana Kava, PMHNP, FNP-BC.

## 2015-10-06 NOTE — Progress Notes (Signed)
D: Pt endorsed severe anxiety and severe depression; states, "I miss my son, I miss him a lot." Pt also endorsed severe R. foot pain. Pt however, denied SI, HI, or AVH." Pt was appropriate on the unit, remained calm and cooperative. A: Medications offered as prescribed.  Support, encouragement, and safe environment provided.  15-minute safety checks continue. R: Pt was med compliant.  Pt attended AA group meeting. Safety checks continue.

## 2015-10-06 NOTE — Progress Notes (Signed)
Pt attended evening AA group. 

## 2015-10-07 DIAGNOSIS — F14251 Cocaine dependence with cocaine-induced psychotic disorder with hallucinations: Secondary | ICD-10-CM | POA: Diagnosis present

## 2015-10-07 DIAGNOSIS — F1494 Cocaine use, unspecified with cocaine-induced mood disorder: Secondary | ICD-10-CM | POA: Diagnosis present

## 2015-10-07 LAB — GC/CHLAMYDIA PROBE AMP (~~LOC~~) NOT AT ARMC
Chlamydia: NEGATIVE
NEISSERIA GONORRHEA: NEGATIVE

## 2015-10-07 MED ORDER — MIRTAZAPINE 15 MG PO TABS
15.0000 mg | ORAL_TABLET | Freq: Every day | ORAL | Status: DC
  Filled 2015-10-07: qty 14

## 2015-10-07 MED ORDER — HYDROXYZINE HCL 25 MG PO TABS
25.0000 mg | ORAL_TABLET | Freq: Four times a day (QID) | ORAL | Status: DC | PRN
  Filled 2015-10-07: qty 20

## 2015-10-07 MED ORDER — QUETIAPINE FUMARATE 100 MG PO TABS
100.0000 mg | ORAL_TABLET | Freq: Two times a day (BID) | ORAL | Status: DC
Start: 2015-10-08 — End: 2015-10-07
  Filled 2015-10-07: qty 28

## 2015-10-07 MED ORDER — QUETIAPINE FUMARATE 400 MG PO TABS
400.0000 mg | ORAL_TABLET | Freq: Every day | ORAL | Status: DC
  Filled 2015-10-07: qty 14

## 2015-10-07 MED ORDER — QUETIAPINE FUMARATE 100 MG PO TABS: 100.0000 mg | ORAL_TABLET | Freq: Two times a day (BID) | ORAL | 0 refills | Status: DC

## 2015-10-07 MED ORDER — QUETIAPINE FUMARATE 400 MG PO TABS: 400.0000 mg | ORAL_TABLET | Freq: Every day | ORAL | 0 refills | Status: DC

## 2015-10-07 MED ORDER — GABAPENTIN 300 MG PO CAPS
600.0000 mg | ORAL_CAPSULE | Freq: Four times a day (QID) | ORAL | Status: DC
  Filled 2015-10-07 (×3): qty 56

## 2015-10-07 MED ORDER — SERTRALINE HCL 50 MG PO TABS: 150.0000 mg | ORAL_TABLET | Freq: Every day | ORAL | 0 refills | Status: DC

## 2015-10-07 MED ORDER — HYDROCERIN EX CREA: 1.0000 "application " | TOPICAL_CREAM | Freq: Two times a day (BID) | CUTANEOUS | 0 refills | Status: DC

## 2015-10-07 MED ORDER — GABAPENTIN 300 MG PO CAPS: 600.0000 mg | ORAL_CAPSULE | Freq: Four times a day (QID) | ORAL | 0 refills | Status: DC

## 2015-10-07 MED ORDER — NICOTINE 21 MG/24HR TD PT24: 21.0000 mg | MEDICATED_PATCH | Freq: Every day | TRANSDERMAL | 0 refills | Status: DC

## 2015-10-07 MED ORDER — GABAPENTIN 600 MG PO TABS
600.0000 mg | ORAL_TABLET | Freq: Four times a day (QID) | ORAL | Status: DC
  Filled 2015-10-07 (×7): qty 1

## 2015-10-07 MED ORDER — SERTRALINE HCL 50 MG PO TABS
150.0000 mg | ORAL_TABLET | Freq: Every day | ORAL | Status: DC
  Filled 2015-10-07 (×2): qty 21

## 2015-10-07 MED ORDER — HYDROXYZINE HCL 25 MG PO TABS: 25.0000 mg | ORAL_TABLET | Freq: Four times a day (QID) | ORAL | 0 refills | Status: DC | PRN

## 2015-10-07 MED ORDER — MIRTAZAPINE 15 MG PO TABS: 15.0000 mg | ORAL_TABLET | Freq: Every day | ORAL | 0 refills | Status: DC

## 2015-10-07 NOTE — [facility] Group Notes (Signed)
[facility] LCSW Group Therapy  10/07/2015 3:36 PM  Type of Therapy:  Group Therapy  Participation Level:  Active  Participation Quality:  Attentive  Affect:  Appropriate  Cognitive:  Alert and Oriented  Insight:  Improving  Engagement in Therapy:  Improving  Modes of Intervention:  Confrontation, Discussion, Education, Problem-solving, Rapport Building, Socialization and Support  Summary of Progress/Problems: MHA Speaker came to talk about his personal journey with substance abuse and addiction. The pt processed ways by which to relate to the speaker. MHA speaker provided handouts and educational information pertaining to groups and services offered by the [facility].   Smart, Parmvir Boomer LCSW 10/07/2015, 3:36 PM

## 2015-10-07 NOTE — BHH Suicide Risk Assessment (Signed)
[facility] Discharge Suicide Risk Assessment   Principal Problem: Cocaine abuse with cocaine-induced mood disorder [facility]) Discharge Diagnoses:  Patient Active Problem List   Diagnosis Date Noted  . Cocaine abuse with cocaine-induced mood disorder [facility]) [F14.14] 09/29/2015    Total Time spent with patient: 30 minutes  Musculoskeletal: Strength & Muscle Tone: within normal limits Gait & Station: normal Patient leans: N/A  Psychiatric Specialty Exam: Review of Systems  Psychiatric/Behavioral: Positive for substance abuse. Negative for depression and suicidal ideas. The patient does not have insomnia.   All other systems reviewed and are negative.   Blood pressure (!) 99/48, pulse (!) 108, temperature 98.8 F (37.1 C), temperature source Oral, resp. rate 12, height 5\' 6"  (1.676 m), weight 97.1 kg (214 lb), last menstrual period 09/28/2015, SpO2 100 %.Body mass index is 34.54 kg/m.  General Appearance: Casual  Eye Contact::  Fair  Speech:  Clear and Coherent409  Volume:  Normal  Mood:  Euthymic  Affect:  Appropriate  Thought Process:  Goal Directed and Descriptions of Associations: Intact  Orientation:  Full (Time, Place, and Person)  Thought Content:  WDL  Suicidal Thoughts:  No  Homicidal Thoughts:  No  Memory:  Immediate;   Fair Recent;   Fair Remote;   Fair  Judgement:  Fair  Insight:  Shallow  Psychomotor Activity:  Normal  Concentration:  Fair  Recall:  [employer] of Knowledge:Fair  Language: Fair  Akathisia:  No  Handed:  Right  AIMS (if indicated):     Assets:  Desire for Improvement  Sleep:  Number of Hours: 3  Cognition: WNL  ADL's:  Intact   Mental Status Per Nursing Assessment::   On Admission:  Self-harm thoughts  Demographic Factors:  NA  Loss Factors: Financial problems/change in socioeconomic status  Historical Factors: Impulsivity  Risk Reduction Factors:   Positive social support  Continued Clinical Symptoms:  Alcohol/Substance  Abuse/Dependencies  Cognitive Features That Contribute To Risk:  None    Suicide Risk:  Minimal: No identifiable suicidal ideation.  Patients presenting with no risk factors but with morbid ruminations; may be classified as minimal risk based on the severity of the depressive symptoms  Follow-up Information    [facility] Follow up on 10/08/2015.   Why:  Screening for potential admission on this date at 12pm. Please bring proof of address (faces[facility]), medications, and clothing. If you have it, please bring social security card.  Contact information: [address] [place], [place] [address] Phone: [phone] Fax: [phone]       [facility] .   Specialty:  Behavioral Health Why:  [facility] Transitional Care Team will pick you up at 8:15am on Wed 10/08/15. TCT will assist with scheduling hospital follow-up through [facility].  Contact information: [address] [place] [address] [phone]        [facility] .   Why:  Please continue to follow-up with [facility] while at [facility] if interested in pursuing this placement option.  Contact information: [address], [place] [address] Phone: [phone] Fax: [phone]          Plan Of Care/Follow-up recommendations:  Activity:  No restrictions Diet:  regular Tests:  as needed Other:  follow up with after care  Cashlynn Yearwood, MD 10/07/2015, 3:26 PM

## 2015-10-07 NOTE — BHH Group Notes (Signed)
[facility] Group Notes:  (Nursing/MHT/Case Management/Adjunct)  Date:  10/07/2015  Time:  0900  Type of Therapy:  Nurse Education  Participation Level:  Active  Participation Quality:  Attentive  Affect:  Appropriate  Cognitive:  Appropriate  Insight:  Appropriate  Engagement in Group:  Engaged  Modes of Intervention:  Discussion, Education and Support  Summary of Progress/Problems: Ameliya shared that she is here to get her medication managed appropriately. She listened attentively.  Maurine Simmering 10/07/2015, 9:42 AM

## 2015-10-07 NOTE — Progress Notes (Signed)
D: Pt continued to endorse severe anxiety and severe depression; states, "I will be leaving soon; I know I am better prepared now but you never know." Pt also endorsed severe R. foot pain. Pt however, denied SI, HI, or AVH." Pt was appropriate on the unit, not too happy that her roommate left. Pt remained calm and cooperative. A: Medications offered as prescribed.  Support, encouragement, and safe environment provided. 15-minute safety checks continue. R: Pt was med compliant.  Pt attended AA group meeting. Safety checks continue.

## 2015-10-07 NOTE — Progress Notes (Signed)
D: Amy Le is prepared for dc to [facility] in the am. She rates her Anxiety 5/10. Denies SI/HI/AVH at this time. Contracts for safety.  A: Encouragement and support given. Q15 minute room checks for patient safety. Medications administered as prescribed.  R: Continue to monitor for patient safety and medication effectiveness.

## 2015-10-07 NOTE — Progress Notes (Signed)
  [facility] Adult Case Management Discharge Plan :  Will you be returning to the same living situation after discharge:  No. Pt plans to go to [facility] for screening/possible admission at 12pm on Wed.  At discharge, do you have transportation home?: Yes,  [facility] will pick you up at 8:15AM for transport (Wed morning discharge)  Do you have the ability to pay for your medications: Yes,  [facility]  Release of information consent forms completed and submitted to medical records by CSW. Patient to Follow up at: Follow-up Information    [facility] Follow up on 10/08/2015.   Why:  Screening for potential admission on this date at 12pm. Please bring proof of address, medications, and clothing. If you have it, please bring social security card.  Contact information: [address] [place], [place] [address] Phone: [phone] Fax: [phone]       [facility] .   Specialty:  Behavioral Health Why:  Walk in between 8am-9am Monday through Friday for hospital follow-up/medication management/assessment for outpatient counseling services/substance abuse services if interested.  Contact information: [address] [place] [address] [phone]        [facility] .   Why:  Please continue to follow-up with [facility] while at [facility] if interested in pursuing this placement option.  Contact information: [address] [place], [place] [address] Phone: [phone] Fax: [phone]          Next level of care provider has access to [facility] Link:no  Safety Planning and Suicide Prevention discussed: Yes,  SPE completed with pt; pt declined to consent to family contact. SPI pamphlet and Mobile Crisis information provided to pt; pt was encouraged to share informtion with support network, ask questions, and talk about any concerns relating to SPE.  Have you used any form of tobacco in the last 30 days? (Cigarettes, Smokeless  Tobacco, Cigars, and/or Pipes): Yes  Has patient been referred to the Quitline?: Patient refused referral  Patient has been referred for addiction treatment: Yes  Smart, Blayke Pinera LCSW 10/07/2015, 2:32 PM

## 2015-10-07 NOTE — Tx Team (Signed)
Interdisciplinary Treatment Plan Update (Adult)  Date:  10/07/2015  Time Reviewed:  2:39 PM   Progress in Treatment: Attending groups:Yes Participating in groups: Yes Taking medication as prescribed:  Yes. Tolerating medication:  Yes. Family/Significant othe contact made:   Patient understands diagnosis:  Yes. and As evidenced by:  seeking treatment for panic/anxiety, medication stabilization. Pt reports "occassional cocaine use." pt BAL positive for cocaine, THC, and benzos.  Discussing patient identified problems/goals with staff:  Yes. Medical problems stabilized or resolved:  Yes. Denies suicidal/homicidal ideation: Yes. Issues/concerns per patient self-inventory:  Other:  Discharge Plan or Barriers: [facility] at [facility]. She is working to gather necessary items for admission to [facility] in [place], [place]. Pt has been accepted to [facility] and will be picked up at 8:15AM by Ronalee Belts for transport on Wed morning. CSW assisted pt with getting necessary paperwork for [facility] (criminal background, TB test results, ID, etc) and she plans to continue working on getting necessary items together while at [facility].    Reason for Continuation of Hospitalization: none  Comments:  Amy Le is an 40 y.o. female presenting voluntarily to [facility] for panic attacks and anxiety. Patient states that she was at the store and someone called 911 due to her having a panic attack. Patient states that she cannot remember if she asked them to call or not. Patient states that she has had anxiety attacks before but she cannot recall when was the last time or when they started. Patient denies SI and history of attempts. Patient denies self injurious behaviors. Patient denies HI and history of aggression. Patient denies access to firearms. Patient denies recent legal charges. Patient denies AVH. Patient states that she needs her medication but she cannot recall the name of the  medication or who prescribed the medication. Patient states that she uses cocaine "occassionally" and last used $60 worth of cocaine "a couple days ago." Patient denies use of other substances. Patient denies use of alcohol. Patient BAL <5 and UDS +cocaine, + THC, + Benzodiazepines. . Diagnosis: Polysubstance Induced Mood Disorder  Estimated length of stay:  D/c scheduled for Wed morning.   Additional Comments:  Patient and CSW reviewed pt's identified goals and treatment plan. Patient verbalized understanding and agreed to treatment plan. CSW reviewed [facility] "Discharge Process and Patient Involvement" Form. Pt verbalized understanding of information provided and signed form.    Review of initial/current patient goals per problem list:  1. Goal(s): Patient will participate in aftercare plan  Met: Yes   Target date: at discharge  As evidenced by: Patient will participate within aftercare plan AEB aftercare provider and housing plan at discharge being identified.  7/18: CSW assessing for appropriate referrals.   7/21: Pt plans to discharge with TCT to [facility] in order to apply for ID card. She then plans to go directly to [facility] at 12pm Wednesday for screening.   2. Goal(s): Patient will demonstrate decreased signs and symptoms of anxiety.  Met:Yes   Target date: at discharge  As evidenced by: Patient will utilize self rating of anxiety at 3 or below and demonstrated decreased signs of anxiety, or be deemed stable for discharge by MD  7/18: Pt rates anxiety as 10/10 and reports daily panic attacks. Denies SI/HI/AVH or depression.   7/21: Pt rates anxiety as 6/10 and presents with pleasant mood/slightly anxious affect. She reports that mood and anxiety are "improving."   7/25: Pt rates anxiety as 3/10 and presents with  pleasant mood/calm affect. Pt states that she feels like things are coming together and she is ready for discharge on Wed morning.   3. Goal(s): Patient  will demonstrate decreased signs of withdrawal due to substance abuse  Met:Yes  Target date:at discharge   As evidenced by: Patient will produce a CIWA/COWS score of 0, have stable vitals signs, and no symptoms of withdrawal.  7/18: Pt reports mild withdrawals with COWS of 3/CIWA of 7 and stable vitals. She is not on detox protocol.   7/21: Pt reports no signs of withdrawal with high BP/standing pulse and no CIWA/COWS score. Goal progressing.   7/25: Pt reports no signs of withdrawal. Recent vitals are stable. Goal met.   Attendees: Patient:   10/07/2015 2:39 PM   Family:   10/07/2015 2:39 PM   Physician:  Dr. Neita Garnet MD; Dr. Shea Evans MD 10/07/2015 2:39 PM   Nursing:   Lottie Mussel RN 10/07/2015 2:39 PM   Clinical Social Worker: Maxie Better, LCSW 10/07/2015 2:39 PM   Clinical Social Worker: Peri Maris LCSW; Erasmo Downer Drinkard, LCSW 10/07/2015 2:39 PM   Other:  Gerline Legacy Nurse Case Manager 10/07/2015 2:39 PM   Other:  Agustina Caroli NP 10/07/2015 2:39 PM   Other:   10/07/2015 2:39 PM   Other:  10/07/2015 2:39 PM   Other:  10/07/2015 2:39 PM   Other:  10/07/2015 2:39 PM    10/07/2015 2:39 PM    10/07/2015 2:39 PM    10/07/2015 2:39 PM    10/07/2015 2:39 PM    Scribe for Treatment Team:   Maxie Better, LCSW 10/07/2015 2:39 PM

## 2015-10-07 NOTE — Progress Notes (Signed)
Pt did not attend evening AA group, declined invitation.

## 2015-10-07 NOTE — Progress Notes (Signed)
D: Suniya denies SI/HI/AVH. This a.m., she put off taking her meds until 0951 because she said that her stomach has been a little upset in the past day or two in the a.m. She did not suggest a reason, but did not suspect pregnancy or new medication. She said her discomfort had improved later in the day. She has been calm/appropriate and looking forward to discharge early Wednesday. A: Meds given as ordered. Q15 safety checks maintained. Support/encouragement offered. R: Pt remains free from harm and continues with treatment. Will continue to monitor for needs/safety.

## 2015-10-08 DIAGNOSIS — F1423 Cocaine dependence with withdrawal: Secondary | ICD-10-CM

## 2015-10-08 DIAGNOSIS — F1424 Cocaine dependence with cocaine-induced mood disorder: Principal | ICD-10-CM
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
## 2015-10-08 NOTE — Progress Notes (Signed)
Patient ID: Amy Le, female   DOB: 01-Mar-1976, 40 y.o.   MRN: [id number] [name] was discharged to lobby, where ride awaited, per MD order. She had refused a.m. Seroquel, saying, "I already feel sleepy" and her nicotine patch. All other a.m. meds given. AVS, transition summary, SRA, scripts, belongings were all reviewed and signed for when relevant. Pt verbalized understanding. Pt could not find suicide safety plan and would not fill out a new one. Pt verbalized readiness for discharge and appeared euthymic/in no acute distress.

## 2015-10-08 NOTE — Progress Notes (Signed)
Recreation Therapy Notes  Date: 10/08/15 Time: 0930 Location: 300 Hall Group Room  Group Topic: Stress Management  Goal Area(s) Addresses:  Patient will verbalize importance of using healthy stress management.  Patient will identify positive emotions associated with healthy stress management.   Intervention: Stress Management  Activity :  Progressive Muscle Relaxation.  LRT introduced the technique of progressive muscle relaxation to the patients.  LRT read a script to the patients for them to engage in the activity.  Pt were encouraged to follow along as the LRT read the script.  Education:  Stress Management, Discharge Planning.    Clinical Observations/Feedback: Pt did not attend group.   Maddelyn Rocca, LRT/CTRS  

## 2015-10-08 NOTE — Progress Notes (Signed)
Pt's roommate's Marylene Land) husband brought in a navy colored bag to voluntarily give to pt on the unit (Amy L.) so that she would have a bag to take her clothes in to the treatment facility that she is going to. Writer notified patient's RN Shon Hale), who contacted [facility] to be sure this is okay. Navy bag was locked into Amy's locker for her to take upon discharge.

## 2015-10-28 ENCOUNTER — Encounter (HOSPITAL_BASED_OUTPATIENT_CLINIC_OR_DEPARTMENT_OTHER): Payer: Self-pay | Admitting: *Deleted

## 2015-10-28 ENCOUNTER — Emergency Department (HOSPITAL_BASED_OUTPATIENT_CLINIC_OR_DEPARTMENT_OTHER)
Admission: EM | Admit: 2015-10-28 | Discharge: 2015-10-28 | Disposition: A | Discharge status: Home / Self Care | Payer: Medicaid Other | Attending: Emergency Medicine | Admitting: Emergency Medicine

## 2015-10-28 DIAGNOSIS — F17210 Nicotine dependence, cigarettes, uncomplicated: Secondary | ICD-10-CM | POA: Diagnosis not present

## 2015-10-28 DIAGNOSIS — K0889 Other specified disorders of teeth and supporting structures: Secondary | ICD-10-CM | POA: Insufficient documentation

## 2015-10-28 DIAGNOSIS — Z79899 Other long term (current) drug therapy: Secondary | ICD-10-CM | POA: Insufficient documentation

## 2015-10-28 MED ORDER — ACETAMINOPHEN 500 MG PO TABS
1000.0000 mg | ORAL_TABLET | Freq: Once | ORAL | Status: AC
  Administered 2015-10-28: 1000 mg via ORAL
  Filled 2015-10-28: qty 2

## 2015-10-28 MED ORDER — PENICILLIN V POTASSIUM 500 MG PO TABS: 500.0000 mg | ORAL_TABLET | Freq: Four times a day (QID) | ORAL | 0 refills | Status: AC

## 2015-10-28 MED ORDER — IBUPROFEN 800 MG PO TABS
800.0000 mg | ORAL_TABLET | Freq: Once | ORAL | Status: AC
  Administered 2015-10-28: 800 mg via ORAL
  Filled 2015-10-28: qty 1

## 2015-10-28 MED ORDER — BUPIVACAINE-EPINEPHRINE (PF) 0.5% -1:200000 IJ SOLN
1.8000 mL | Freq: Once | INTRAMUSCULAR | Status: AC
  Administered 2015-10-28: 1.8 mL
  Filled 2015-10-28: qty 1.8

## 2015-10-28 NOTE — Discharge Instructions (Signed)
Take 4 over the counter ibuprofen tablets 3 times a day or 2 over-the-counter naproxen tablets twice a day for pain. Also take tylenol 1000mg(2 extra strength) four times a day.    

## 2015-10-28 NOTE — ED Provider Notes (Signed)
[facility] Provider Note   CSN: [id number] Arrival date & time: 10/28/15  1407     History   Chief Complaint Chief Complaint  Patient presents with  . Dental Pain    HPI Amy Le is a 40 y.o. female.  40 yo F with a chief complaint of right incisor pain this been going on for the past week or so. Patient has a history of diffuse dental caries. Denies fevers or chills denies nausea or vomiting. Patient has tried nothing for this at home. Denies radiation. Is planning on seeing a dentist in the near future for extraction of all of her teeth.   The history is provided by the patient.  Dental Pain   This is a chronic problem. The current episode started more than 2 days ago. The problem occurs constantly. The problem has been gradually worsening. The pain is at a severity of 10/10. The pain is severe. She has tried nothing for the symptoms. The treatment provided no relief.    Past Medical History:  Diagnosis Date  . BV (bacterial vaginosis)   . Chlamydia   . Trichomonas     Patient Active Problem List   Diagnosis Date Noted  . Cocaine use disorder, severe, dependence (HCC) 10/07/2015  . 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 FOOT SURGERY    . TUBAL LIGATION      OB History    No data available       Home Medications    Prior to Admission medications   Medication Sig Start Date End Date Taking? Authorizing Provider  gabapentin (NEURONTIN) 300 MG capsule Take 2 capsules (600 mg total) by mouth 4 (four) times daily. For agitation 10/07/15  Yes Sanjuana KavaAgnes I Nwoko, NP  mirtazapine (REMERON) 15 MG tablet Take 1 tablet (15 mg total) by mouth at bedtime. For depression/insomnia 10/07/15  Yes Sanjuana KavaAgnes I Nwoko, NP  QUEtiapine (SEROQUEL) 100 MG tablet Take 1 tablet (100 mg total) by mouth 2 (two) times daily with  breakfast and lunch. For agitation 10/07/15  Yes Sanjuana KavaAgnes I Nwoko, NP  sertraline (ZOLOFT) 50 MG tablet Take 3 tablets (150 mg total) by mouth daily. For depression 10/07/15  Yes Sanjuana KavaAgnes I Nwoko, NP  acetaminophen (TYLENOL) 500 MG tablet Take 500 mg by mouth every 6 (six) hours as needed for mild pain or moderate pain.    Historical Provider, MD  hydrocerin (EUCERIN) CREA Apply 1 application topically 2 (two) times daily. For dry skin 10/07/15   Sanjuana KavaAgnes I Nwoko, NP  hydrOXYzine (ATARAX/VISTARIL) 25 MG tablet Take 1 tablet (25 mg total) by mouth every 6 (six) hours as needed for anxiety. 10/07/15   Sanjuana KavaAgnes I Nwoko, NP  nicotine (NICODERM CQ - DOSED IN MG/24 HOURS) 21 mg/24hr patch Place 1 patch (21 mg total) onto the skin daily. For smoking cessation 10/07/15   Sanjuana KavaAgnes I Nwoko, NP  penicillin v potassium (VEETID) 500 MG tablet Take 1 tablet (500 mg total) by mouth 4 (four) times daily. 10/28/15 11/07/15  Melene Planan Breaunna Gottlieb, DO  QUEtiapine (SEROQUEL) 400 MG tablet Take 1 tablet (400 mg total) by mouth at bedtime. For mood control 10/07/15   Sanjuana KavaAgnes I Nwoko, NP    Family History No family history on file.  Social History Social History  Substance Use Topics  . Smoking status: Current Every Day Smoker    Packs/day: 0.50  Types: Cigarettes  . Smokeless tobacco: Never Used  . Alcohol use No     Allergies   Review of patient's allergies indicates no known allergies.   Review of Systems Review of Systems  Constitutional: Negative for chills and fever.  HENT: Positive for dental problem. Negative for congestion and rhinorrhea.   Eyes: Negative for redness and visual disturbance.  Respiratory: Negative for shortness of breath and wheezing.   Cardiovascular: Negative for chest pain and palpitations.  Gastrointestinal: Negative for nausea and vomiting.  Genitourinary: Negative for dysuria and urgency.  Musculoskeletal: Negative for arthralgias and myalgias.  Skin: Negative for pallor and wound.  Neurological:  Negative for dizziness and headaches.     Physical Exam Updated Vital Signs BP 105/60   Pulse 88   Temp 98.1 F (36.7 C) (Oral)   Resp 18   Ht 5\' 6"  (1.676 m)   Wt 214 lb (97.1 kg)   LMP 10/27/2015   SpO2 100%   BMI 34.54 kg/m   Physical Exam  Constitutional: She is oriented to person, place, and time. She appears well-developed and well-nourished. No distress.  HENT:  Head: Normocephalic and atraumatic.  Mouth/Throat: Dental caries (diffuse) present.    Eyes: EOM are normal. Pupils are equal, round, and reactive to light.  Neck: Normal range of motion. Neck supple.  Cardiovascular: Normal rate and regular rhythm.  Exam reveals no gallop and no friction rub.   No murmur heard. Pulmonary/Chest: Effort normal. She has no wheezes. She has no rales.  Abdominal: Soft. She exhibits no distension. There is no tenderness.  Musculoskeletal: She exhibits no edema or tenderness.  Neurological: She is alert and oriented to person, place, and time.  Skin: Skin is warm and dry. She is not diaphoretic.  Psychiatric: She has a normal mood and affect. Her behavior is normal.  Nursing note and vitals reviewed.    ED Treatments / Results  Labs (all labs ordered are listed, but only abnormal results are displayed) Labs Reviewed - No data to display  EKG  EKG Interpretation None       Radiology No results found.  Procedures .Nerve Block Date/Time: 10/28/2015 3:28 PM Performed by: [name] Christohper Dube Authorized by: Melene Plan   Consent:    Consent obtained:  Verbal   Consent given by:  Patient   Risks discussed:  Allergic reaction, infection, nerve damage, swelling, intravenous injection and bleeding   Alternatives discussed:  No treatment Indications:    Indications:  Pain relief Location:    Body area:  Head   Head nerve blocked: Anterior superior alveolar nerve block.   Laterality:  Right Skin anesthesia (see MAR for exact dosages):    Skin anesthesia method:   None Procedure details (see MAR for exact dosages):    Block needle gauge:  27 G   Anesthetic injected:  Bupivacaine 0.25% WITH epi   Steroid injected:  None   Additive injected:  None   Injection procedure:  Anatomic landmarks identified and anatomic landmarks palpated   Paresthesia:  None Post-procedure details:    Dressing:  None   Outcome:  Pain relieved   Patient tolerance of procedure:  Tolerated well, no immediate complications   (including critical care time)  Medications Ordered in ED Medications  acetaminophen (TYLENOL) tablet 1,000 mg (1,000 mg Oral Given 10/28/15 1447)  ibuprofen (ADVIL,MOTRIN) tablet 800 mg (800 mg Oral Given 10/28/15 1447)  bupivacaine-epinephrine (MARCAINE W/ EPI) 0.5% -1:200000 injection 1.8 mL (1.8 mLs Infiltration Given 10/28/15 1448)  Initial Impression / Assessment and Plan / ED Course  I have reviewed the triage vital signs and the nursing notes.  Pertinent labs & imaging results that were available during my care of the patient were reviewed by me and considered in my medical decision making (see chart for details).  Clinical Course    40 yo F with a cc of dental pain.  Will block.    3:29 PM:  I have discussed the diagnosis/risks/treatment options with the patient and believe the pt to be eligible for discharge home to follow-up with Dentist. We also discussed returning to the ED immediately if new or worsening sx occur. We discussed the sx which are most concerning (e.g., sudden worsening pain, fever, inability to tolerate by mouth) that necessitate immediate return. Medications administered to the patient during their visit and any new prescriptions provided to the patient are listed below.  Medications given during this visit Medications  acetaminophen (TYLENOL) tablet 1,000 mg (1,000 mg Oral Given 10/28/15 1447)  ibuprofen (ADVIL,MOTRIN) tablet 800 mg (800 mg Oral Given 10/28/15 1447)  bupivacaine-epinephrine (MARCAINE W/ EPI) 0.5%  -1:200000 injection 1.8 mL (1.8 mLs Infiltration Given 10/28/15 1448)     The patient appears reasonably screen and/or stabilized for discharge and I doubt any other medical condition or other [facility] requiring further screening, evaluation, or treatment in the ED at this time prior to discharge.    Final Clinical Impressions(s) / ED Diagnoses   Final diagnoses:  Pain, dental    New Prescriptions Discharge Medication List as of 10/28/2015  3:03 PM    START taking these medications   Details  penicillin v potassium (VEETID) 500 MG tablet Take 1 tablet (500 mg total) by mouth 4 (four) times daily., Starting Tue 10/28/2015, Until Fri 11/07/2015, Print         Melene Planan Yaffa Seckman, DO 10/28/15 1529

## 2015-10-28 NOTE — ED Triage Notes (Signed)
[facility]. She needs a Rx for dental pain.

## 2015-11-02 ENCOUNTER — Encounter (HOSPITAL_BASED_OUTPATIENT_CLINIC_OR_DEPARTMENT_OTHER): Payer: Self-pay | Admitting: *Deleted

## 2015-11-02 ENCOUNTER — Emergency Department (HOSPITAL_BASED_OUTPATIENT_CLINIC_OR_DEPARTMENT_OTHER)
Admission: EM | Admit: 2015-11-02 | Discharge: 2015-11-02 | Disposition: A | Discharge status: Home / Self Care | Payer: Medicaid Other | Attending: Emergency Medicine | Admitting: Emergency Medicine

## 2015-11-02 DIAGNOSIS — R0602 Shortness of breath: Secondary | ICD-10-CM | POA: Diagnosis not present

## 2015-11-02 DIAGNOSIS — Z87891 Personal history of nicotine dependence: Secondary | ICD-10-CM | POA: Diagnosis not present

## 2015-11-02 DIAGNOSIS — Z79899 Other long term (current) drug therapy: Secondary | ICD-10-CM | POA: Insufficient documentation

## 2015-11-02 DIAGNOSIS — R220 Localized swelling, mass and lump, head: Secondary | ICD-10-CM | POA: Diagnosis not present

## 2015-11-02 DIAGNOSIS — K089 Disorder of teeth and supporting structures, unspecified: Secondary | ICD-10-CM | POA: Insufficient documentation

## 2015-11-02 DIAGNOSIS — Z792 Long term (current) use of antibiotics: Secondary | ICD-10-CM | POA: Diagnosis not present

## 2015-11-02 DIAGNOSIS — Z791 Long term (current) use of non-steroidal anti-inflammatories (NSAID): Secondary | ICD-10-CM | POA: Insufficient documentation

## 2015-11-02 MED ORDER — FAMOTIDINE IN NACL 20-0.9 MG/50ML-% IV SOLN
20.0000 mg | Freq: Once | INTRAVENOUS | Status: AC
  Administered 2015-11-02: 20 mg via INTRAVENOUS
  Filled 2015-11-02: qty 50

## 2015-11-02 MED ORDER — PREDNISONE 20 MG PO TABS: 40.0000 mg | ORAL_TABLET | Freq: Every day | ORAL | 0 refills | Status: DC

## 2015-11-02 MED ORDER — ACETAMINOPHEN 325 MG PO TABS
650.0000 mg | ORAL_TABLET | Freq: Once | ORAL | Status: AC
  Administered 2015-11-02: 650 mg via ORAL
  Filled 2015-11-02: qty 2

## 2015-11-02 MED ORDER — DIPHENHYDRAMINE HCL 50 MG/ML IJ SOLN
25.0000 mg | Freq: Once | INTRAMUSCULAR | Status: AC
  Administered 2015-11-02: 25 mg via INTRAVENOUS
  Filled 2015-11-02: qty 1

## 2015-11-02 MED ORDER — METHYLPREDNISOLONE SODIUM SUCC 125 MG IJ SOLR
125.0000 mg | Freq: Once | INTRAMUSCULAR | Status: AC
  Administered 2015-11-02: 125 mg via INTRAVENOUS
  Filled 2015-11-02: qty 2

## 2015-11-02 NOTE — ED Provider Notes (Signed)
[facility] Provider Note   CSN: [id number] Arrival date & time: 11/02/15  4098     History   Chief Complaint Chief Complaint  Patient presents with  . Facial Swelling    HPI Amy Le is a 40 y.o. female.  Patient is a 40 year old female who presents the ED with complaint of facial swelling, onset yesterday. Patient reports she had 6 teeth pulled 2 days ago and notes when she woke up yesterday morning she began having swelling to her lips and bilateral cheeks. Patient reports throughout the day and this morning the swelling has worsened. She notes she has been only taking ibuprofen and penicillin at home which she states she has taken before in the past. Denies any swelling of her tongue/mouth/throat but states that she feels like her breathing feels "different". Denies fever, chills, HA, lightheadedness, cough, CP, wheezing, palpitations, abdominal pain, N/V/D, rash, itching. Patient denies taking any new medications or eating new foods. Denies use of Ace inhibitors.      Past Medical History:  Diagnosis Date  . BV (bacterial vaginosis)   . Chlamydia   . Trichomonas     Patient Active Problem List   Diagnosis Date Noted  . Cocaine use disorder, severe, dependence (HCC) 10/07/2015  . 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 FOOT SURGERY    . MULTIPLE TOOTH EXTRACTIONS Bilateral 2017  . TUBAL LIGATION      OB History    No data available       Home Medications    Prior to Admission medications   Medication Sig Start Date End Date Taking? Authorizing Provider  acetaminophen (TYLENOL) 500 MG tablet Take 500 mg by mouth every 6 (six) hours as needed for mild pain or moderate pain.   Yes Historical Provider, MD  gabapentin (NEURONTIN) 300 MG capsule Take 2 capsules (600 mg total) by mouth 4 (four) times  daily. For agitation 10/07/15  Yes Sanjuana Kava, NP  hydrocerin (EUCERIN) CREA Apply 1 application topically 2 (two) times daily. For dry skin 10/07/15  Yes Sanjuana Kava, NP  hydrOXYzine (ATARAX/VISTARIL) 25 MG tablet Take 1 tablet (25 mg total) by mouth every 6 (six) hours as needed for anxiety. 10/07/15  Yes Sanjuana Kava, NP  ibuprofen (ADVIL,MOTRIN) 200 MG tablet Take 800 mg by mouth every 6 (six) hours as needed (pain).   Yes Historical Provider, MD  mirtazapine (REMERON) 15 MG tablet Take 1 tablet (15 mg total) by mouth at bedtime. For depression/insomnia 10/07/15  Yes Sanjuana Kava, NP  penicillin v potassium (VEETID) 500 MG tablet Take 1 tablet (500 mg total) by mouth 4 (four) times daily. 10/28/15 11/07/15 Yes Melene Plan, DO  QUEtiapine (SEROQUEL) 100 MG tablet Take 1 tablet (100 mg total) by mouth 2 (two) times daily with breakfast and lunch. For agitation 10/07/15  Yes Sanjuana Kava, NP  QUEtiapine (SEROQUEL) 400 MG tablet Take 1 tablet (400 mg total) by mouth at bedtime. For mood control 10/07/15  Yes Sanjuana Kava, NP  sertraline (ZOLOFT) 50 MG tablet Take 3 tablets (150 mg total) by mouth daily. For depression 10/07/15  Yes Sanjuana Kava, NP  nicotine (NICODERM CQ - DOSED IN MG/24 HOURS) 21 mg/24hr patch Place 1 patch (21 mg total) onto the skin daily. For smoking cessation 10/07/15   Sanjuana Kava, NP  predniSONE (DELTASONE)  20 MG tablet Take 2 tablets (40 mg total) by mouth daily. 11/02/15   Barrett HenleNicole Elizabeth Braydn Carneiro, PA-C    Family History No family history on file.  Social History Social History  Substance Use Topics  . Smoking status: Former Smoker    Packs/day: 0.50    Types: Cigarettes  . Smokeless tobacco: Never Used  . Alcohol use No     Allergies   Review of patient's allergies indicates no known allergies.   Review of Systems Review of Systems  HENT: Positive for facial swelling.   Respiratory: Positive for shortness of breath.   All other systems reviewed and are  negative.    Physical Exam Updated Vital Signs BP 124/70 (BP Location: Left Arm)   Pulse 79   Temp 98.6 F (37 C) (Oral)   Resp 14   Ht 5\' 6"  (1.676 m)   Wt 98.4 kg   LMP 10/27/2015   SpO2 98%   BMI 35.02 kg/m   Physical Exam  Constitutional: She is oriented to person, place, and time. She appears well-developed and well-nourished. No distress.  HENT:  Head: Normocephalic and atraumatic.  Nose: Nose normal.  Mouth/Throat: Uvula is midline, oropharynx is clear and moist and mucous membranes are normal. No trismus in the jaw. Abnormal dentition. No uvula swelling. No oropharyngeal exudate, posterior oropharyngeal edema, posterior oropharyngeal erythema or tonsillar abscesses.  Poor dentition noted throughout with 6 extracted teeth noted to upper midline and right upper maxilla. Moderate swelling and tenderness noted to gingiva without any bleeding, fluctuance or drainage noted. No evidence of abscess.  No swelling of tongue. Floor of mouth soft.  Moderate swelling noted to upper lid and bilateral maxillary regions (right slightly worse than left).  Eyes: Conjunctivae and EOM are normal. Right eye exhibits no discharge. Left eye exhibits no discharge. No scleral icterus.  Neck: Normal range of motion. Neck supple.  Cardiovascular: Normal rate, regular rhythm, normal heart sounds and intact distal pulses.   Pulmonary/Chest: Effort normal and breath sounds normal. No stridor. No respiratory distress. She has no wheezes. She has no rales. She exhibits no tenderness.  Abdominal: Soft. Bowel sounds are normal. She exhibits no distension and no mass. There is no tenderness. There is no rebound and no guarding. No hernia.  Musculoskeletal: She exhibits no edema.  Lymphadenopathy:    She has no cervical adenopathy.  Neurological: She is alert and oriented to person, place, and time.  Skin: Skin is warm and dry. No rash noted. She is not diaphoretic.  Nursing note and vitals  reviewed.    ED Treatments / Results  Labs (all labs ordered are listed, but only abnormal results are displayed) Labs Reviewed - No data to display  EKG  EKG Interpretation  Date/Time:  Sunday November 02 2015 11:36:37 EDT Ventricular Rate:  77 PR Interval:    QRS Duration: 88 QT Interval:  391 QTC Calculation: 443 R Axis:   80 Text Interpretation:  Sinus rhythm Consider left ventricular hypertrophy No significant change was found Confirmed by Manus GunningANCOUR  MD, STEPHEN ([id number]) on 11/02/2015 11:40:17 AM       Radiology No results found.  Procedures Procedures (including critical care time)  Medications Ordered in ED Medications  diphenhydrAMINE (BENADRYL) injection 25 mg (25 mg Intravenous Given 11/02/15 1016)  methylPREDNISolone sodium succinate (SOLU-MEDROL) 125 mg/2 mL injection 125 mg (125 mg Intravenous Given 11/02/15 1019)  famotidine (PEPCID) IVPB 20 mg premix (0 mg Intravenous Stopped 11/02/15 1127)  acetaminophen (TYLENOL) tablet 650 mg (  650 mg Oral Given 11/02/15 1057)     Initial Impression / Assessment and Plan / ED Course  I have reviewed the triage vital signs and the nursing notes.  Pertinent labs & imaging results that were available during my care of the patient were reviewed by me and considered in my medical decision making (see chart for details).  Clinical Course    Pt presents with worsening lip and facial swelling over the past 2 days. She reports having 6 teeth extracted 2 days ago and reports being on ibuprofen and penicillin which she has taken before. Reports mild SOB. Denies tongue swelling, wheezing, sensation of throat closing. VSS. Exam revealed moderate swelling to upper lip and maxillary region. No trismus, stridor, wheezing. Pt tolerating secretions and no signs of respiratory distress. No evidence of abscess noted in mouth exam however swelling and erythema noted to gingiva of extracted teeth, no active bleeding. Denies use of ACEi or any other  new medications. Pt given benadryl, steroids and pepcid in the ED. On reevaluation, pt denies any SOB. Mild improvement noted to swelling of upper lip. After 3 hours of monitoring the pt in the ED, pt continues to remain hemodynamically stable with mild improvement of her swelling. I suspect pt's swelling is likely due to her recent teeth extractions and do no suspect angioedema or anaphylactic reaction requiring further management or tx at this time. Plan to d/c pt home with steroids and symptomatic tx. Advised pt to follow up with her dentist. Discussed strict return precautions with pt.   Final Clinical Impressions(s) / ED Diagnoses   Final diagnoses:  Facial swelling    New Prescriptions Discharge Medication List as of 11/02/2015 12:38 PM    START taking these medications   Details  predniSONE (DELTASONE) 20 MG tablet Take 2 tablets (40 mg total) by mouth daily., Starting Sun 11/02/2015, Print         [place] CityNicole Elizabeth Darica Goren, PA-C 11/02/15 1720    Glynn OctaveStephen Rancour, MD 11/05/15 [phone]

## 2015-11-02 NOTE — ED Notes (Signed)
Pt's swelling has improved. Denies sob, mouth/tongue swelling at time of discharge.

## 2015-11-02 NOTE — ED Triage Notes (Signed)
Pt reports having 6 teeth extracted this past Friday. Noted some facial swelling that began yesterday morning and has worsened since then. Pt has swelling to bil eyes, bil cheeks and lips. Denies fever, sob, n/v/d.

## 2015-11-02 NOTE — ED Notes (Signed)
MD at bedside. 

## 2015-11-02 NOTE — Discharge Instructions (Signed)
Continue taking her ibuprofen and penicillin as prescribed. I also recommend taking the steroids as prescribed to help with her swelling. Continue to apply ice to affected area for 15-20 minutes 3-4 times daily. I recommend following up with your dentist in the next 2-3 days. Please follow up with a primary care provider from the Resource Guide provided below in fever, worsening facial swelling, swelling of mouth/throat/tongue, difficulty breathing, wheezing, rash, itching.

## 2016-03-05 ENCOUNTER — Emergency Department (HOSPITAL_BASED_OUTPATIENT_CLINIC_OR_DEPARTMENT_OTHER)
Admission: EM | Admit: 2016-03-05 | Discharge: 2016-03-05 | Disposition: A | Discharge status: Home / Self Care | Payer: Medicaid Other | Attending: Emergency Medicine | Admitting: Emergency Medicine

## 2016-03-05 ENCOUNTER — Encounter (HOSPITAL_BASED_OUTPATIENT_CLINIC_OR_DEPARTMENT_OTHER): Payer: Self-pay | Admitting: *Deleted

## 2016-03-05 DIAGNOSIS — B09 Unspecified viral infection characterized by skin and mucous membrane lesions: Secondary | ICD-10-CM | POA: Diagnosis not present

## 2016-03-05 DIAGNOSIS — F17210 Nicotine dependence, cigarettes, uncomplicated: Secondary | ICD-10-CM | POA: Insufficient documentation

## 2016-03-05 DIAGNOSIS — K120 Recurrent oral aphthae: Secondary | ICD-10-CM

## 2016-03-05 DIAGNOSIS — Z79899 Other long term (current) drug therapy: Secondary | ICD-10-CM | POA: Insufficient documentation

## 2016-03-05 DIAGNOSIS — J029 Acute pharyngitis, unspecified: Secondary | ICD-10-CM | POA: Diagnosis present

## 2016-03-05 HISTORY — DX: Bipolar disorder, unspecified: F31.9

## 2016-03-05 LAB — RAPID STREP SCREEN (MED CTR MEBANE ONLY): Streptococcus, Group A Screen (Direct): NEGATIVE

## 2016-03-05 MED ORDER — IBUPROFEN 800 MG PO TABS
800.0000 mg | ORAL_TABLET | Freq: Once | ORAL | Status: AC
  Administered 2016-03-05: 800 mg via ORAL
  Filled 2016-03-05: qty 1

## 2016-03-05 MED ORDER — IBUPROFEN 800 MG PO TABS: 800.0000 mg | ORAL_TABLET | Freq: Three times a day (TID) | ORAL | 0 refills | Status: DC

## 2016-03-05 MED ORDER — MAGIC MOUTHWASH W/LIDOCAINE: 5.0000 mL | Freq: Three times a day (TID) | ORAL | 0 refills | Status: DC | PRN

## 2016-03-05 MED ORDER — GI COCKTAIL ~~LOC~~
30.0000 mL | Freq: Once | ORAL | Status: AC
  Administered 2016-03-05: 30 mL via ORAL
  Filled 2016-03-05: qty 30

## 2016-03-05 MED FILL — MAGIC MOUTHWASH W/LIDO 1:1: 16 days supply | Qty: 240 | Fill #0

## 2016-03-05 MED FILL — IBUPROFEN 800 MG TABLET: 800 | 10 days supply | Qty: 30 | Fill #0

## 2016-03-05 NOTE — ED Provider Notes (Signed)
[facility]   CSN: [id number] Arrival date & time: 03/05/16  0800     History   Chief Complaint Chief Complaint  Patient presents with  . Sore Throat    HPI Amy Le is a 40 y.o. female hx of bipolar, here presenting with sore throat, subjective fevers, rash. Patient states that for the last 4 days she's been having sore throat. She also states that she's been having subjective fevers or myalgias. Denies any cough or vomiting or urinary symptoms. Patient also noticed that she's been having tender lymph nodes under the bilateral armpits as well as some rash on bilateral legs. Denies any recent travel or tick bites. She is not currently on any new medicines.   The history is provided by the patient.    Past Medical History:  Diagnosis Date  . Bipolar 1 disorder (HCC)   . BV (bacterial vaginosis)   . Chlamydia   . Trichomonas     Patient Active Problem List   Diagnosis Date Noted  . Cocaine use disorder, severe, dependence (HCC) 10/07/2015  . 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 FOOT SURGERY    . MULTIPLE TOOTH EXTRACTIONS Bilateral 2017  . TUBAL LIGATION      OB History    No data available       Home Medications    Prior to Admission medications   Medication Sig Start Date End Date Taking? Authorizing Provider  gabapentin (NEURONTIN) 300 MG capsule Take 2 capsules (600 mg total) by mouth 4 (four) times daily. For agitation 10/07/15  Yes Sanjuana KavaAgnes I Nwoko, NP  hydrOXYzine (ATARAX/VISTARIL) 25 MG tablet Take 1 tablet (25 mg total) by mouth every 6 (six) hours as needed for anxiety. 10/07/15  Yes Sanjuana KavaAgnes I Nwoko, NP  lurasidone (LATUDA) 40 MG TABS tablet Take 40 mg by mouth daily with breakfast.   Yes Historical Provider, MD  mirtazapine (REMERON) 15 MG tablet Take 1 tablet (15 mg total) by  mouth at bedtime. For depression/insomnia 10/07/15  Yes Sanjuana KavaAgnes I Nwoko, NP  acetaminophen (TYLENOL) 500 MG tablet Take 500 mg by mouth every 6 (six) hours as needed for mild pain or moderate pain.    Historical Provider, MD  hydrocerin (EUCERIN) CREA Apply 1 application topically 2 (two) times daily. For dry skin 10/07/15   Sanjuana KavaAgnes I Nwoko, NP  ibuprofen (ADVIL,MOTRIN) 200 MG tablet Take 800 mg by mouth every 6 (six) hours as needed (pain).    Historical Provider, MD  nicotine (NICODERM CQ - DOSED IN MG/24 HOURS) 21 mg/24hr patch Place 1 patch (21 mg total) onto the skin daily. For smoking cessation 10/07/15   Sanjuana KavaAgnes I Nwoko, NP  predniSONE (DELTASONE) 20 MG tablet Take 2 tablets (40 mg total) by mouth daily. 11/02/15   Barrett HenleNicole Elizabeth Nadeau, PA-C  QUEtiapine (SEROQUEL) 100 MG tablet Take 1 tablet (100 mg total) by mouth 2 (two) times daily with breakfast and lunch. For agitation 10/07/15   Sanjuana KavaAgnes I Nwoko, NP  QUEtiapine (SEROQUEL) 400 MG tablet Take 1 tablet (400 mg total) by mouth at bedtime. For mood control 10/07/15   Sanjuana KavaAgnes I Nwoko, NP  sertraline (ZOLOFT) 50 MG tablet Take 3 tablets (150 mg total) by mouth daily. For depression 10/07/15   Sanjuana KavaAgnes I Nwoko, NP    Family History No family history on file.  Social History Social History  Substance Use Topics  . Smoking status: Current Every Day Smoker    Packs/day: 0.50    Types: Cigarettes  . Smokeless tobacco: Never Used  . Alcohol use No     Allergies   Patient has no known allergies.   Review of Systems Review of Systems  HENT: Positive for sore throat.   Skin: Positive for rash.  All other systems reviewed and are negative.    Physical Exam Updated Vital Signs BP 141/83   Pulse 90   Temp 98.6 F (37 C) (Oral)   Resp 16   Ht 5\' 6"  (1.676 m)   Wt 240 lb (108.9 kg)   LMP 03/03/2016   SpO2 100%   BMI 38.74 kg/m   Physical Exam  Constitutional: She is oriented to person, place, and time. She appears well-nourished.  Slightly  uncomfortable   HENT:  Head: Normocephalic.  Right Ear: External ear normal.  Left Ear: External ear normal.  Aphthous ulcer R soft palate, no obvious tonsillar exudates. Uvula midline   Eyes: EOM are normal. Pupils are equal, round, and reactive to light.  Neck: Normal range of motion. Neck supple.  Cardiovascular: Normal rate, regular rhythm and normal heart sounds.   Pulmonary/Chest: Effort normal and breath sounds normal. No respiratory distress. She has no wheezes.  Abdominal: Soft. Bowel sounds are normal. She exhibits no distension. There is no tenderness. There is no guarding.  Musculoskeletal: Normal range of motion.  Neurological: She is alert and oriented to person, place, and time. No cranial nerve deficit. Coordination normal.  Skin: Skin is warm.  Mild erythema bilateral lower extremities, no obvious petechiae. No central clearance. There are tender lymph nodes under bilateral axilla, no obvious cellulitis or abscess   Psychiatric: She has a normal mood and affect.  Nursing note and vitals reviewed.    ED Treatments / Results  Labs (all labs ordered are listed, but only abnormal results are displayed) Labs Reviewed  [facility]APID STRE[facility])  CULTURE, GROUP A STREP [facility]    EKG  EKG Interpretation None       Radiology No results found.  Procedures Procedures (including critical care time)  Medications Ordered in ED Medications  ibuprofen (ADVIL,MOTRIN) tablet 800 mg (800 mg Oral Given 03/05/16 0836)  gi cocktail (Maalox,Lidocaine,Donnatal) (30 mLs Oral Given 03/05/16 0836)     Initial Impression / Assessment and Plan / ED Course  I have reviewed the triage vital signs and the nursing notes.  Pertinent labs & imaging results that were available during my care of the patient were reviewed by me and considered in my medical decision making (see chart for details).  Clinical Course     Amy Le is a 40 y.o. female here with sore  throat, myalgias, rash. Likely strep throat with scarlet fever vs viral exanthem. Has bilateral axillary tender lymph nodes but no obvious cellulitis or abscess there. Will check rapid strep.   9:05 AM Rapid strep neg. Felt better after GI cocktail, motrin. Will dc home with motrin, magic mouth wash for possible aphthous ulcers, viral exanthem.   Final Clinical Impressions(s) / ED Diagnoses   Final diagnoses:  None    New Prescriptions New Prescriptions   No medications on file     Charlynne Panderavid Hsienta Jenese Mischke, MD 03/05/16 [phone]

## 2016-03-05 NOTE — ED Triage Notes (Signed)
Pt reports sore throat and subjective fever starting 4 days ago. No fever x 2 days. Also rash to BLE and ?boils in both axilla

## 2016-03-05 NOTE — Discharge Instructions (Signed)
Stay hydrated.   Take tylenol, motrin for sore throat or fever,.   Use magic mouth wash to help you with the sore in your mouth.   See your doctor.   We sent off a culture from the throat swab. You will be called if the culture grew out bacteria  Return to ER if you have fever for a week, worse rash, dehydration, worse sore throat.

## 2016-03-07 LAB — CULTURE, GROUP A STREP (THRC)

## 2016-03-09 ENCOUNTER — Inpatient Hospital Stay (HOSPITAL_COMMUNITY)
Admission: AD | Admit: 2016-03-09 | Discharge: 2016-03-11 | DRG: 885 | Disposition: A | Discharge status: Home / Self Care | Payer: Medicaid Other | Source: Intra-hospital | Attending: Psychiatry | Admitting: Psychiatry

## 2016-03-09 ENCOUNTER — Emergency Department (HOSPITAL_COMMUNITY)
Admission: EM | Admit: 2016-03-09 | Discharge: 2016-03-09 | Disposition: A | Discharge status: Other Acute Inpatient Hospital | Payer: Medicaid Other | Attending: Emergency Medicine | Admitting: Emergency Medicine

## 2016-03-09 ENCOUNTER — Encounter (HOSPITAL_COMMUNITY): Payer: Self-pay | Admitting: *Deleted

## 2016-03-09 ENCOUNTER — Encounter (HOSPITAL_COMMUNITY): Payer: Self-pay | Admitting: Emergency Medicine

## 2016-03-09 DIAGNOSIS — Z915 Personal history of self-harm: Secondary | ICD-10-CM

## 2016-03-09 DIAGNOSIS — Z9889 Other specified postprocedural states: Secondary | ICD-10-CM | POA: Diagnosis not present

## 2016-03-09 DIAGNOSIS — F17210 Nicotine dependence, cigarettes, uncomplicated: Secondary | ICD-10-CM | POA: Diagnosis present

## 2016-03-09 DIAGNOSIS — R45851 Suicidal ideations: Secondary | ICD-10-CM | POA: Diagnosis present

## 2016-03-09 DIAGNOSIS — F14251 Cocaine dependence with cocaine-induced psychotic disorder with hallucinations: Secondary | ICD-10-CM | POA: Diagnosis present

## 2016-03-09 DIAGNOSIS — F314 Bipolar disorder, current episode depressed, severe, without psychotic features: Secondary | ICD-10-CM | POA: Diagnosis present

## 2016-03-09 DIAGNOSIS — F411 Generalized anxiety disorder: Secondary | ICD-10-CM | POA: Diagnosis present

## 2016-03-09 DIAGNOSIS — F1420 Cocaine dependence, uncomplicated: Secondary | ICD-10-CM

## 2016-03-09 DIAGNOSIS — F3132 Bipolar disorder, current episode depressed, moderate: Principal | ICD-10-CM | POA: Diagnosis present

## 2016-03-09 DIAGNOSIS — Z79899 Other long term (current) drug therapy: Secondary | ICD-10-CM

## 2016-03-09 LAB — I-STAT TROPONIN, ED: Troponin i, poc: 0 ng/mL (ref 0.00–0.08)

## 2016-03-09 LAB — CBC
HCT: 38.1 % (ref 36.0–46.0)
HEMOGLOBIN: 12.8 g/dL (ref 12.0–15.0)
MCH: 28.4 pg (ref 26.0–34.0)
MCHC: 33.6 g/dL (ref 30.0–36.0)
MCV: 84.5 fL (ref 78.0–100.0)
Platelets: 307 10*3/uL (ref 150–400)
RBC: 4.51 MIL/uL (ref 3.87–5.11)
RDW: 13.3 % (ref 11.5–15.5)
WBC: 4.1 10*3/uL (ref 4.0–10.5)

## 2016-03-09 LAB — COMPREHENSIVE METABOLIC PANEL
ALT: 22 U/L (ref 14–54)
AST: 35 U/L (ref 15–41)
Albumin: 3.8 g/dL (ref 3.5–5.0)
Alkaline Phosphatase: 89 U/L (ref 38–126)
Anion gap: 9 (ref 5–15)
BUN: 17 mg/dL (ref 6–20)
CHLORIDE: 106 mmol/L (ref 101–111)
CO2: 22 mmol/L (ref 22–32)
CREATININE: 0.86 mg/dL (ref 0.44–1.00)
Calcium: 8.8 mg/dL — ABNORMAL LOW (ref 8.9–10.3)
Glucose, Bld: 104 mg/dL — ABNORMAL HIGH (ref 65–99)
POTASSIUM: 3.6 mmol/L (ref 3.5–5.1)
Sodium: 137 mmol/L (ref 135–145)
Total Bilirubin: 0.6 mg/dL (ref 0.3–1.2)
Total Protein: 7.5 g/dL (ref 6.5–8.1)

## 2016-03-09 LAB — RAPID URINE DRUG SCREEN, HOSP PERFORMED
Amphetamines: NOT DETECTED
Barbiturates: POSITIVE — AB
Benzodiazepines: NOT DETECTED
COCAINE: POSITIVE — AB
OPIATES: NOT DETECTED
Tetrahydrocannabinol: NOT DETECTED

## 2016-03-09 LAB — ACETAMINOPHEN LEVEL: Acetaminophen (Tylenol), Serum: <10 ug/mL — ABNORMAL LOW (ref 10–30)

## 2016-03-09 LAB — SALICYLATE LEVEL

## 2016-03-09 LAB — ETHANOL

## 2016-03-09 MED ORDER — HYDROXYZINE HCL 25 MG PO TABS: 25.0000 mg | ORAL_TABLET | Freq: Four times a day (QID) | ORAL | Status: DC | PRN

## 2016-03-09 MED ORDER — ONDANSETRON HCL 4 MG PO TABS: 4.0000 mg | ORAL_TABLET | Freq: Three times a day (TID) | ORAL | Status: DC | PRN

## 2016-03-09 MED ORDER — INFLUENZA VAC SPLIT QUAD 0.5 ML IM SUSY
0.5000 mL | PREFILLED_SYRINGE | INTRAMUSCULAR | Status: AC
  Administered 2016-03-10: 0.5 mL via INTRAMUSCULAR
  Filled 2016-03-09: qty 0.5

## 2016-03-09 MED ORDER — QUETIAPINE FUMARATE 400 MG PO TABS
400.0000 mg | ORAL_TABLET | Freq: Every day | ORAL | Status: DC
  Filled 2016-03-09 (×2): qty 1

## 2016-03-09 MED ORDER — NICOTINE 21 MG/24HR TD PT24
21.0000 mg | MEDICATED_PATCH | Freq: Once | TRANSDERMAL | Status: DC
  Administered 2016-03-09: 21 mg via TRANSDERMAL
  Filled 2016-03-09: qty 1

## 2016-03-09 MED ORDER — QUETIAPINE FUMARATE 100 MG PO TABS
100.0000 mg | ORAL_TABLET | Freq: Two times a day (BID) | ORAL | Status: DC
  Administered 2016-03-09: 100 mg via ORAL
  Filled 2016-03-09 (×2): qty 1

## 2016-03-09 MED ORDER — LORAZEPAM 0.5 MG PO TABS
0.5000 mg | ORAL_TABLET | Freq: Four times a day (QID) | ORAL | Status: DC | PRN
  Administered 2016-03-09 – 2016-03-11 (×5): 0.5 mg via ORAL
  Filled 2016-03-09 (×5): qty 1

## 2016-03-09 MED ORDER — LURASIDONE HCL 40 MG PO TABS
40.0000 mg | ORAL_TABLET | Freq: Every day | ORAL | Status: DC
  Filled 2016-03-09 (×2): qty 1

## 2016-03-09 MED ORDER — PNEUMOCOCCAL VAC POLYVALENT 25 MCG/0.5ML IJ INJ
0.5000 mL | INJECTION | INTRAMUSCULAR | Status: AC
  Administered 2016-03-10: 0.5 mL via INTRAMUSCULAR

## 2016-03-09 MED ORDER — ALUM & MAG HYDROXIDE-SIMETH 200-200-20 MG/5ML PO SUSP: 30.0000 mL | ORAL | Status: DC | PRN

## 2016-03-09 MED ORDER — MAGNESIUM HYDROXIDE 400 MG/5ML PO SUSP: 30.0000 mL | Freq: Every day | ORAL | Status: DC | PRN

## 2016-03-09 MED ORDER — IBUPROFEN 600 MG PO TABS
600.0000 mg | ORAL_TABLET | Freq: Three times a day (TID) | ORAL | Status: DC | PRN
  Administered 2016-03-09: 600 mg via ORAL
  Filled 2016-03-09: qty 1

## 2016-03-09 MED ORDER — HYDROXYZINE HCL 25 MG PO TABS
25.0000 mg | ORAL_TABLET | Freq: Four times a day (QID) | ORAL | Status: DC | PRN
  Administered 2016-03-09: 25 mg via ORAL
  Filled 2016-03-09: qty 1
  Filled 2016-03-09: qty 10

## 2016-03-09 MED ORDER — LURASIDONE HCL 40 MG PO TABS
40.0000 mg | ORAL_TABLET | Freq: Every day | ORAL | Status: DC
  Administered 2016-03-10 – 2016-03-11 (×2): 40 mg via ORAL
  Filled 2016-03-09 (×2): qty 1
  Filled 2016-03-09: qty 7
  Filled 2016-03-09: qty 1

## 2016-03-09 MED ORDER — QUETIAPINE FUMARATE 100 MG PO TABS
100.0000 mg | ORAL_TABLET | Freq: Two times a day (BID) | ORAL | Status: DC
  Administered 2016-03-10: 100 mg via ORAL
  Filled 2016-03-09 (×3): qty 1

## 2016-03-09 MED ORDER — ACETAMINOPHEN 325 MG PO TABS
650.0000 mg | ORAL_TABLET | ORAL | Status: DC | PRN
  Administered 2016-03-09: 650 mg via ORAL
  Filled 2016-03-09: qty 2

## 2016-03-09 MED ORDER — QUETIAPINE FUMARATE 300 MG PO TABS: 400.0000 mg | ORAL_TABLET | Freq: Every day | ORAL | Status: DC

## 2016-03-09 MED ORDER — SERTRALINE HCL 100 MG PO TABS
150.0000 mg | ORAL_TABLET | Freq: Every day | ORAL | Status: DC
  Administered 2016-03-10 – 2016-03-11 (×2): 150 mg via ORAL
  Filled 2016-03-09 (×4): qty 1

## 2016-03-09 MED ORDER — ACETAMINOPHEN 325 MG PO TABS: 650.0000 mg | ORAL_TABLET | ORAL | Status: DC | PRN

## 2016-03-09 MED ORDER — SERTRALINE HCL 50 MG PO TABS
150.0000 mg | ORAL_TABLET | Freq: Every day | ORAL | Status: DC
  Administered 2016-03-09: 150 mg via ORAL
  Filled 2016-03-09: qty 3

## 2016-03-09 MED ORDER — ZOLPIDEM TARTRATE 5 MG PO TABS: 5.0000 mg | ORAL_TABLET | Freq: Every evening | ORAL | Status: DC | PRN

## 2016-03-09 MED ORDER — ZOLPIDEM TARTRATE 5 MG PO TABS
5.0000 mg | ORAL_TABLET | Freq: Every evening | ORAL | Status: DC | PRN
  Administered 2016-03-10: 5 mg via ORAL
  Filled 2016-03-09: qty 1

## 2016-03-09 MED ORDER — IBUPROFEN 200 MG PO TABS: 600.0000 mg | ORAL_TABLET | Freq: Three times a day (TID) | ORAL | Status: DC | PRN

## 2016-03-09 NOTE — Progress Notes (Signed)
D   Pt has isolated to her room this evening and has not come out for groups or medications   She contracts for safety and endorses depression and anxiety A    Verbal support given   Medications offered    Q 15 min checks R   Pt is presently safe

## 2016-03-09 NOTE — Progress Notes (Signed)
Patient signed 72 hr request for discharge on 03/10/2015 at 1426.    Patient came on the unit and then stated she needed to get more numbers out of her phone.  Nurse brought phone to patient on unit and phone will be returned to patient's locker.    Patient denied SI and HI, contracts for safety.  Denied A/V hallucinations.

## 2016-03-09 NOTE — Progress Notes (Signed)
Patient has been admitted to [address] hall.  Patient came onto [address], saw her roommate that she knew, and was laughing, joking  and talking to her.  Patient showered and came to med window.  Was given vistaril and ibuprofen, and patient stated she usually gets ativan when she comes in.  Patient stated she will talk to MD about ativan.  Nurse discussed with [facility] and has been seen laughing and talking to roommates and peers and to staff.  No signs/symptoms of pain/distress noted on patient's face/body movements.  Staff agrees that patient is extremely happy and feeling good during admission.  Has been seen laughing during admission and on the hallway.   Respirations even and unlabored.  No signs/symptoms of pain/distress noted on patient's face/body movements.  Safety maintained with 15 minute checks.
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
## 2016-03-09 NOTE — Progress Notes (Signed)
Patient did not attend the evening speaker AA meeting. Pt was notified that group was beginning but returned to her room.   

## 2016-03-09 NOTE — Progress Notes (Signed)
Recreation Therapy Notes    Animal-Assisted Activity (AAA) Program Checklist/Progress Notes Patient Eligibility Criteria Checklist & Daily Group note for Rec TxIntervention  Date: 12.26.2017 Time: 2:45pm [facility] [address]    AAA/T Program Assumption of Risk Form signed by Patient/ or Parent Legal Guardian Yes  Patient is free of allergies or sever asthma Yes  Patient reports no fear of animals Yes  Patient reports no history of cruelty to animals Yes  Patient understands his/her participation is voluntary Yes  Behavioral Response: Did not attend.   Marykay Lexenise L Alylah Blakney, LRT/CTRS  Drequan Ironside L 03/09/2016 3:04 PM

## 2016-03-09 NOTE — Progress Notes (Signed)
D: Patient denies SI/HI and A/V hallucinations at this time; patient reports that she has AH on and off and that sometimes they tell her to harm self or other; contract for safety;patient reports that she has not been taking medications for over week  A: Monitored q 15 minutes; patient encouraged to attend groups; patient educated about medications; patient given medications per physician orders; patient encouraged to express feelings and/or concerns  R: Patient is cooperative and pleasant during admission; patient had no other questions or concerns at this time; report passed to receiving nurse Drenda FreezeBeverly K. RN

## 2016-03-09 NOTE — ED Notes (Signed)
Bed: WA09 Expected date:  Expected time:  Means of arrival:  Comments: Triage SI

## 2016-03-09 NOTE — ED Notes (Signed)
Report given, patient to be transferred to 37.
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
## 2016-03-09 NOTE — BH Assessment (Signed)
[facility] Assessment Progress Note  Per Nanine MeansJamison Lord, DNP, this pt requires psychiatric hospitalization at this time.  Lillia AbedLindsay, RN, [address] [facility] R[facility]; they will be ready to receive pt at 12:30.  Pt has signed Voluntary Admission and Consent for Treatment, as well as Consent to Release Information to [facility], her outpatient provider, and a notification call has been placed.  Signed [facility].  Pt's nurse, Kendal Hymendie, has been notified, and agrees to send original paperwork along with pt via [place] [place], and to call report to [phone].  Doylene Canninghomas Taden Witter, MA Triage Specialist [phone]

## 2016-03-09 NOTE — ED Notes (Signed)
Pt discharged ambulatory with [place] driver.  All belongings were sent with patient.  Pt was in no distress and was calm and cooperative.

## 2016-03-09 NOTE — ED Triage Notes (Signed)
Pt c/o suicidal ideation with plan to cut self, had knife in hand this morning with intention to cut self, auditory hallucinations of voices telling pt to harm self today, voices told pt to harm others yesterday. No HI, visual hallucination. Recent loss to support system, emotionally stressful Christmas holiday, off of her psychiatric medications.

## 2016-03-09 NOTE — ED Notes (Signed)
Pt oriented to room and unit.  She is pleasant and cooperative.  She denies S/I and H/I but complains of audio hallucinations.

## 2016-03-09 NOTE — Tx Team (Addendum)
Initial Treatment Plan 03/09/2016 3:04 PM Amy RankinJacquetta Mires BMW:[id number]    PATIENT STRESSORS: Marital or family conflict Medication change or noncompliance Substance abuse   PATIENT STRENGTHS: Ability for insight Average or above average intelligence General fund of knowledge Physical Health   PATIENT IDENTIFIED PROBLEMS:   "ANXIETY"  "DEPRESSION"                 DISCHARGE CRITERIA:  Improved stabilization in mood, thinking, and/or behavior Need for constant or close observation no longer present  PRELIMINARY DISCHARGE PLAN: Attend aftercare/continuing care group Return to previous living arrangement  PATIENT/FAMILY INVOLVEMENT: This treatment plan has been presented to and reviewed with the patient, Amy Le.  The patient and family have been given the opportunity to ask questions and make suggestions.  Barton Fannyyson, Britney M, RN 03/09/2016, 3:04 PM

## 2016-03-09 NOTE — ED Provider Notes (Signed)
[facility]-EMERGENCY DEPT Provider Note   CSN: [id number] Arrival date & time: 03/09/16  0708     History   Chief Complaint Chief Complaint  Patient presents with  . Suicidal    HPI Amy Le is a 40 y.o. female.  HPI Patient states she has had "a bad Christmas" with her family. She reports she's feeling suicidal. She reports this morning she was close to cutting her wrists. She also reports she wanted to hurt the person in the house with her. She does not specify who this is. She reports first she wanted to try to make a dog bite him. The rest she did not want to talk about. She reports that she is hearing voices. She states she has not had that problem before. She reports that she occasionally uses cocaine. Last use about 5 days ago. She reports the use of about every several weeks. Denies other drug or alcohol use. She reports she has not been sick or ill recently, she did however stopped taking all her medications about 2 days ago. Past Medical History:  Diagnosis Date  . Bipolar 1 disorder (HCC)   . BV (bacterial vaginosis)   . Chlamydia   . Trichomonas     Patient Active Problem List   Diagnosis Date Noted  . Bipolar disorder, current episode depressed, moderate (HCC) 03/11/2016  . Cocaine use disorder, severe, dependence (HCC) 10/07/2015  . 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 FOOT SURGERY    . MULTIPLE TOOTH EXTRACTIONS Bilateral 2017  . TUBAL LIGATION      OB History    No data available       Home Medications    Prior to Admission medications   Medication Sig Start Date End Date Taking? Authorizing Provider  hydrOXYzine (ATARAX/VISTARIL) 25 MG tablet Take 1 tablet (25 mg total) by mouth every 6 (six) hours as needed for anxiety. 03/11/16   Beau FannyJohn C Withrow, FNP  ibuprofen (ADVIL,MOTRIN) 800 MG tablet Take 800 mg by mouth 2 (two) times daily as needed for pain. 03/05/16   Historical  Provider, MD  LORazepam (ATIVAN) 0.5 MG tablet Take 1 tablet (0.5 mg total) by mouth 2 (two) times daily as needed (anxiety). 03/11/16   Beau FannyJohn C Withrow, FNP  lurasidone (LATUDA) 40 MG TABS tablet Take 1 tablet (40 mg total) by mouth daily with breakfast. 03/12/16   Beau FannyJohn C Withrow, FNP  sertraline (ZOLOFT) 50 MG tablet Take 3 tablets (150 mg total) by mouth daily. 03/12/16   Beau FannyJohn C Withrow, FNP  zolpidem (AMBIEN) 5 MG tablet Take 1 tablet (5 mg total) by mouth at bedtime as needed for sleep. 03/11/16   Beau FannyJohn C Withrow, FNP    Family History No family history on file.  Social History Social History  Substance Use Topics  . Smoking status: Current Every Day Smoker    Packs/day: 0.50    Types: Cigarettes  . Smokeless tobacco: Never Used  . Alcohol use No     Allergies   Patient has no known allergies.   Review of Systems Review of Systems 10 Systems reviewed and are negative for acute change except as noted in the HPI.  Physical Exam Updated Vital Signs BP 117/73   Pulse 106   Temp 98.4 F (36.9 C)   Resp 18   LMP 03/03/2016   SpO2 100%   Physical Exam  Constitutional: She appears well-developed and well-nourished. No distress.  HENT:  Head: Normocephalic and atraumatic.  Eyes: Conjunctivae are normal.  Neck: Neck supple.  Cardiovascular: Normal rate and regular rhythm.   No murmur heard. Pulmonary/Chest: Effort normal and breath sounds normal. No respiratory distress.  Abdominal: Soft. There is no tenderness.  Musculoskeletal: She exhibits no edema.  Neurological: She is alert.  Skin: Skin is warm and dry.  Psychiatric: She has a normal mood and affect.  Nursing note and vitals reviewed.    ED Treatments / Results  Labs (all labs ordered are listed, but only abnormal results are displayed) Labs Reviewed  COMPREHENSIVE METABOLIC PANEL - Abnormal; Notable for the following:       Result Value   Glucose, Bld 104 (*)    Calcium 8.8 (*)    All other components  within normal limits  ACETAMINOPHEN LEVEL - Abnormal; Notable for the following:    Acetaminophen (Tylenol), Serum <10 (*)    All other components within normal limits  RAPID URINE DRUG SCREEN, HOSP PERFORMED - Abnormal; Notable for the following:    Cocaine POSITIVE (*)    Barbiturates POSITIVE (*)    All other components within normal limits  ETHANOL  SALICYLATE LEVEL  CBC  I-STAT BETA HCG BLOOD, ED ([facility], [facility], [facility] ONLY)  I-STAT TROPOININ, ED    EKG  EKG Interpretation None       Radiology No results found.  Procedures Procedures (including critical care time)  Medications Ordered in ED Medications - No data to display   Initial Impression / Assessment and Plan / ED Course  I have reviewed the triage vital signs and the nursing notes.  Pertinent labs & imaging results that were available during my care of the patient were reviewed by me and considered in my medical decision making (see chart for details).  Clinical Course     Final Clinical Impressions(s) / ED Diagnoses   Final diagnoses:  Cocaine use disorder, severe, dependence (HCC)  Bipolar affective disorder, depressed, severe (HCC)   Patient is medically clear for psychiatric evaluation. New Prescriptions Discharge Medication List as of 03/09/2016  1:34 PM       Arby BarretteMarcy Newel Oien, MD 03/19/16 [phone]

## 2016-03-10 DIAGNOSIS — F314 Bipolar disorder, current episode depressed, severe, without psychotic features: Secondary | ICD-10-CM

## 2016-03-10 DIAGNOSIS — Z79899 Other long term (current) drug therapy: Secondary | ICD-10-CM

## 2016-03-10 DIAGNOSIS — Z9889 Other specified postprocedural states: Secondary | ICD-10-CM

## 2016-03-10 DIAGNOSIS — F1420 Cocaine dependence, uncomplicated: Secondary | ICD-10-CM

## 2016-03-10 NOTE — Progress Notes (Signed)
Patient ID: [name], female   DOB: 1975-11-16, 40 y.o.   MRN: [id number]  Patient was again reminded to provide a urine specimen but has yet to do so. This Clinical research associatewriter reported this off to oncoming night shift RN Marchelle Folksmanda C.

## 2016-03-10 NOTE — [facility] Suicide Risk Assessment (Signed)
[facility] INPATIENT:  Family/Significant Other Suicide Prevention Education  Suicide Prevention Education:  Patient Refusal for Family/Significant Other Suicide Prevention Education: The patient Amy Le has refused to provide written consent for family/significant other to be provided Family/Significant Other Suicide Prevention Education during admission and/or prior to discharge.  Physician notified.  SPE completed with pt, as pt refused to consent to family contact. SPI pamphlet provided to pt and pt was encouraged to share information with support network, ask questions, and talk about any concerns relating to SPE. Pt denies access to guns/firearms and verbalized understanding of information provided. Mobile Crisis information also provided to pt.   Nereida Schepp N Smart LCSW 03/10/2016, 10:42 AM

## 2016-03-10 NOTE — [facility] Suicide Risk Assessment (Addendum)
[facility]   Nursing information obtained from:  Patient Demographic factors:  Divorced or widowed, Low socioeconomic status, Unemployed Current Mental Status:  Self-harm thoughts Loss Factors:  Loss of significant relationship Historical Factors:  NA Risk Reduction Factors:  Sense of responsibility to family  Total Time spent with patient: 45 minutes Principal Problem: Bipolar affective disorder, depressed, severe (HCC) Diagnosis:   Patient Active Problem List   Diagnosis Date Noted  . Bipolar affective disorder, depressed, severe (HCC) [F31.4] 03/09/2016  . Cocaine use disorder, severe, dependence (HCC) [F14.20] 10/07/2015  . 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  Patient is noncompliant with medication for past few days.  Patient has history of inpatient treatment due to suicidal thoughts and cocaine use.  Her last admission was July 2017.  Patient endorse poor sleep, irritability, severe mood swing, having suicidal thoughts and vague homicidal thoughts.  Patient requires psychiatric inpatient treatment for stabilization.  Continued Clinical Symptoms:  Alcohol Use Disorder Identification Test Final Score (AUDIT): 1 The "Alcohol Use Disorders Identification Test", Guidelines for Use in Primary Care, Second Edition.  World Science writerHealth Organization [facility](WHO). Score between 0-7:  no or low risk or alcohol related problems. Score between 8-15:  moderate risk of alcohol related problems. Score between 16-19:  high risk of alcohol related problems. Score 20 or above:  warrants further diagnostic evaluation for alcohol dependence and treatment.   CLINICAL FACTORS:   Bipolar  Disorder:   Mixed State Depression:   Hopelessness Impulsivity Insomnia Severe Alcohol/Substance Abuse/Dependencies More than one psychiatric diagnosis Unstable or Poor Therapeutic Relationship Previous Psychiatric Diagnoses and Treatments   Musculoskeletal: Strength & Muscle Tone: within normal limits Gait & Station: normal Patient leans: N/A  Psychiatric Specialty Exam: Physical Exam  ROS  Blood pressure (!) 149/89, pulse 93, temperature 98.9 F (37.2 C), resp. rate 16, height 5\' 6"  (1.676 m), weight 108.9 kg (240 lb), last menstrual period 03/03/2016, SpO2 100 %.Body mass index is 38.74 kg/m.  General Appearance: Fairly Groomed  Eye Contact:  Fair  Speech:  Slow  Volume:  Normal  Mood:  Depressed and Irritable  Affect:  Constricted and Depressed  Thought Process:  Goal Directed  Orientation:  Full (Time, Place, and Person)  Thought Content:  Rumination  Suicidal Thoughts:  Yes.  with intent/plan  Homicidal Thoughts:  Upon admission having homicidal thoughts but denies today  Memory:  Immediate;   Fair Recent;   Fair Remote;   Fair  Judgement:  Impaired  Insight:  Lacking  Psychomotor Activity:  Increased  Concentration:  Concentration: Fair and Attention Span: Fair  Recall:  [employer]  Fund of Knowledge:  Fair  Language:  Fair  Akathisia:  No  Handed:  Right  AIMS (if indicated):     Assets:  Communication Skills Desire for Improvement Housing  ADL's:  Intact  Cognition:  WNL  Sleep:  Number of Hours: 5.75      COGNITIVE FEATURES THAT CONTRIBUTE TO RISK:  Polarized thinking and Thought constriction (tunnel vision)    SUICIDE RISK:   Moderate:  Frequent suicidal ideation with limited intensity, and duration, some specificity in terms of plans, no associated intent, good self-control, limited dysphoria/symptomatology, some risk factors present, and identifiable protective factors, including available and accessible  social support.   PLAN OF CARE: Patient is  40 year old African-American unemployed female who relapsed into cocaine due to noncompliance with medication resulting severe depression and having suicidal thoughts to plan to cut her wrists.  Patient requires psychiatric inpatient treatment and stabilization.  We will resume her psychiatric medication and encouraged to participate in group milieu therapy.  She does not want Seroquel because she gained weight and like to continue Latuda with her other psychiatric medication.  Please see admission note for comprehensive treatment plan.  I certify that inpatient services furnished can reasonably be expected to improve the patient's condition.  Takiya Belmares T., MD 03/10/2016, 9:34 AM

## 2016-03-10 NOTE — Progress Notes (Signed)
Patient ID: [name], female   DOB: 26-Sep-1975, 40 y.o.   MRN: [id number]  DAR: Pt. Denies SI/HI and A/V Hallucinations. She reports she is able to contract for safety if feeling unsafe. She reports sleep is good, appetite is good, energy level is normal, and concentration is good. She rates depression 6/10, hopelessness 4/10, and anxiety 4/10. Patient does not report any pain or discomfort at this time. Support and encouragement provided to the patient. Scheduled medications administered to patient per physician's orders. Patient is pleasant during conversation with this Clinical research associatewriter. She reported anxiety this morning and received PRN Ativan which provided relief. Patient has been prompted a couple of times to return urine specimen cup with a specimen but has yet to do so. Writer will continue to prompt patient for this sample. Q15 minute checks are maintained for safety.

## 2016-03-10 NOTE — H&P (Signed)
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 Bipolar affective disorder, depressed, severe ([place]) [F31.4] 03/09/2016  . Cocaine use disorder, severe, dependence ([place]) [F14.20] 10/07/2015  . 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 female with history of Bipolar I Disorder. Patient presents to [facility] via [facility], escorted from her home. Patient reportedly suicidal.  She states that "I just get into phases.  I think the meds are working then I stop because I feel good and I think I don't need them,  then 5 days pass, then I get in trouble."   Patient states that she has good support from family, and tries her best to be a good mother, she has a 22 year son.    Patient developed onset of suicidal thoughts was yesterday. She has a plan to slit her wrist.  Patient has history of trying to harm herself 2x's in the past (overdose and hang self).  Patient has received INPT treatment at [facility] 09/2015 for suicidal thoughts.  Associated Signs/Symptoms: Depression Symptoms:  depressed mood, anxiety, disturbed sleep, (Hypo) Manic Symptoms:  Labiality of Mood, Anxiety Symptoms:  Excessive Worry, Psychotic Symptoms:  NA PTSD Symptoms: NA Total Time spent with patient: 45 minutes  Past Psychiatric History: see HPI  Is the patient at risk to self? Yes.    Has the patient been a risk to self in the past 6 months? Yes.    Has the patient been a risk to self within the distant past? Yes.    Is the patient a risk to  others? No.  Has the patient been a risk to others in the past 6 months? No.  Has the patient been a risk to others within the distant past? No.   Prior Inpatient Therapy:   Prior Outpatient Therapy:    Alcohol Screening: 1. How often do you have a drink containing alcohol?: Monthly or less 2. How many drinks containing alcohol do you have on a typical day when you are drinking?: 1 or 2 3. How often do you have six or more drinks on one occasion?: Never Preliminary Score: 0 9. Have you or someone else been injured as a result of your drinking?: No 10. Has a relative or friend or a doctor or another health worker been concerned about your drinking or suggested you cut down?: No Alcohol Use Disorder Identification Test Final Score (AUDIT): 1 Brief Intervention: Patient declined brief intervention Substance Abuse History in the last 12 months:  Yes.   Consequences of Substance Abuse: NA Previous Psychotropic Medications: Yes  Psychological Evaluations: Yes  Past Medical History:  Past Medical History:  Diagnosis Date  . Bipolar 1 disorder ([place])   . BV (bacterial vaginosis)   . Chlamydia   . Trichomonas     Past Surgical History:  Procedure Laterality Date  . FOOT SURGERY    . MULTIPLE TOOTH EXTRACTIONS Bilateral 2017  . TUBAL LIGATION     Family History: History reviewed. No pertinent family history. Family Psychiatric  History: see HPI Tobacco Screening: Have you used any form of tobacco in  the last 30 days? (Cigarettes, Smokeless Tobacco, Cigars, and/or Pipes): Yes Tobacco use, Select all that apply: 5 or more cigarettes per day Are you interested in Tobacco Cessation Medications?: Yes, will notify MD for an order Counseled patient on smoking cessation including recognizing danger situations, developing coping skills and basic information about quitting provided: Refused/Declined practical counseling Social History:  History  Alcohol Use No     History  Drug Use No     Additional Social History:    Allergies:  No Known Allergies Lab Results:  Results for orders placed or performed during the hospital encounter of 03/09/16 (from the past 48 hour(s))  Rapid urine drug screen (hospital performed)     Status: Abnormal   Collection Time: 03/09/16  7:24 AM  Result Value Ref Range   Opiates NONE DETECTED NONE DETECTED   Cocaine POSITIVE (A) NONE DETECTED   Benzodiazepines NONE DETECTED NONE DETECTED   Amphetamines NONE DETECTED NONE DETECTED   Tetrahydrocannabinol NONE DETECTED NONE DETECTED   Barbiturates POSITIVE (A) NONE DETECTED    Comment:        DRUG SCREEN FOR MEDICAL PURPOSES ONLY.  IF CONFIRMATION IS NEEDED FOR ANY PURPOSE, NOTIFY LAB WITHIN 5 DAYS.        LOWEST DETECTABLE LIMITS FOR URINE DRUG SCREEN Drug Class       Cutoff (ng/mL) Amphetamine      1000 Barbiturate      200 Benzodiazepine   540 Tricyclics       086 Opiates          300 Cocaine          300 THC              50   Comprehensive metabolic panel     Status: Abnormal   Collection Time: 03/09/16  7:43 AM  Result Value Ref Range   Sodium 137 135 - 145 mmol/L   Potassium 3.6 3.5 - 5.1 mmol/L   Chloride 106 101 - 111 mmol/L   CO2 22 22 - 32 mmol/L   Glucose, Bld 104 (H) 65 - 99 mg/dL   BUN 17 6 - 20 mg/dL   Creatinine, Ser 0.86 0.44 - 1.00 mg/dL   Calcium 8.8 (L) 8.9 - 10.3 mg/dL   Total Protein 7.5 6.5 - 8.1 g/dL   Albumin 3.8 3.5 - 5.0 g/dL   AST 35 15 - 41 U/L   ALT 22 14 - 54 U/L   Alkaline Phosphatase 89 38 - 126 U/L   Total Bilirubin 0.6 0.3 - 1.2 mg/dL   GFR calc non Af Amer >60 >60 mL/min   GFR calc Af Amer >60 >60 mL/min    Comment: (NOTE) The eGFR has been calculated using the CKD EPI equation. This calculation has not been validated in all clinical situations. eGFR's persistently <60 mL/min signify possible Chronic Kidney Disease.    Anion gap 9 5 - 15  Ethanol     Status: None   Collection Time: 03/09/16  7:43 AM  Result Value Ref Range    Alcohol, Ethyl (B) <5 <5 mg/dL    Comment:        LOWEST DETECTABLE LIMIT FOR SERUM ALCOHOL IS 5 mg/dL FOR MEDICAL PURPOSES ONLY   Salicylate level     Status: None   Collection Time: 03/09/16  7:43 AM  Result Value Ref Range   Salicylate Lvl <7.6 2.8 - 30.0 mg/dL  Acetaminophen level     Status: Abnormal   Collection Time: 03/09/16  7:43 AM  Result Value Ref Range   Acetaminophen (Tylenol), Serum <10 (L) 10 - 30 ug/mL    Comment:        THERAPEUTIC CONCENTRATIONS VARY SIGNIFICANTLY. A RANGE OF 10-30 ug/mL MAY BE AN EFFECTIVE CONCENTRATION FOR MANY PATIENTS. HOWEVER, SOME ARE BEST TREATED AT CONCENTRATIONS OUTSIDE THIS RANGE. ACETAMINOPHEN CONCENTRATIONS >150 ug/mL AT 4 HOURS AFTER INGESTION AND >50 ug/mL AT 12 HOURS AFTER INGESTION ARE OFTEN ASSOCIATED WITH TOXIC REACTIONS.   cbc     Status: None   Collection Time: 03/09/16  7:43 AM  Result Value Ref Range   WBC 4.1 4.0 - 10.5 K/uL   RBC 4.51 3.87 - 5.11 MIL/uL   Hemoglobin 12.8 12.0 - 15.0 g/dL   HCT 38.1 36.0 - 46.0 %   MCV 84.5 78.0 - 100.0 fL   MCH 28.4 26.0 - 34.0 pg   MCHC 33.6 30.0 - 36.0 g/dL   RDW 13.3 11.5 - 15.5 %   Platelets 307 150 - 400 K/uL  I-stat troponin, ED     Status: None   Collection Time: 03/09/16  7:57 AM  Result Value Ref Range   Troponin i, poc 0.00 0.00 - 0.08 ng/mL   Comment 3            Comment: Due to the release kinetics of cTnI, a negative result within the first hours of the onset of symptoms does not rule out myocardial infarction with certainty. If myocardial infarction is still suspected, repeat the test at appropriate intervals.     Blood Alcohol level:  Lab Results  Component Value Date   ETH <5 03/09/2016   ETH <5 76/54/6503    Metabolic Disorder Labs:  No results found for: HGBA1C, MPG No results found for: PROLACTIN No results found for: CHOL, TRIG, HDL, CHOLHDL, VLDL, LDLCALC  Current Medications: Current Facility-Administered Medications  Medication Dose  Route Frequency Provider Last Rate Last Dose  . acetaminophen (TYLENOL) tablet 650 mg  650 mg Oral Q4H PRN Patrecia Pour, NP      . alum & mag hydroxide-simeth (MAALOX/MYLANTA) 200-200-20 MG/5ML suspension 30 mL  30 mL Oral PRN Patrecia Pour, NP      . hydrOXYzine (ATARAX/VISTARIL) tablet 25 mg  25 mg Oral Q6H PRN Patrecia Pour, NP   25 mg at 03/09/16 1632  . ibuprofen (ADVIL,MOTRIN) tablet 600 mg  600 mg Oral Q8H PRN Patrecia Pour, NP   600 mg at 03/09/16 1632  . LORazepam (ATIVAN) tablet 0.5 mg  0.5 mg Oral Q6H PRN Jenne Campus, MD   0.5 mg at 03/10/16 1008  . lurasidone (LATUDA) tablet 40 mg  40 mg Oral Q breakfast Patrecia Pour, NP   40 mg at 03/10/16 0936  . magnesium hydroxide (MILK OF MAGNESIA) suspension 30 mL  30 mL Oral Daily PRN Patrecia Pour, NP      . ondansetron [facility]) tablet 4 mg  4 mg Oral Q8H PRN Patrecia Pour, NP      . sertraline (ZOLOFT) tablet 150 mg  150 mg Oral Daily Patrecia Pour, NP   150 mg at 03/10/16 0935  . zolpidem (AMBIEN) tablet 5 mg  5 mg Oral QHS PRN Patrecia Pour, NP       PTA Medications: Prescriptions Prior to Admission  Medication Sig Dispense Refill Last Dose  . gabapentin (NEURONTIN) 300 MG capsule Take 2 capsules (600 mg total) by mouth 4 (four) times daily. For agitation 240 capsule 0  Past Week at Unknown time  . hydrocerin (EUCERIN) CREA Apply 1 application topically 2 (two) times daily. For dry skin  0 03/08/2016 at Unknown time  . hydrOXYzine (ATARAX/VISTARIL) 25 MG tablet Take 1 tablet (25 mg total) by mouth every 6 (six) hours as needed for anxiety. 60 tablet 0 Past Week at Unknown time  . ibuprofen (ADVIL,MOTRIN) 800 MG tablet Take 1 tablet (800 mg total) by mouth 3 (three) times daily. 30 tablet 0 03/08/2016 at Unknown time  . lurasidone (LATUDA) 40 MG TABS tablet Take 40 mg by mouth daily with breakfast.   Past Week at Unknown time  . magic mouthwash w/lidocaine SOLN Take 5 mLs by mouth 3 (three) times daily as needed for mouth  pain. 30 mL 0 Past Week at Unknown time  . mirtazapine (REMERON) 15 MG tablet Take 1 tablet (15 mg total) by mouth at bedtime. For depression/insomnia 30 tablet 0 Past Week at Unknown time  . nicotine (NICODERM CQ - DOSED IN MG/24 HOURS) 21 mg/24hr patch Place 1 patch (21 mg total) onto the skin daily. For smoking cessation (Patient not taking: Reported on 03/09/2016) 28 patch 0 Not Taking at Unknown time  . predniSONE (DELTASONE) 20 MG tablet Take 2 tablets (40 mg total) by mouth daily. (Patient not taking: Reported on 03/09/2016) 10 tablet 0 Not Taking at Unknown time  . QUEtiapine (SEROQUEL) 100 MG tablet Take 1 tablet (100 mg total) by mouth 2 (two) times daily with breakfast and lunch. For agitation (Patient not taking: Reported on 03/09/2016) 60 tablet 0 Not Taking at Unknown time  . QUEtiapine (SEROQUEL) 400 MG tablet Take 1 tablet (400 mg total) by mouth at bedtime. For mood control (Patient not taking: Reported on 03/09/2016) 30 tablet 0 Not Taking at Unknown time  . sertraline (ZOLOFT) 50 MG tablet Take 3 tablets (150 mg total) by mouth daily. For depression 90 tablet 0 Past Week at Unknown time   Musculoskeletal: Strength & Muscle Tone: within normal limits Gait & Station: normal Patient leans: N/A  Psychiatric Specialty Exam: Physical Exam  Nursing note and vitals reviewed.   ROS  Blood pressure (!) 149/89, pulse 93, temperature 98.9 F (37.2 C), resp. rate 16, height _0  (1.676 m), weight 108.9 kg (240 lb), last menstrual period 03/03/2016, SpO2 100 %.Body mass index is 38.74 kg/m.  General Appearance: Fairly Groomed  Eye Contact:  Fair  Speech:  Slow  Volume:  Normal  Mood:  Anxious and Depressed  Affect:  Constricted and Depressed  Thought Process:  Goal Directed  Orientation:  Full (Time, Place, and Person)  Thought Content:  Rumination  Suicidal Thoughts:  Denies  Homicidal Thoughts:  No  Memory:  Immediate;   Fair Recent;   Fair Remote;   Fair  Judgement:   Impaired  Insight:  Fair  Psychomotor Activity:  EPS  Concentration:  Concentration: Fair and Attention Span: Fair  Recall:  [employer] of Knowledge:  Fair  Language:  Fair  Akathisia:  Negative  Handed:  Right  AIMS (if indicated):     Assets:  Desire for Improvement Resilience  ADL's:  Intact  Cognition:  WNL  Sleep:  Number of Hours: 5.75   Treatment Plan Summary: Admit for crisis management and mood stabilization. Medication management to re-stabilize current mood symptoms Group counseling sessions for coping skills Medical consults as needed Review and reinstate any pertinent home medications for other health problems  Observation Level/Precautions:  15 minute checks  Laboratory:  per  ED  Psychotherapy:  group  Medications:  Latuda 40 mg daily mood stabilization, Sertraline 150 mg daily depression, Ambien 5 mg insomnia  Consultations:  As needed    Discharge Concerns:  safety  Estimated LOS:  2-7 days  Other:     Physician Treatment Plan for Primary Diagnosis: Bipolar affective disorder, depressed, severe ([place]) Long Term Goal(s): Improvement in symptoms so as ready for discharge  Short Term Goals: Ability to identify changes in lifestyle to reduce recurrence of condition will improve, Ability to verbalize feelings will improve, Ability to disclose and discuss suicidal ideas, Ability to demonstrate self-control will improve, Ability to identify and develop effective coping behaviors will improve, Ability to maintain clinical measurements within normal limits will improve, Compliance with prescribed medications will improve and Ability to identify triggers associated with substance abuse/mental health issues will improve  Physician Treatment Plan for Secondary Diagnosis: Principal Problem:   Bipolar affective disorder, depressed, severe ([place])  Long Term Goal(s): Improvement in symptoms so as ready for discharge  Short Term Goals: Ability to identify changes in lifestyle  to reduce recurrence of condition will improve, Ability to verbalize feelings will improve, Ability to disclose and discuss suicidal ideas, Ability to demonstrate self-control will improve, Ability to identify and develop effective coping behaviors will improve, Ability to maintain clinical measurements within normal limits will improve, Compliance with prescribed medications will improve and Ability to identify triggers associated with substance abuse/mental health issues will improve  I certify that inpatient services furnished can reasonably be expected to improve the patient's condition.    Janett Labella, NP [facility] 12/27/201711:28 AM   Patient seen face to face for psychiatric evaluation. Chart reviewed and finding discussed with Physician extender. Agreed with disposition and treatment plan.   Berniece Andreas, MD

## 2016-03-10 NOTE — Progress Notes (Signed)
Recreation Therapy Notes  Date: 03/10/16 Time: 0930 Location: [address] Dayroom  Group Topic: Stress Management  Goal Area(s) Addresses:  Patient will verbalize importance of using healthy stress management.  Patient will identify positive emotions associated with healthy stress management.   Intervention: Calm App  Activity :  Resilience Meditation.  LRT introduced the stress management technique of meditation.  LRT played the meditation from the calm app to engage patients in the activity.  Patients were to follow along as the meditation played to engage in the process.  Education:  Stress Management, Discharge Planning.   Education Outcome: Acknowledges edcuation/In group clarification offered/Needs additional education  Clinical Observations/Feedback: Pt did not attend  group.   Caroll RancherMarjette Dorrell [place], LRT/CTRS         Caroll RancherLindsay, Bobbe Quilter A 03/10/2016 12:23 PM

## 2016-03-10 NOTE — [facility] Group Notes (Signed)
[facility] LCSW Group Therapy  03/10/2016 4:22 PM  Type of Therapy:  Group Therapy  Participation Level:  Did Not Attend-pt invited. Sleeping in room. Did not attend.   Summary of Progress/Problems: Today's Topic: Overcoming Obstacles. Patients identified one short term goal and potential obstacles in reaching this goal. Patients processed barriers involved in overcoming these obstacles. Patients identified steps necessary for overcoming these obstacles and explored motivation (internal and external) for facing these difficulties head on.   Nimisha Rathel N Smart LCSW 03/10/2016, 4:22 PM

## 2016-03-10 NOTE — Tx Team (Signed)
Interdisciplinary Treatment and Diagnostic Plan Update  03/10/2016 Time of Session: 9:30AM Amy RankinJacquetta Le MRN: [id number]  Principal Diagnosis: Bipolar affective disorder, depressed, severe (HCC)  Secondary Diagnoses: Principal Problem:   Bipolar affective disorder, depressed, severe (HCC)   Current Medications:  Current Facility-Administered Medications  Medication Dose Route Frequency Provider Last Rate Last Dose  . acetaminophen (TYLENOL) tablet 650 mg  650 mg Oral Q4H PRN Charm RingsJamison Y Lord, NP      . alum & mag hydroxide-simeth (MAALOX/MYLANTA) 200-200-20 MG/5ML suspension 30 mL  30 mL Oral PRN Charm RingsJamison Y Lord, NP      . hydrOXYzine (ATARAX/VISTARIL) tablet 25 mg  25 mg Oral Q6H PRN Charm RingsJamison Y Lord, NP   25 mg at 03/09/16 1632  . ibuprofen (ADVIL,MOTRIN) tablet 600 mg  600 mg Oral Q8H PRN Charm RingsJamison Y Lord, NP   600 mg at 03/09/16 1632  . LORazepam (ATIVAN) tablet 0.5 mg  0.5 mg Oral Q6H PRN Craige CottaFernando A Cobos, MD   0.5 mg at 03/10/16 1008  . lurasidone (LATUDA) tablet 40 mg  40 mg Oral Q breakfast Charm RingsJamison Y Lord, NP   40 mg at 03/10/16 0936  . magnesium hydroxide (MILK OF MAGNESIA) suspension 30 mL  30 mL Oral Daily PRN Charm RingsJamison Y Lord, NP      . ondansetron [facility](ZOFRAN) tablet 4 mg  4 mg Oral Q8H PRN Charm RingsJamison Y Lord, NP      . sertraline (ZOLOFT) tablet 150 mg  150 mg Oral Daily Charm RingsJamison Y Lord, NP   150 mg at 03/10/16 0935  . zolpidem (AMBIEN) tablet 5 mg  5 mg Oral QHS PRN Charm RingsJamison Y Lord, NP       PTA Medications: Prescriptions Prior to Admission  Medication Sig Dispense Refill Last Dose  . gabapentin (NEURONTIN) 300 MG capsule Take 2 capsules (600 mg total) by mouth 4 (four) times daily. For agitation 240 capsule 0 Past Week at Unknown time  . hydrocerin (EUCERIN) CREA Apply 1 application topically 2 (two) times daily. For dry skin  0 03/08/2016 at Unknown time  . hydrOXYzine (ATARAX/VISTARIL) 25 MG tablet Take 1 tablet (25 mg total) by mouth every 6 (six) hours as needed for anxiety. 60  tablet 0 Past Week at Unknown time  . ibuprofen (ADVIL,MOTRIN) 800 MG tablet Take 1 tablet (800 mg total) by mouth 3 (three) times daily. 30 tablet 0 03/08/2016 at Unknown time  . lurasidone (LATUDA) 40 MG TABS tablet Take 40 mg by mouth daily with breakfast.   Past Week at Unknown time  . magic mouthwash w/lidocaine SOLN Take 5 mLs by mouth 3 (three) times daily as needed for mouth pain. 30 mL 0 Past Week at Unknown time  . mirtazapine (REMERON) 15 MG tablet Take 1 tablet (15 mg total) by mouth at bedtime. For depression/insomnia 30 tablet 0 Past Week at Unknown time  . nicotine (NICODERM CQ - DOSED IN MG/24 HOURS) 21 mg/24hr patch Place 1 patch (21 mg total) onto the skin daily. For smoking cessation (Patient not taking: Reported on 03/09/2016) 28 patch 0 Not Taking at Unknown time  . predniSONE (DELTASONE) 20 MG tablet Take 2 tablets (40 mg total) by mouth daily. (Patient not taking: Reported on 03/09/2016) 10 tablet 0 Not Taking at Unknown time  . QUEtiapine (SEROQUEL) 100 MG tablet Take 1 tablet (100 mg total) by mouth 2 (two) times daily with breakfast and lunch. For agitation (Patient not taking: Reported on 03/09/2016) 60 tablet 0 Not Taking at Unknown time  .  QUEtiapine (SEROQUEL) 400 MG tablet Take 1 tablet (400 mg total) by mouth at bedtime. For mood control (Patient not taking: Reported on 03/09/2016) 30 tablet 0 Not Taking at Unknown time  . sertraline (ZOLOFT) 50 MG tablet Take 3 tablets (150 mg total) by mouth daily. For depression 90 tablet 0 Past Week at Unknown time    Patient Stressors: Marital or family conflict Medication change or noncompliance Substance abuse  Patient Strengths: Ability for insight Average or above average intelligence General fund of knowledge Physical Health  Treatment Modalities: Medication Management, Group therapy, Case management,  1 to 1 session with clinician, Psychoeducation, Recreational therapy.   Physician Treatment Plan for Primary  Diagnosis: Bipolar affective disorder, depressed, severe (HCC) Long Term Goal(s):     Short Term Goals:    Medication Management: Evaluate patient's response, side effects, and tolerance of medication regimen.  Therapeutic Interventions: 1 to 1 sessions, Unit Group sessions and Medication administration.  Evaluation of Outcomes: Progressing  Physician Treatment Plan for Secondary Diagnosis: Principal Problem:   Bipolar affective disorder, depressed, severe (HCC)  Long Term Goal(s):     Short Term Goals:       Medication Management: Evaluate patient's response, side effects, and tolerance of medication regimen.  Therapeutic Interventions: 1 to 1 sessions, Unit Group sessions and Medication administration.  Evaluation of Outcomes: Progressing   RN Treatment Plan for Primary Diagnosis: Bipolar affective disorder, depressed, severe (HCC) Long Term Goal(s): Knowledge of disease and therapeutic regimen to maintain health will improve  Short Term Goals: Ability to remain free from injury will improve, Ability to disclose and discuss suicidal ideas and Ability to identify and develop effective coping behaviors will improve  Medication Management: RN will administer medications as ordered by provider, will assess and evaluate patient's response and provide education to patient for prescribed medication. RN will report any adverse and/or side effects to prescribing provider.  Therapeutic Interventions: 1 on 1 counseling sessions, Psychoeducation, Medication administration, Evaluate responses to treatment, Monitor vital signs and CBGs as ordered, Perform/monitor CIWA, COWS, AIMS and Fall Risk screenings as ordered, Perform wound care treatments as ordered.  Evaluation of Outcomes: Progressing   LCSW Treatment Plan for Primary Diagnosis: Bipolar affective disorder, depressed, severe (HCC) Long Term Goal(s): Safe transition to appropriate next level of care at discharge, Engage patient in  therapeutic group addressing interpersonal concerns.  Short Term Goals: Engage patient in aftercare planning with referrals and resources, Facilitate patient progression through stages of change regarding substance use diagnoses and concerns and Identify triggers associated with mental health/substance abuse issues  Therapeutic Interventions: Assess for all discharge needs, 1 to 1 time with Social worker, Explore available resources and support systems, Assess for adequacy in community support network, Educate family and significant other(s) on suicide prevention, Complete Psychosocial Assessment, Interpersonal group therapy.  Evaluation of Outcomes: Progressing   Progress in Treatment: Attending groups: Yes. Participating in groups: Yes. Taking medication as prescribed: Yes. Toleration medication: Yes. Family/Significant other contact made: No, will contact:  family member if patient consents Patient understands diagnosis: Yes. Discussing patient identified problems/goals with staff: Yes. Medical problems stabilized or resolved: Yes. Denies suicidal/homicidal ideation: Yes. Issues/concerns per patient self-inventory: No. Other: n/a  New problem(s) identified: No, Describe:  n/a  New Short Term/Long Term Goal(s): medication stabilization; development of comprehensive mental wellness/sobriety plan; detox.   Discharge Plan or Barriers: CSW assessing for appropriate referrals. Patient's last admission was July 2017 and she was [facility] to [facility] with [facility]  as her outpatient provider.   Reason for Continuation of Hospitalization: Anxiety Depression Medication stabilization Withdrawal symptoms  Estimated Length of Stay: 2-4 days   Attendees: Patient: 03/10/2016 10:42 AM  Physician: Dr. Vanetta ShawlHisada MD 03/10/2016 10:42 AM  Nursing: Foy Guadalajarahrista; Meriam SpragueBeverly RN 03/10/2016 10:42 AM  RN Care Manager: 03/10/2016 10:42 AM  Social Worker: Trula SladeHeather Smart, LCSW; Vernie ShanksLauren  Newton LCSW 03/10/2016 10:42 AM  Recreational Therapist:  03/10/2016 10:42 AM  Other: Armandina StammerAgnes Nwoko NP; Claudette Headonrad Withrow NP 03/10/2016 10:42 AM  Other:  03/10/2016 10:42 AM  Other: 03/10/2016 10:42 AM    Scribe for Treatment Team: Ledell PeoplesHeather N Smart, LCSW 03/10/2016 10:42 AM

## 2016-03-10 NOTE — Progress Notes (Signed)
Reminded patient to return urine sample to nurse asap. Patient states "I just went to the bathroom and I forgot to use it". Patient instructed to keep urine cup in bathroom and again instructed to return asap. Patient given cup of ginger ale and encouraged to drink fluids.

## 2016-03-10 NOTE — Progress Notes (Signed)
CSW [facility] Brenton [facility]hell[facility] per patient request in attempt to resume services and schedule hospital visit.   Trula SladeHeather Smart, MSW, LCSW Clinical Social Worker 03/10/2016 10:46 AM

## 2016-03-10 NOTE — Progress Notes (Signed)
Patient did not attend AA group meeting. 

## 2016-03-10 NOTE — Progress Notes (Signed)
Patient ID: [name], female   DOB: [birth year], 40 y.o.   MRN: [id number]  Writer approached patient this morning as she was laying in bed. She reported feeling "good" at the moment. She inquired, "will I see a doctor today?" Writer informed patient that she will see a provider today. She currently denies SI/HI and A/V hallucinations. Writer provided patient with a urine specimen cup and informed patient that a specimen was needed for collection. Patient reports her last menstrual period ended three days ago and she denies any chance of being pregnant. Patient was informed that her medications were ready but has yet to get out of bed to receive them.

## 2016-03-11 DIAGNOSIS — F17210 Nicotine dependence, cigarettes, uncomplicated: Secondary | ICD-10-CM

## 2016-03-11 DIAGNOSIS — F3132 Bipolar disorder, current episode depressed, moderate: Secondary | ICD-10-CM | POA: Diagnosis present

## 2016-03-11 MED ORDER — HYDROXYZINE HCL 25 MG PO TABS: 25.0000 mg | ORAL_TABLET | Freq: Four times a day (QID) | ORAL | 0 refills | Status: DC | PRN

## 2016-03-11 MED ORDER — SERTRALINE HCL 50 MG PO TABS
150.0000 mg | ORAL_TABLET | Freq: Every day | ORAL | Status: DC
  Filled 2016-03-11 (×2): qty 3

## 2016-03-11 MED ORDER — SERTRALINE HCL 50 MG PO TABS: 150.0000 mg | ORAL_TABLET | Freq: Every day | ORAL | 0 refills | Status: DC

## 2016-03-11 MED ORDER — ZOLPIDEM TARTRATE 5 MG PO TABS: 5.0000 mg | ORAL_TABLET | Freq: Every evening | ORAL | 0 refills | Status: DC | PRN

## 2016-03-11 MED ORDER — LORAZEPAM 0.5 MG PO TABS: 0.5000 mg | ORAL_TABLET | Freq: Two times a day (BID) | ORAL | 0 refills | Status: DC | PRN

## 2016-03-11 MED ORDER — LURASIDONE HCL 40 MG PO TABS: 40.0000 mg | ORAL_TABLET | Freq: Every day | ORAL | 0 refills | Status: DC

## 2016-03-11 NOTE — [facility] Suicide Risk Assessment (Signed)
[facility]   Principal Problem: Bipolar disorder, current episode depressed, moderate (HCC) Discharge Diagnoses:  Patient Active Problem List   Diagnosis Date Noted  . Bipolar disorder, current episode depressed, moderate (HCC) [F31.32] 03/11/2016  . Cocaine use disorder, severe, dependence (HCC) [F14.20] 10/07/2015  . 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  All other systems reviewed and are negative.   Blood pressure 125/85, pulse 86, temperature 98.2 F (36.8 C), temperature source Oral, resp. rate 18, height 5\' 6"  (1.676 m), weight 108.9 kg (240 lb), last menstrual period 03/03/2016, SpO2 100 %.Body mass index is 38.74 kg/m.  General Appearance: Casual  Eye Contact::  Fair  Speech:  Clear and Coherent409  Volume:  Normal  Mood:  Euthymic  Affect:  Appropriate  Thought Process:  Goal Directed and Descriptions of Associations: Intact  Orientation:  Full (Time, Place, and Person)  Thought Content:  Logical  Suicidal Thoughts:  No  Homicidal Thoughts:  No  Memory:  Immediate;   Fair Recent;   Fair Remote;   Fair  Judgement:  Fair  Insight:  Fair  Psychomotor Activity:  Normal  Concentration:  Fair  Recall:  Fis[employer]Fair  Fund of Knowledge:Fair  Language: Fair  Akathisia:  No  Handed:  Right  AIMS (if indicated):     Assets:  Desire for Improvement  Sleep:  Number of Hours: 6.5  Cognition: WNL  ADL's:  Intact   Mental Status Per Nursing Assessment::   On Admission:  Self-harm thoughts  Demographic Factors:  NA  Loss Factors: NA  Historical Factors: Impulsivity  Risk  Reduction Factors:   Positive therapeutic relationship  Continued Clinical Symptoms:  Alcohol/Substance Abuse/Dependencies Previous Psychiatric Diagnoses and Treatments  Cognitive Features That Contribute To Risk:  None    Suicide Risk:  Minimal: No identifiable suicidal ideation.  Patients presenting with no risk factors but with morbid ruminations; may be classified as minimal risk based on the severity of the depressive symptoms  Follow-up Information    [facility]   Specialty[facility]  [facility] Contact information: [address] [place] [address] [phone]           Plan Of Care/Follow-up recommendations:  Activity:  no restrictions Diet:  regular Tests:  as needed Other:  follow up with aftercare  Wilmary Levit, MD 03/11/2016, 10:03 AM

## 2016-03-11 NOTE — Progress Notes (Signed)
Patient would not get the urine; patient stated " I keep forgetting"

## 2016-03-11 NOTE — Progress Notes (Signed)
Patient discharged per physician order; patient denies SI/HI and A/V hallucinations; patient received prescriptions, samples, AVS, suicide risk assessment note, and transition record given to the patient after it was reviewed; patient had no other questions or concerns at this time; patient verbalized and signed that all belongings were returned; patient left the unit ambulatory 

## 2016-03-11 NOTE — Progress Notes (Signed)
  [facility] Discharge [facility] :  Will you be returning to the same living situation after discharge:  Yes,  home At discharge, do you have transportation home?: Yes,  [facility] Do you have the ability to pay for your medications: Yes,  [facility]  Release of information consent forms completed and submitted to medical records by CSW.  Patient to Follow up at: Follow-up Information    [facility].   Specialty:  Behavioral Health Why:  Follow-up with transitional care team (TCT) at discharge. Contact person: Jashella [phone]. Walk in between 8am-9am Monday through Friday within 7 days of discharge for hospital follow-up if TCT does not schedule your next appt. Thank you.  Contact information: [address] [place] [address] [phone]           Next level of care provider [facility] Link:no  Safety Planning and Suicide Prevention discussed: Yes,  SPE completed with pt; pt declined to consent to family contact. SPI pamphlet and Mobile Crisis information provided.   Have you used any form of tobacco in the last 30 days? (Cigarettes, Smokeless Tobacco, Cigars, and/or Pipes): Yes  Has patient been referred to the Quitline?: Patient refused referral  Patient has been referred for addiction treatment: Yes  Jonee Lamore N Smart LCSW 03/11/2016, 10:47 AM

## 2016-03-11 NOTE — Progress Notes (Signed)
Nursing Progress Note 7p-7a  D) Patient presents pleasant, calm and cooperative. Patient was isolative to her room and asked if the AA meeting was "mandatory". Patient requesting medications several times. Patient states she "feels great" and that she "just needed a medication adjustment". Patient minimizes her admission to the hospital and appears to have poor insight. Patient denies (SI/HI/AVH or pain). Patient reports SI/HI/AVH or pain. Patient contracts for safety at this time.   A) Emotional support given. Patient medicated with PRN sleep medicine as prescribed. Patient on q15 min safety checks. Opportunities for questions or concerns presented to patient. Patient encouraged to continue to comply with goals of treatment.  R) Patient receptive to interaction with nurse. Patient remains safe on the unit at this time. Patient is resting in bed without complaints. Will continue to monitor.

## 2016-03-11 NOTE — Discharge Summary (Signed)
Physician Discharge Summary Note  Patient:  Amy Le is an 40 y.o., female MRN:  [id number] DOB:  Dec 14, 1975 Patient phone:  [phone] (home)  Patient address:   [address] [place] [id number],  Total Time spent with patient: 45 minutes  Date of Admission:  03/09/2016 Date of Discharge: 03/11/16  Reason for Admission:   Amy Le an 40 y.o.female with history of Bipolar I Disorder. [facility], escorted from her home. Patient reportedly suicidal.  She states that "I just get into phases.  I think the meds are working then I stop because I feel good and I think I don't need them,  then 5 days pass, then I get in trouble."   Patient states that she has good support from family, and tries her best to be a good mother, she has a 4710 year son.  Patient developed onset of suicidal thoughts was yesterday. She has a plan to slit her wrist.  Patient has history of trying to harm herself 2x's in the past (overdose and hang self).  [facility] for [facility] thoughts.  Principal Problem: Bipolar disorder, current episode depressed, moderate (HCC) Discharge Diagnoses: Patient Active Problem List   Diagnosis Date Noted  . Bipolar disorder, current episode depressed, moderate (HCC) [F31.32] 03/11/2016  . Cocaine use disorder, severe, dependence (HCC) [F14.20] 10/07/2015  . 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 Bipolar 1 disorder (HCC)   . BV (bacterial vaginosis)   . Chlamydia   . Trichomonas     Past Surgical History:  Procedure Laterality Date  . FOOT SURGERY    . MULTIPLE TOOTH EXTRACTIONS Bilateral 2017  . TUBAL LIGATION     Family History: History reviewed. No pertinent family history. Family  Psychiatric  History: see H&P Social History:  History  Alcohol Use No     History  Drug Use No    Social History   Social History  . Marital status: Divorced    Spouse name: N/A  . Number of children: N/A  . Years of education: N/A   Social History Main Topics  . Smoking status: Current Every Day Smoker    Packs/day: 0.50    Types: Cigarettes  . Smokeless tobacco: Never Used  . Alcohol use No  . Drug use: No  . Sexual activity: Yes    Birth control/ protection: Surgical   Other Topics Concern  . None   Social History Narrative  . None    Hospital Course:   Amy Le was admitted for Bipolar disorder, current episode depressed, moderate (HCC) , with psychosis and crisis management.  Pt was treated discharged with the medications listed below under Medication List.  Medical problems were identified and treated as needed.  Home medications were restarted as appropriate.  Improvement was monitored by observation and Amy Le 's daily report of symptom reduction.  Emotional and mental status was monitored by daily self-inventory reports completed by Amy Le and clinical staff.         Amy Le was evaluated by the treatment team for stability and plans for continued recovery upon discharge. Amy Le 's motivation was an integral factor for scheduling further treatment. Employment, transportation, bed availability, health status, family support, and any pending legal issues  were also considered during hospital stay. Pt was offered further treatment options upon discharge including but not limited to Residential, Intensive Outpatient, and Outpatient treatment.  Amy Le will follow up with the services as listed below under Follow Up Information.     Upon completion of this admission the patient was both mentally and medically stable for discharge denying suicidal/homicidal ideation, auditory/visual/tactile hallucinations, delusional  thoughts and paranoia.   Amy Le responded well to treatment with vistaril, ativan, latuda, zoloft, ambien without adverse effects. Pt demonstrated improvement without reported or observed adverse effects to the point of stability appropriate for outpatient management. Pertinent labs include: UDS+ barbiturates and cocaine, calcium 8.8L for which outpatient follow-up is necessary for lab recheck as mentioned below. Reviewed CBC, CMP, BAL, and UDS; all unremarkable aside from noted exceptions.    Physical Findings: AIMS: Facial and Oral Movements Muscles of Facial Expression: None, normal Lips and Perioral Area: None, normal Jaw: None, normal Tongue: None, normal,Extremity Movements Upper (arms, wrists, hands, fingers): None, normal Lower (legs, knees, ankles, toes): None, normal, Trunk Movements Neck, shoulders, hips: None, normal, Overall Severity Severity of abnormal movements (highest score from questions above): None, normal Incapacitation due to abnormal movements: None, normal Patient's awareness of abnormal movements (rate only patient's report): No Awareness, Dental Status Current problems with teeth and/or dentures?: No Does patient usually wear dentures?: No  CIWA:    COWS:     Musculoskeletal: Strength & Muscle Tone: within normal limits Gait & Station: normal Patient leans: N/A  Psychiatric Specialty Exam: Physical Exam  Review of Systems  Psychiatric/Behavioral: Positive for depression and substance abuse. Negative for hallucinations and suicidal ideas. The patient is nervous/anxious and has insomnia.   All other systems reviewed and are negative.   Blood pressure 125/85, pulse 86, temperature 98.2 F (36.8 C), temperature source Oral, resp. rate 18, height 5\' 6"  (1.676 m), weight 108.9 kg (240 lb), last menstrual period 03/03/2016, SpO2 100 %.Body mass index is 38.74 kg/m.  SEE MD PSE WITHIN SRA  Have you used any form of tobacco in the last 30 days?  (Cigarettes, Smokeless Tobacco, Cigars, and/or Pipes): Yes  Has this patient used any form of tobacco in the last 30 days? (Cigarettes, Smokeless Tobacco, Cigars, and/or Pipes) Yes, Yes, A prescription for an FDA-approved tobacco cessation medication was offered at discharge and the patient refused  Blood Alcohol level:  Lab Results  Component Value Date   ETH <5 03/09/2016   ETH <5 09/28/2015    Metabolic Disorder Labs:  No results found for: HGBA1C, MPG No results found for: PROLACTIN No results found for: CHOL, TRIG, HDL, CHOLHDL, VLDL, LDLCALC  See Psychiatric Specialty Exam and Suicide Risk Assessment completed by Attending Physician prior to discharge.  Discharge destination:  Home  Is patient on multiple antipsychotic therapies at discharge:  No   Has Patient had three or more failed trials of antipsychotic monotherapy by history:  No  Recommended Plan for Multiple Antipsychotic Therapies: NA   Allergies as of 03/11/2016   No Known Allergies     Medication List    STOP taking these medications   gabapentin 300 MG capsule Commonly known as:  NEURONTIN   hydrocerin Crea   ibuprofen 800 MG tablet Commonly known as:  ADVIL,MOTRIN   magic mouthwash w/lidocaine Soln   mirtazapine 15 MG tablet Commonly known as:  REMERON   nicotine 21 mg/24hr patch Commonly known as:  NICODERM CQ - dosed in mg/24 hours   predniSONE 20 MG tablet  Commonly known as:  DELTASONE   QUEtiapine 100 MG tablet Commonly known as:  SEROQUEL   QUEtiapine 400 MG tablet Commonly known as:  SEROQUEL     TAKE these medications     Indication  hydrOXYzine 25 MG tablet Commonly known as:  ATARAX/VISTARIL Take 1 tablet (25 mg total) by mouth every 6 (six) hours as needed for anxiety.  Indication:  Anxiety Neurosis, Anxiety   LORazepam 0.5 MG tablet Commonly known as:  ATIVAN Take 1 tablet (0.5 mg total) by mouth 2 (two) times daily as needed (anxiety).  Indication:  Feeling Anxious    lurasidone 40 MG Tabs tablet Commonly known as:  LATUDA Take 1 tablet (40 mg total) by mouth daily with breakfast. Start taking on:  03/12/2016  Indication:  mood stabilization   sertraline 50 MG tablet Commonly known as:  ZOLOFT Take 3 tablets (150 mg total) by mouth daily. Start taking on:  03/12/2016 What changed:  additional instructions  Indication:  Major Depressive Disorder   zolpidem 5 MG tablet Commonly known as:  AMBIEN Take 1 tablet (5 mg total) by mouth at bedtime as needed for sleep.  Indication:  Trouble Sleeping      Follow-up Information    [facility] Follow up.   Specialty:  Behavioral Health Why:  Follow-up with transitional care team (TCT) at discharge. Contact person: Jashella [phone]. Walk in between 8am-9am Monday through Friday within 7 days of discharge for hospital follow-up if TCT does not schedule your next appt. Thank you.  [address] [place] [address] [phone]           Follow-up recommendations:  Activity:  As tolerated Diet:  Heart healthy  Comments:   Take all medications as prescribed. Keep all follow-up appointments as scheduled.  Do not consume alcohol or use illegal drugs while on prescription medications. Report any adverse effects from your medications to your primary care provider promptly.  In the event of recurrent symptoms or worsening symptoms, call 911, a crisis hotline, or go to the nearest emergency department for evaluation.    Signed: Beau Fanny, FNP 03/11/2016, 10:50 AM

## 2016-03-11 NOTE — Plan of Care (Signed)
Problem: Safety: Goal: Ability to remain free from injury will improve Outcome: Progressing Patient remains safe on the unit at this time. Patient is on q15 min safety checks and is a low falls risk. Patient able to make needs known to staff.

## 2016-03-11 NOTE — Tx Team (Signed)
Interdisciplinary Treatment and Diagnostic Plan Update  03/11/2016 Time of Session: 9:30AM Amy Le MRN: [id number]  Principal Diagnosis: Bipolar disorder, current episode depressed, moderate ([place])  Secondary Diagnoses: Principal Problem:   Bipolar disorder, current episode depressed, moderate ([place]) Active Problems:   Cocaine use disorder, severe, dependence ([place])   Current Medications:  Current Facility-Administered Medications  Medication Dose Route Frequency Provider Last Rate Last Dose  . acetaminophen (TYLENOL) tablet 650 mg  650 mg Oral Q4H PRN Patrecia Pour, NP      . alum & mag hydroxide-simeth (MAALOX/MYLANTA) 200-200-20 MG/5ML suspension 30 mL  30 mL Oral PRN Patrecia Pour, NP      . hydrOXYzine (ATARAX/VISTARIL) tablet 25 mg  25 mg Oral Q6H PRN Patrecia Pour, NP   25 mg at 03/09/16 1632  . ibuprofen (ADVIL,MOTRIN) tablet 600 mg  600 mg Oral Q8H PRN Patrecia Pour, NP   600 mg at 03/09/16 1632  . LORazepam (ATIVAN) tablet 0.5 mg  0.5 mg Oral Q6H PRN Jenne Campus, MD   0.5 mg at 03/10/16 1815  . lurasidone (LATUDA) tablet 40 mg  40 mg Oral Q breakfast Patrecia Pour, NP   40 mg at 03/11/16 0858  . magnesium hydroxide (MILK OF MAGNESIA) suspension 30 mL  30 mL Oral Daily PRN Patrecia Pour, NP      . ondansetron [facility]) tablet 4 mg  4 mg Oral Q8H PRN Patrecia Pour, NP      . sertraline (ZOLOFT) tablet 150 mg  150 mg Oral Daily Patrecia Pour, NP   150 mg at 03/11/16 7628  . zolpidem (AMBIEN) tablet 5 mg  5 mg Oral QHS PRN Patrecia Pour, NP   5 mg at 03/10/16 2129   PTA Medications: Prescriptions Prior to Admission  Medication Sig Dispense Refill Last Dose  . gabapentin (NEURONTIN) 300 MG capsule Take 2 capsules (600 mg total) by mouth 4 (four) times daily. For agitation 240 capsule 0 Past Week at Unknown time  . hydrocerin (EUCERIN) CREA Apply 1 application topically 2 (two) times daily. For dry skin  0 03/08/2016 at Unknown time  . hydrOXYzine  (ATARAX/VISTARIL) 25 MG tablet Take 1 tablet (25 mg total) by mouth every 6 (six) hours as needed for anxiety. 60 tablet 0 Past Week at Unknown time  . ibuprofen (ADVIL,MOTRIN) 800 MG tablet Take 1 tablet (800 mg total) by mouth 3 (three) times daily. 30 tablet 0 03/08/2016 at Unknown time  . lurasidone (LATUDA) 40 MG TABS tablet Take 40 mg by mouth daily with breakfast.   Past Week at Unknown time  . magic mouthwash w/lidocaine SOLN Take 5 mLs by mouth 3 (three) times daily as needed for mouth pain. 30 mL 0 Past Week at Unknown time  . mirtazapine (REMERON) 15 MG tablet Take 1 tablet (15 mg total) by mouth at bedtime. For depression/insomnia 30 tablet 0 Past Week at Unknown time  . nicotine (NICODERM CQ - DOSED IN MG/24 HOURS) 21 mg/24hr patch Place 1 patch (21 mg total) onto the skin daily. For smoking cessation (Patient not taking: Reported on 03/09/2016) 28 patch 0 Not Taking at Unknown time  . predniSONE (DELTASONE) 20 MG tablet Take 2 tablets (40 mg total) by mouth daily. (Patient not taking: Reported on 03/09/2016) 10 tablet 0 Not Taking at Unknown time  . QUEtiapine (SEROQUEL) 100 MG tablet Take 1 tablet (100 mg total) by mouth 2 (two) times daily with breakfast and lunch. For agitation (Patient  not taking: Reported on 03/09/2016) 60 tablet 0 Not Taking at Unknown time  . QUEtiapine (SEROQUEL) 400 MG tablet Take 1 tablet (400 mg total) by mouth at bedtime. For mood control (Patient not taking: Reported on 03/09/2016) 30 tablet 0 Not Taking at Unknown time  . sertraline (ZOLOFT) 50 MG tablet Take 3 tablets (150 mg total) by mouth daily. For depression 90 tablet 0 Past Week at Unknown time    Patient Stressors: Marital or family conflict Medication change or noncompliance Substance abuse  Patient Strengths: Ability for insight Average or above average intelligence General fund of knowledge Physical Health  Treatment Modalities: Medication Management, Group therapy, Case management,  1 to  1 session with clinician, Psychoeducation, Recreational therapy.   Physician Treatment Plan for Primary Diagnosis: Bipolar disorder, current episode depressed, moderate ([place]) Long Term Goal(s): Improvement in symptoms so as ready for discharge Improvement in symptoms so as ready for discharge   Short Term Goals: Ability to identify changes in lifestyle to reduce recurrence of condition will improve Ability to verbalize feelings will improve Ability to disclose and discuss suicidal ideas Ability to demonstrate self-control will improve Ability to identify and develop effective coping behaviors will improve Ability to maintain clinical measurements within normal limits will improve Compliance with prescribed medications will improve Ability to identify triggers associated with substance abuse/mental health issues will improve Ability to identify changes in lifestyle to reduce recurrence of condition will improve Ability to verbalize feelings will improve Ability to disclose and discuss suicidal ideas Ability to demonstrate self-control will improve Ability to identify and develop effective coping behaviors will improve Ability to maintain clinical measurements within normal limits will improve Compliance with prescribed medications will improve Ability to identify triggers associated with substance abuse/mental health issues will improve  Medication Management: Evaluate patient's response, side effects, and tolerance of medication regimen.  Therapeutic Interventions: 1 to 1 sessions, Unit Group sessions and Medication administration.  Evaluation of Outcomes: Met  Physician Treatment Plan for Secondary Diagnosis: Principal Problem:   Bipolar disorder, current episode depressed, moderate ([place]) Active Problems:   Cocaine use disorder, severe, dependence ([place])  Long Term Goal(s): Improvement in symptoms so as ready for discharge Improvement in symptoms so as ready for discharge    Short Term Goals: Ability to identify changes in lifestyle to reduce recurrence of condition will improve Ability to verbalize feelings will improve Ability to disclose and discuss suicidal ideas Ability to demonstrate self-control will improve Ability to identify and develop effective coping behaviors will improve Ability to maintain clinical measurements within normal limits will improve Compliance with prescribed medications will improve Ability to identify triggers associated with substance abuse/mental health issues will improve Ability to identify changes in lifestyle to reduce recurrence of condition will improve Ability to verbalize feelings will improve Ability to disclose and discuss suicidal ideas Ability to demonstrate self-control will improve Ability to identify and develop effective coping behaviors will improve Ability to maintain clinical measurements within normal limits will improve Compliance with prescribed medications will improve Ability to identify triggers associated with substance abuse/mental health issues will improve     Medication Management: Evaluate patient's response, side effects, and tolerance of medication regimen.  Therapeutic Interventions: 1 to 1 sessions, Unit Group sessions and Medication administration.  Evaluation of Outcomes: Met   RN Treatment Plan for Primary Diagnosis: Bipolar disorder, current episode depressed, moderate ([place]) Long Term Goal(s): Knowledge of disease and therapeutic regimen to maintain health will improve  Short Term Goals: Ability to remain free  from injury will improve, Ability to disclose and discuss suicidal ideas and Ability to identify and develop effective coping behaviors will improve  Medication Management: RN will administer medications as ordered by provider, will assess and evaluate patient's response and provide education to patient for prescribed medication. RN will report any adverse and/or side effects to  prescribing provider.  Therapeutic Interventions: 1 on 1 counseling sessions, Psychoeducation, Medication administration, Evaluate responses to treatment, Monitor vital signs and CBGs as ordered, Perform/monitor CIWA, COWS, AIMS and Fall Risk screenings as ordered, Perform wound care treatments as ordered.  Evaluation of Outcomes: Met   LCSW Treatment Plan for Primary Diagnosis: Bipolar disorder, current episode depressed, moderate ([place]) Long Term Goal(s): Safe transition to appropriate next level of care at discharge, Engage patient in therapeutic group addressing interpersonal concerns.  Short Term Goals: Engage patient in aftercare planning with referrals and resources, Facilitate patient progression through stages of change regarding substance use diagnoses and concerns and Identify triggers associated with mental health/substance abuse issues  Therapeutic Interventions: Assess for all discharge needs, 1 to 1 time with Social worker, Explore available resources and support systems, Assess for adequacy in community support network, Educate family and significant other(s) on suicide prevention, Complete Psychosocial Assessment, Interpersonal group therapy.  Evaluation of Outcomes: Met  Progress in Treatment: Attending groups: Yes. Participating in groups: Yes. Taking medication as prescribed: Yes. Toleration medication: Yes. Family/Significant other contact made: SPE completed with pt; pt declined to consent to family contact. SPI pamphlet and Mobile Crisis information provided.  Patient understands diagnosis: Yes. Discussing patient identified problems/goals with staff: Yes. Medical problems stabilized or resolved: Yes. Denies suicidal/homicidal ideation: Yes. Issues/concerns per patient self-inventory: No. Other: n/a  New problem(s) identified: No, Describe:  n/a  New Short Term/Long Term Goal(s): medication stabilization; development of comprehensive mental wellness/sobriety plan;  detox.   Discharge Plan or Barriers: Pt is planning to return home; follow-up at [facility] (TCT).    Reason for Continuation of Hospitalization:: none  Estimated Length of Stay: d/c today   Attendees: Patient: 03/11/2016 10:47 AM  Physician: Dr. Shea Evans MD 03/11/2016 10:47 AM  Nursing: Lorelei Pont RN 03/11/2016 10:47 AM  RN Care Manager: 03/11/2016 10:47 AM  Social Worker: Maxie Better, LCSW; Adriana Reams LCSW 03/11/2016 10:47 AM  Recreational Therapist:  03/11/2016 10:47 AM  Other: Samuel Jester NP; Catalina Pizza NP 03/11/2016 10:47 AM  Other:  03/11/2016 10:47 AM  Other: 03/11/2016 10:47 AM    Scribe for Treatment Team: Kimber Relic Smart, LCSW 03/11/2016 10:47 AM

## 2016-03-13 ENCOUNTER — Encounter (HOSPITAL_COMMUNITY): Payer: Self-pay | Admitting: Emergency Medicine

## 2016-03-13 ENCOUNTER — Emergency Department (HOSPITAL_COMMUNITY)
Admission: EM | Admit: 2016-03-13 | Discharge: 2016-03-14 | Disposition: A | Discharge status: Home / Self Care | Payer: Medicaid Other | Attending: Emergency Medicine | Admitting: Emergency Medicine

## 2016-03-13 DIAGNOSIS — Z79899 Other long term (current) drug therapy: Secondary | ICD-10-CM | POA: Diagnosis not present

## 2016-03-13 DIAGNOSIS — F1420 Cocaine dependence, uncomplicated: Secondary | ICD-10-CM

## 2016-03-13 DIAGNOSIS — F319 Bipolar disorder, unspecified: Secondary | ICD-10-CM

## 2016-03-13 DIAGNOSIS — F1410 Cocaine abuse, uncomplicated: Secondary | ICD-10-CM

## 2016-03-13 DIAGNOSIS — F1494 Cocaine use, unspecified with cocaine-induced mood disorder: Secondary | ICD-10-CM | POA: Diagnosis not present

## 2016-03-13 DIAGNOSIS — F3132 Bipolar disorder, current episode depressed, moderate: Secondary | ICD-10-CM | POA: Diagnosis not present

## 2016-03-13 DIAGNOSIS — F17210 Nicotine dependence, cigarettes, uncomplicated: Secondary | ICD-10-CM | POA: Diagnosis not present

## 2016-03-13 DIAGNOSIS — R45851 Suicidal ideations: Secondary | ICD-10-CM | POA: Diagnosis present

## 2016-03-13 DIAGNOSIS — F1424 Cocaine dependence with cocaine-induced mood disorder: Secondary | ICD-10-CM | POA: Insufficient documentation

## 2016-03-13 DIAGNOSIS — Z9889 Other specified postprocedural states: Secondary | ICD-10-CM | POA: Diagnosis not present

## 2016-03-13 DIAGNOSIS — F14251 Cocaine dependence with cocaine-induced psychotic disorder with hallucinations: Secondary | ICD-10-CM | POA: Diagnosis present

## 2016-03-13 LAB — CBC
HCT: 40.1 % (ref 36.0–46.0)
Hemoglobin: 13.8 g/dL (ref 12.0–15.0)
MCH: 28 pg (ref 26.0–34.0)
MCHC: 34.4 g/dL (ref 30.0–36.0)
MCV: 81.5 fL (ref 78.0–100.0)
PLATELETS: 342 10*3/uL (ref 150–400)
RBC: 4.92 MIL/uL (ref 3.87–5.11)
RDW: 12.9 % (ref 11.5–15.5)
WBC: 5.3 10*3/uL (ref 4.0–10.5)

## 2016-03-13 LAB — RAPID URINE DRUG SCREEN, HOSP PERFORMED
Amphetamines: NOT DETECTED
Barbiturates: NOT DETECTED
Benzodiazepines: NOT DETECTED
COCAINE: POSITIVE — AB
OPIATES: NOT DETECTED
Tetrahydrocannabinol: NOT DETECTED

## 2016-03-13 LAB — COMPREHENSIVE METABOLIC PANEL
ALT: 20 U/L (ref 14–54)
ANION GAP: 10 (ref 5–15)
AST: 23 U/L (ref 15–41)
Albumin: 4.3 g/dL (ref 3.5–5.0)
Alkaline Phosphatase: 99 U/L (ref 38–126)
BUN: 13 mg/dL (ref 6–20)
CALCIUM: 8.7 mg/dL — AB (ref 8.9–10.3)
CHLORIDE: 100 mmol/L — AB (ref 101–111)
CO2: 22 mmol/L (ref 22–32)
CREATININE: 0.8 mg/dL (ref 0.44–1.00)
Glucose, Bld: 89 mg/dL (ref 65–99)
Potassium: 3.7 mmol/L (ref 3.5–5.1)
SODIUM: 132 mmol/L — AB (ref 135–145)
Total Bilirubin: 0.5 mg/dL (ref 0.3–1.2)
Total Protein: 7.9 g/dL (ref 6.5–8.1)

## 2016-03-13 LAB — ETHANOL

## 2016-03-13 LAB — ACETAMINOPHEN LEVEL

## 2016-03-13 LAB — SALICYLATE LEVEL

## 2016-03-13 MED ORDER — SERTRALINE HCL 50 MG PO TABS
150.0000 mg | ORAL_TABLET | Freq: Every day | ORAL | Status: DC
  Administered 2016-03-13 – 2016-03-14 (×2): 150 mg via ORAL
  Filled 2016-03-13 (×2): qty 3

## 2016-03-13 MED ORDER — LURASIDONE HCL 40 MG PO TABS
40.0000 mg | ORAL_TABLET | Freq: Every day | ORAL | Status: DC
  Administered 2016-03-14: 40 mg via ORAL
  Filled 2016-03-13: qty 1

## 2016-03-13 MED ORDER — NICOTINE 21 MG/24HR TD PT24
21.0000 mg | MEDICATED_PATCH | Freq: Every day | TRANSDERMAL | Status: DC | PRN
Start: 2016-03-13 — End: 2016-03-14

## 2016-03-13 MED ORDER — LORAZEPAM 1 MG PO TABS
2.0000 mg | ORAL_TABLET | Freq: Once | ORAL | Status: AC
  Administered 2016-03-13: 2 mg via ORAL
  Filled 2016-03-13: qty 2

## 2016-03-13 MED ORDER — TRAZODONE HCL 50 MG PO TABS
50.0000 mg | ORAL_TABLET | Freq: Every evening | ORAL | Status: DC | PRN
  Administered 2016-03-13: 50 mg via ORAL
  Filled 2016-03-13: qty 1

## 2016-03-13 MED ORDER — TRAZODONE HCL 50 MG PO TABS: 50.0000 mg | ORAL_TABLET | Freq: Every day | ORAL | Status: DC

## 2016-03-13 MED ORDER — HYDROXYZINE HCL 25 MG PO TABS
25.0000 mg | ORAL_TABLET | Freq: Four times a day (QID) | ORAL | Status: DC | PRN
  Administered 2016-03-13: 25 mg via ORAL
  Filled 2016-03-13: qty 1

## 2016-03-13 MED ORDER — ACETAMINOPHEN 325 MG PO TABS
650.0000 mg | ORAL_TABLET | ORAL | Status: DC | PRN
  Administered 2016-03-13: 650 mg via ORAL
  Filled 2016-03-13: qty 2

## 2016-03-13 MED ORDER — ZOLPIDEM TARTRATE 5 MG PO TABS: 5.0000 mg | ORAL_TABLET | Freq: Every evening | ORAL | Status: DC | PRN

## 2016-03-13 MED ORDER — ZIPRASIDONE MESYLATE 20 MG IM SOLR
20.0000 mg | Freq: Once | INTRAMUSCULAR | Status: DC
  Filled 2016-03-13: qty 20

## 2016-03-13 MED ORDER — ONDANSETRON HCL 4 MG PO TABS: 4.0000 mg | ORAL_TABLET | Freq: Three times a day (TID) | ORAL | Status: DC | PRN

## 2016-03-13 MED ORDER — IBUPROFEN 200 MG PO TABS: 600.0000 mg | ORAL_TABLET | Freq: Three times a day (TID) | ORAL | Status: DC | PRN

## 2016-03-13 MED ORDER — ALUM & MAG HYDROXIDE-SIMETH 200-200-20 MG/5ML PO SUSP: 30.0000 mL | ORAL | Status: DC | PRN

## 2016-03-13 MED ORDER — LORAZEPAM 2 MG/ML IJ SOLN
2.0000 mg | Freq: Once | INTRAMUSCULAR | Status: DC
  Filled 2016-03-13: qty 1

## 2016-03-13 NOTE — ED Notes (Addendum)
Bed: AV40WA30 Expected date:  Expected time:  Means of arrival:  Comments: [employer]- med clearance

## 2016-03-13 NOTE — ED Notes (Signed)
Patient continues to ask for ativan even though nurse explained that she normally only gets 0.5mg  twice daily and she had 2mg  earlier.  Patient then asked nurse to see if she could get the order switched to 1mg  twice daily.
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
## 2016-03-13 NOTE — ED Notes (Signed)
Patient seen awake watching TV. Presents with depressed mood but cheerful upon approach. Denies pain, SI, AH/VH at this time. Endorses mild depression. Patient reports Insomnia and requests to get Ambien at bed time. (patient was informed that her Ambien was discontinued and per Provider on call Barbara Cower- Jason, she can get Trazodone. Patient not too happy about that but accepted to take it.  Patient remains safe and appropriate. Staff offered support and encouragement as needed. Routine safety checks maintained. Will continue to monitor patient.

## 2016-03-13 NOTE — ED Notes (Signed)
Introduced self to patient. Pt oriented to unit expectations.  Assessed pt for:  A) Anxiety &/or agitation: Pt reports anxiety and wanted to know what medications she has ordered for anxiety. She is cooperative but appears to be irritable and depressed. Positive SI.   S) Safety: Safety maintained with q-15-minute checks and hourly rounds by staff.   A) ADLs: Pt able to perform ADLs independently.  P) Pick-Up (room cleanliness): Pt's room clean and free of clutter.  '

## 2016-03-13 NOTE — ED Notes (Signed)
Patient's belongings wanded and placed in locker # 30.

## 2016-03-13 NOTE — Progress Notes (Signed)
Patient has been [facility] [place]  [place] Abtsatia Tinaya Ceballos, LCSWA Disposition staff 03/13/2016 4:38 PM
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
## 2016-03-14 DIAGNOSIS — F17210 Nicotine dependence, cigarettes, uncomplicated: Secondary | ICD-10-CM

## 2016-03-14 DIAGNOSIS — F1494 Cocaine use, unspecified with cocaine-induced mood disorder: Secondary | ICD-10-CM | POA: Diagnosis not present

## 2016-03-14 DIAGNOSIS — F3132 Bipolar disorder, current episode depressed, moderate: Secondary | ICD-10-CM | POA: Diagnosis not present

## 2016-03-14 DIAGNOSIS — Z9889 Other specified postprocedural states: Secondary | ICD-10-CM

## 2016-03-14 DIAGNOSIS — Z79899 Other long term (current) drug therapy: Secondary | ICD-10-CM

## 2016-03-14 MED ORDER — ZOLPIDEM TARTRATE 5 MG PO TABS
5.0000 mg | ORAL_TABLET | Freq: Once | ORAL | Status: AC
  Administered 2016-03-14: 5 mg via ORAL
  Filled 2016-03-14: qty 1
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
## 2016-03-14 NOTE — ED Notes (Signed)
Discharge note:  [facility].  Social worker called a taxi for patient and she left ambulatory for the lobby.  Patient was passively suicidal and was able to contract for safety.  Patient did not appear to be responding to internal stimuli.  Her behavior was appropriate.  It should be noted patient did leave a cane here.  It will be held in case she calls back for it.  She did leave with the rest of her belongings.
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
## 2016-03-23 ENCOUNTER — Encounter (HOSPITAL_COMMUNITY): Payer: Self-pay | Admitting: Emergency Medicine

## 2016-03-23 ENCOUNTER — Emergency Department (HOSPITAL_COMMUNITY)
Admission: EM | Admit: 2016-03-23 | Discharge: 2016-03-24 | Disposition: A | Discharge status: Other Acute Inpatient Hospital | Payer: Medicaid Other | Attending: Emergency Medicine | Admitting: Emergency Medicine

## 2016-03-23 ENCOUNTER — Emergency Department (HOSPITAL_COMMUNITY): Payer: Medicaid Other

## 2016-03-23 DIAGNOSIS — Z79899 Other long term (current) drug therapy: Secondary | ICD-10-CM | POA: Insufficient documentation

## 2016-03-23 DIAGNOSIS — R079 Chest pain, unspecified: Secondary | ICD-10-CM | POA: Insufficient documentation

## 2016-03-23 DIAGNOSIS — F17210 Nicotine dependence, cigarettes, uncomplicated: Secondary | ICD-10-CM | POA: Diagnosis not present

## 2016-03-23 DIAGNOSIS — R440 Auditory hallucinations: Secondary | ICD-10-CM | POA: Diagnosis not present

## 2016-03-23 DIAGNOSIS — R45851 Suicidal ideations: Secondary | ICD-10-CM | POA: Diagnosis present

## 2016-03-23 LAB — CBC WITH DIFFERENTIAL/PLATELET
BASOS ABS: 0 10*3/uL (ref 0.0–0.1)
BASOS PCT: 0 %
EOS ABS: 0.3 10*3/uL (ref 0.0–0.7)
EOS PCT: 7 %
HCT: 39.9 % (ref 36.0–46.0)
Hemoglobin: 13.5 g/dL (ref 12.0–15.0)
LYMPHS PCT: 26 %
Lymphs Abs: 1.2 10*3/uL (ref 0.7–4.0)
MCH: 28.4 pg (ref 26.0–34.0)
MCHC: 33.8 g/dL (ref 30.0–36.0)
MCV: 83.8 fL (ref 78.0–100.0)
Monocytes Absolute: 0.7 10*3/uL (ref 0.1–1.0)
Monocytes Relative: 15 %
Neutro Abs: 2.4 10*3/uL (ref 1.7–7.7)
Neutrophils Relative %: 52 %
PLATELETS: 263 10*3/uL (ref 150–400)
RBC: 4.76 MIL/uL (ref 3.87–5.11)
RDW: 13.6 % (ref 11.5–15.5)
WBC: 4.6 10*3/uL (ref 4.0–10.5)

## 2016-03-23 LAB — BASIC METABOLIC PANEL
ANION GAP: 9 (ref 5–15)
BUN: 7 mg/dL (ref 6–20)
CALCIUM: 9.1 mg/dL (ref 8.9–10.3)
CO2: 23 mmol/L (ref 22–32)
CREATININE: 0.99 mg/dL (ref 0.44–1.00)
Chloride: 106 mmol/L (ref 101–111)
GLUCOSE: 117 mg/dL — AB (ref 65–99)
Potassium: 3.9 mmol/L (ref 3.5–5.1)
Sodium: 138 mmol/L (ref 135–145)

## 2016-03-23 LAB — I-STAT TROPONIN, ED
TROPONIN I, POC: 0 ng/mL (ref 0.00–0.08)
TROPONIN I, POC: 0.01 ng/mL (ref 0.00–0.08)

## 2016-03-23 LAB — URINALYSIS, ROUTINE W REFLEX MICROSCOPIC
GLUCOSE, UA: NEGATIVE mg/dL
Ketones, ur: NEGATIVE mg/dL
Leukocytes, UA: NEGATIVE
Nitrite: NEGATIVE
PH: 6 (ref 5.0–8.0)
Protein, ur: NEGATIVE mg/dL
Specific Gravity, Urine: >1.03 — ABNORMAL HIGH (ref 1.005–1.030)

## 2016-03-23 LAB — URINALYSIS, MICROSCOPIC (REFLEX)

## 2016-03-23 LAB — RAPID URINE DRUG SCREEN, HOSP PERFORMED
AMPHETAMINES: NOT DETECTED
BARBITURATES: NOT DETECTED
BENZODIAZEPINES: NOT DETECTED
Cocaine: POSITIVE — AB
Opiates: NOT DETECTED
TETRAHYDROCANNABINOL: NOT DETECTED

## 2016-03-23 LAB — PROTIME-INR
INR: 1.05
Prothrombin Time: 13.7 seconds (ref 11.4–15.2)

## 2016-03-23 LAB — D-DIMER, QUANTITATIVE (NOT AT ARMC): D DIMER QUANT: 0.33 ug{FEU}/mL (ref 0.00–0.50)

## 2016-03-23 LAB — POC URINE PREG, ED: Preg Test, Ur: NEGATIVE

## 2016-03-23 MED ORDER — SERTRALINE HCL 50 MG PO TABS
150.0000 mg | ORAL_TABLET | Freq: Every day | ORAL | Status: DC
  Administered 2016-03-23 – 2016-03-24 (×2): 150 mg via ORAL
  Filled 2016-03-23 (×2): qty 3

## 2016-03-23 MED ORDER — LURASIDONE HCL 40 MG PO TABS
40.0000 mg | ORAL_TABLET | Freq: Every day | ORAL | Status: DC
  Administered 2016-03-24: 40 mg via ORAL
  Filled 2016-03-23: qty 1

## 2016-03-23 MED ORDER — ZOLPIDEM TARTRATE 5 MG PO TABS: 5.0000 mg | ORAL_TABLET | Freq: Every evening | ORAL | Status: DC | PRN

## 2016-03-23 MED ORDER — IBUPROFEN 800 MG PO TABS: 800.0000 mg | ORAL_TABLET | Freq: Four times a day (QID) | ORAL | Status: DC | PRN

## 2016-03-23 MED ORDER — HYDROXYZINE HCL 25 MG PO TABS: 25.0000 mg | ORAL_TABLET | Freq: Four times a day (QID) | ORAL | Status: DC | PRN

## 2016-03-23 MED ORDER — LORAZEPAM 0.5 MG PO TABS
0.5000 mg | ORAL_TABLET | Freq: Two times a day (BID) | ORAL | Status: DC | PRN
  Administered 2016-03-23: 0.5 mg via ORAL
  Filled 2016-03-23: qty 1

## 2016-03-23 NOTE — ED Notes (Signed)
Dr Judd Lienelo, per [name], [facility], pt meets inpt criteria - ordering home meds.

## 2016-03-23 NOTE — ED Notes (Signed)
TTS being performed.  

## 2016-03-23 NOTE — ED Notes (Signed)
[place] [facility] [facility], copy sent to medical records - original placed in folder for Magistrate.

## 2016-03-23 NOTE — ED Notes (Signed)
IVC papers being served. 

## 2016-03-23 NOTE — ED Notes (Signed)
Staffing Office and Ron, [facility] - aware of need for sitter.

## 2016-03-23 NOTE — ED Notes (Addendum)
Pt given Ativan as requested for anxiety. Pt talking and laughing w/staff.

## 2016-03-23 NOTE — ED Notes (Signed)
Placed patient up to the monitor did ekg shown to the er doctor

## 2016-03-23 NOTE — ED Notes (Signed)
Pt signed Medical Clearance Pt Policy form - voiced understanding. Copy given to pt and original placed on clipboard.

## 2016-03-23 NOTE — ED Notes (Signed)
Magistrate advised has IVC paperwork.

## 2016-03-23 NOTE — ED Notes (Signed)
Pt noted to be wearing burgundy shirt and personal pants d/t no 3X size pants.

## 2016-03-23 NOTE — ED Provider Notes (Signed)
[facility]-EMERGENCY DEPT Provider Note   CSN: [id number] Arrival date & time: 03/23/16  1338     History   Chief Complaint Chief Complaint  Patient presents with  . Mental Health Problem  . Chest Pain    HPI Amy Le is a 41 y.o. female with a past medical history significant for bipolar disorder who presents with chest pain and suicidal ideation. Patient reports that for the last 2 days, she has intermittent chest pain that is in her central chest. She says that it is a sharp and throbbing pain and at maximum was an 8 out of 10. She says that she took nitroglycerin with EMS and the pain reduced to 0 out of 10. She says that she is having some associated shortness of breath and chest tightness but that has also resolved. She denies recent fevers or chills but does report a dry cough and rhinorrhea for the last few days. She denies any recent chest trauma or exertional chest pain. She says that deep breathing made the pain slightly worse.  During discussion, patient says that her stress is also increased and when this was further discussed, patient reports she is suicidal. She says that she is currently having active SI. She says that she is "not telling" her plans to kill herself however she reports that she has tried to hang herself previously as well as cutting herself. She says that she has been admitted in December for suicidal ideation. She says that she is also had return of hallucinations including hearing voices tell her to kill herself. She is currently caring these. Patient denies homicidal ideations or any current substance abuse. Patient does report that she is having some paranoia thinking the police are after her. She denies any other complaints on arrival.        The history is provided by the patient and medical records. No language interpreter was used.  Mental Health Problem  Presenting symptoms: hallucinations and suicidal thoughts   Presenting symptoms: no agitation,  no homicidal ideas, no suicidal threats and no suicide attempt   Degree of incapacity (severity):  Severe Onset quality:  Gradual Duration:  2 days Timing:  Constant Progression:  Worsening Chronicity:  Recurrent Context: noncompliance   Treatment compliance:  Untreated Relieved by:  Nothing Worsened by:  Nothing Ineffective treatments:  None tried Associated symptoms: chest pain   Associated symptoms: no abdominal pain, no fatigue and no headaches   Risk factors: hx of mental illness and hx of suicide attempts     Past Medical History:  Diagnosis Date  . Bipolar 1 disorder (HCC)   . BV (bacterial vaginosis)   . Chlamydia   . Trichomonas     Patient Active Problem List   Diagnosis Date Noted  . Bipolar disorder, current episode depressed, moderate (HCC) 03/11/2016  . Cocaine use disorder, severe, dependence (HCC) 10/07/2015  . 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 FOOT SURGERY    . MULTIPLE TOOTH EXTRACTIONS Bilateral 2017  . TUBAL LIGATION      OB History    No data available       Home Medications    Prior to Admission medications   Medication Sig Start Date End Date Taking? Authorizing Provider  hydrOXYzine (ATARAX/VISTARIL) 25 MG tablet Take 1 tablet (25 mg total) by mouth every 6 (six) hours as needed for anxiety. 03/11/16   Beau Fanny, FNP  ibuprofen (ADVIL,MOTRIN) 800 MG tablet  Take 800 mg by mouth 2 (two) times daily as needed for pain. 03/05/16   Historical Provider, MD  LORazepam (ATIVAN) 0.5 MG tablet Take 1 tablet (0.5 mg total) by mouth 2 (two) times daily as needed (anxiety). 03/11/16   Beau Fanny, FNP  lurasidone (LATUDA) 40 MG TABS tablet Take 1 tablet (40 mg total) by mouth daily with breakfast. 03/12/16   Beau Fanny, FNP  sertraline (ZOLOFT) 50 MG tablet Take 3 tablets (150 mg total) by mouth daily. 03/12/16   Beau Fanny, FNP  zolpidem (AMBIEN) 5 MG tablet Take 1  tablet (5 mg total) by mouth at bedtime as needed for sleep. 03/11/16   Beau Fanny, FNP    Family History History reviewed. No pertinent family history.  Social History Social History  Substance Use Topics  . Smoking status: Current Every Day Smoker    Packs/day: 0.50    Types: Cigarettes  . Smokeless tobacco: Never Used  . Alcohol use No     Allergies   Patient has no known allergies.   Review of Systems Review of Systems  Constitutional: Negative for activity change, chills, diaphoresis, fatigue and fever.  HENT: Negative for congestion and rhinorrhea.   Eyes: Negative for visual disturbance.  Respiratory: Positive for cough, chest tightness and shortness of breath. Negative for wheezing and stridor.   Cardiovascular: Positive for chest pain. Negative for palpitations and leg swelling.  Gastrointestinal: Negative for abdominal distention, abdominal pain, constipation, diarrhea, nausea and vomiting.  Genitourinary: Negative for difficulty urinating, dysuria, flank pain, frequency, hematuria, menstrual problem, pelvic pain, vaginal bleeding and vaginal discharge.  Musculoskeletal: Negative for back pain and neck pain.  Skin: Negative for rash and wound.  Neurological: Negative for dizziness, weakness, light-headedness, numbness and headaches.  Psychiatric/Behavioral: Positive for hallucinations and suicidal ideas. Negative for agitation, confusion and homicidal ideas.  All other systems reviewed and are negative.    Physical Exam Updated Vital Signs BP 118/66 (BP Location: Right Arm)   Temp 98.5 F (36.9 C) (Oral)   Resp 16   Ht 5\' 6"  (1.676 m)   Wt 240 lb (108.9 kg)   LMP 03/03/2016   SpO2 98%   BMI 38.74 kg/m   Physical Exam  Constitutional: She is oriented to person, place, and time. She appears well-developed and well-nourished. No distress.  HENT:  Head: Normocephalic and atraumatic.  Mouth/Throat: Oropharynx is clear and moist. No oropharyngeal  exudate.  Eyes: Conjunctivae are normal. Pupils are equal, round, and reactive to light.  Neck: Normal range of motion. Neck supple.  Cardiovascular: Regular rhythm.  Tachycardia present.   No murmur heard. Pulmonary/Chest: Effort normal and breath sounds normal. No stridor. No respiratory distress. She has no wheezes. She exhibits tenderness.  Abdominal: Soft. There is no tenderness.  Musculoskeletal: She exhibits no edema or tenderness.  Neurological: She is alert and oriented to person, place, and time. No sensory deficit. She exhibits normal muscle tone.  Skin: Skin is warm and dry. Capillary refill takes less than 2 seconds. No rash noted. No erythema.  Psychiatric: She is actively hallucinating. Thought content is paranoid.  Command auditory hallucinations telling her to kill herself. Also paranoia that the police is after.  Nursing note and vitals reviewed.    ED Treatments / Results  Labs (all labs ordered are listed, but only abnormal results are displayed) Labs Reviewed  BASIC METABOLIC PANEL - Abnormal; Notable for the following:       Result Value   Glucose,  Bld 117 (*)    All other components within normal limits  URINALYSIS, ROUTINE W REFLEX MICROSCOPIC - Abnormal; Notable for the following:    Specific Gravity, Urine >1.030 (*)    Hgb urine dipstick TRACE (*)    Bilirubin Urine SMALL (*)    All other components within normal limits  RAPID URINE DRUG SCREEN, HOSP PERFORMED - Abnormal; Notable for the following:    Cocaine POSITIVE (*)    All other components within normal limits  URINALYSIS, MICROSCOPIC (REFLEX) - Abnormal; Notable for the following:    Bacteria, UA FEW (*)    Squamous Epithelial / LPF 6-30 (*)    All other components within normal limits  CBC WITH DIFFERENTIAL/PLATELET  PROTIME-INR  D-DIMER, QUANTITATIVE (NOT AT [facility])  ACETAMINOPHEN LEVEL  SALICYLATE LEVEL  POC URINE PREG, ED  I-STAT TROPOININ, ED    EKG  EKG  Interpretation  Date/Time:  Tuesday March 23 2016 13:44:32 EST Ventricular Rate:  100 PR Interval:    QRS Duration: 90 QT Interval:  363 QTC Calculation: 469 R Axis:   76 Text Interpretation:  Sinus tachycardia Right atrial enlargement Left ventricular hypertrophy When compared to prior, no significant changes were seen.  No STEMI Confirmed by Rush LandmarkEGELER MD, CHRISTOPHER [phone]([id number] on 03/23/2016 3:14:11 PM       Radiology Dg Chest 2 View  Result Date: 03/23/2016 CLINICAL DATA:  One day of generalized chest pain and shortness of breath. Current smoker. No cardiopulmonary history. EXAM: CHEST  2 VIEW COMPARISON:  PA and lateral chest x-ray of March 21, 2012 FINDINGS: The lungs are adequately inflated and clear. The heart and pulmonary vascularity are normal. The mediastinum is normal in width. There is stable reverse S shaped cervico-thoracic-lumbar scoliosis. IMPRESSION: There is no evidence of pneumonia nor other acute cardiopulmonary abnormality. Electronically Signed   By: David  SwazilandJordan M.D.   On: 03/23/2016 15:08    Procedures Procedures (including critical care time)  Medications Ordered in ED Medications - No data to display   Initial Impression / Assessment and Plan / ED Course  I have reviewed the triage vital signs and the nursing notes.  Pertinent labs & imaging results that were available during my care of the patient were reviewed by me and considered in my medical decision making (see chart for details).  Clinical Course     Amy Le is a 41 y.o. female with a past medical history significant for bipolar disorder who presents with chest pain and suicidal ideation.   history and exam are seen above. On exam, patient had some tenderness in her central chest. Patient had clear lungs. Abdomen was nontender and neurologic exam was unremarkable. No focal neurologic deficits. Patient does have current suicidal ideation and she is reporting auditory hallucinations at this  time.  Shortly after exam, involuntary commitment paperwork was filled out. Patient placed under IVC for her danger to herself at this time.   For chest pain, patient will have workup including EKG, chest x-ray, and labs including d-dimer.  EKG was taken in triage and showed a sinus tachycardia. No evidence of acute ischemia. Initial troponin negative. D-dimer negative.   Analysis shows positive cocaine use. No evidence of urinary traction with negative nitrites and leukocytes. CBC unremarkable and CMP showed no evidence of kidney dysfunction or electrolyte abnormality.  Suspect stress-related chest pain however, patient will need her second troponin prior to medical clearance prior to TTS evaluation.  Troponin negative x2. PT is medically cleared for TTS eval.  Final Clinical Impressions(s) / ED Diagnoses   Final diagnoses:  Suicidal ideation  Chest pain, unspecified type  Auditory hallucinations    Clinical Impression: 1. Suicidal ideation   2. Chest pain, unspecified type   3. Auditory hallucinations     Disposition: Awaiting Psychaitry clearance for SI with hx of attempt   Heide Scales, MD 03/23/16 2008

## 2016-03-23 NOTE — ED Triage Notes (Signed)
Pt c/o chest pain off and on since last night "it gets tight" pointing to anterior chest that lasts approx 30 seconds.  Pt was given ASA 324mg  po, one ntg SL.  Pain gone now.  States she is feeling suicidal for months.  Just hospitalized for SI end of December.  States she took her meds until she ran out two days ago.

## 2016-03-23 NOTE — BH Assessment (Signed)
Tele Assessment Note   Amy Le is an 41 y.o. female who came to the ED with complaints of chest pain and suicidal ideations. She states that she has had SI for months but her depression has gotten worse over the holidays because her son went to live with his Dad. She is currently living with a friend but does not have a home of her own. Pt states that she was in her bathroom today with a razor blade about to cut her wrist when she decided to get help instead. She states that she has attempted suicide in the past with the most recent attempt one year ago by hanging. She states that she has a history of crack/cocaine use with her last use being 2 days ago. She states that she also hears voices and has a history of this. She last heard voices yesterday telling her to kill herself yesterday. Pt goes to [facility] for her medication management but has not been taking her meds since her last admission to [facility] 2 weeks ago.   Inpatient recommended per Claudette Head NP. TTS to find placement.   Diagnosis: Bipolar 1 Disorder   Past Medical History:  Past Medical History:  Diagnosis Date  . Bipolar 1 disorder (HCC)   . BV (bacterial vaginosis)   . Chlamydia   . Trichomonas     Past Surgical History:  Procedure Laterality Date  . FOOT SURGERY    . MULTIPLE TOOTH EXTRACTIONS Bilateral 2017  . TUBAL LIGATION      Family History: History reviewed. No pertinent family history.  Social History:  reports that she has been smoking Cigarettes.  She has been smoking about 0.50 packs per day. She has never used smokeless tobacco. She reports that she does not drink alcohol or use drugs.  Additional Social History:  Alcohol / Drug Use History of alcohol / drug use?: Yes Substance #1 Name of Substance 1: Cocaine  CIWA: CIWA-Ar BP: 120/59 Pulse Rate: 92 COWS:    PATIENT STRENGTHS: (choose at least two) Average or above average intelligence General fund of knowledge  Allergies: No Known  Allergies  Home Medications:  (Not in a hospital admission)  OB/GYN Status:  Patient's last menstrual period was 03/03/2016.  General Assessment Data Location of Assessment: [facility] ED TTS Assessment: In system Is this a Tele or Face-to-Face Assessment?: Tele Assessment Is this an Initial Assessment or a Re-assessment for this encounter?: Initial Assessment Marital status: Single Is patient pregnant?: No Pregnancy Status: No Living Arrangements: Non-relatives/Friends Can pt return to current living arrangement?: No Admission Status: Involuntary Is patient capable of signing voluntary admission?: No Referral Source: Self/Family/Friend Insurance type: medicaid     Crisis Care Plan Living Arrangements: Non-relatives/Friends Legal Guardian:  (None) Name of Psychiatrist: Transport planner Name of Therapist: [facility]  Education Status Is patient currently in school?: No Highest grade of school patient has completed: GED Name of school: NA  Contact person: NA  Risk to self with the past 6 months Suicidal Ideation: Yes-Currently Present Has patient been a risk to self within the past 6 months prior to admission? : Yes Suicidal Intent: Yes-Currently Present Has patient had any suicidal intent within the past 6 months prior to admission? : Yes Is patient at risk for suicide?: Yes Suicidal Plan?: Yes-Currently Present Has patient had any suicidal plan within the past 6 months prior to admission? : Yes Specify Current Suicidal Plan: Pt states she ws going to cut her wrist Access to Means: Yes Specify Access to  Suicidal Means: access to razor blade What has been your use of drugs/alcohol within the last 12 months?: uses cocaine recreationally Previous Attempts/Gestures: Yes How many times?: 2 Other Self Harm Risks: NA Triggers for Past Attempts: Hallucinations, Unpredictable Intentional Self Injurious Behavior: Cutting Comment - Self Injurious Behavior: history of cutting Family Suicide  History: No Recent stressful life event(s): Other (Comment) Persecutory voices/beliefs?: Yes Depression: Yes Depression Symptoms: Feeling worthless/self pity, Despondent Substance abuse history and/or treatment for substance abuse?: Yes Suicide prevention information given to non-admitted patients: Not applicable  Risk to Others within the past 6 months Homicidal Ideation: No Does patient have any lifetime risk of violence toward others beyond the six months prior to admission? : No Thoughts of Harm to Others: No Current Homicidal Intent: No Current Homicidal Plan: No Access to Homicidal Means: No Describe Access to Homicidal Means: None Identified Victim: None History of harm to others?: No Assessment of Violence: None Noted Does patient have access to weapons?: No Criminal Charges Pending?: No Does patient have a court date: No Is patient on probation?: No  Psychosis Hallucinations: Auditory Delusions: None noted  Mental Status Report Appearance/Hygiene: In scrubs Eye Contact: Good Motor Activity: Freedom of movement Speech: Logical/coherent Level of Consciousness: Alert Mood: Depressed Affect: Depressed Anxiety Level: Moderate Thought Processes: Coherent Judgement: Impaired Orientation: Person, Place, Time, Situation Obsessive Compulsive Thoughts/Behaviors: None  Cognitive Functioning Concentration: Decreased Memory: Recent Intact, Remote Intact IQ: Average Insight: Poor Impulse Control: Poor Appetite: Fair Weight Loss: 0 Weight Gain: 0 Sleep: Decreased Total Hours of Sleep: 4 Vegetative Symptoms: None  ADLScreening [facility] Assessment Services) Patient's cognitive ability adequate to safely complete daily activities?: Yes Patient able to express need for assistance with ADLs?: Yes Independently performs ADLs?: Yes (appropriate for developmental age)  Prior Inpatient Therapy Prior Inpatient Therapy: Yes Prior Therapy Dates: 03-05-2017 Prior Therapy  Facilty/Provider(s): [facility] Reason for Treatment: Bipolar  Prior Outpatient Therapy Prior Outpatient Therapy: Yes Prior Therapy Dates: Current Prior Therapy Facilty/Provider(s): [facility] Reason for Treatment: Bipolar 1 Does patient have an ACCT team?: No Does patient have Intensive In-House Services?  : No Does patient have [facility] services? : Yes Does patient have P4CC services?: No  ADL Screening (condition at time of admission) Patient's cognitive ability adequate to safely complete daily activities?: Yes Is the patient deaf or have difficulty hearing?: No Does the patient have difficulty seeing, even when wearing glasses/contacts?: No Does the patient have difficulty concentrating, remembering, or making decisions?: No Patient able to express need for assistance with ADLs?: Yes Does the patient have difficulty dressing or bathing?: No Independently performs ADLs?: Yes (appropriate for developmental age) Does the patient have difficulty walking or climbing stairs?: No Weakness of Legs: None Weakness of Arms/Hands: None  Home Assistive Devices/Equipment Home Assistive Devices/Equipment: None  Therapy Consults (therapy consults require a physician order) PT Evaluation Needed: No OT Evalulation Needed: No SLP Evaluation Needed: No Abuse/Neglect Assessment (Assessment to be complete while patient is alone) Physical Abuse: Denies Verbal Abuse: Denies Sexual Abuse: Denies Exploitation of patient/patient's resources: Denies Self-Neglect: Denies Values / Beliefs Cultural Requests During Hospitalization: None Spiritual Requests During Hospitalization: None Consults Spiritual Care Consult Needed: No Social Work Consult Needed: No Merchant navy officer (For Healthcare) Does Patient Have a Medical Advance Directive?: No Nutrition Screen- [facility] Adult/[facility]/AP Patient's home diet: Regular Has the patient recently lost weight without trying?: No Has the patient been eating poorly because of a  decreased appetite?: No Malnutrition Screening Tool Score: 0  Additional Information 1:1 In Past 12  Months?: No CIRT Risk: No Elopement Risk: No Does patient have medical clearance?: Yes     Disposition:  Disposition Initial Assessment Completed for this Encounter: Yes Disposition of Patient: Inpatient treatment program Type of inpatient treatment program: Adult  Amy Le 03/23/2016 5:04 PM

## 2016-03-24 ENCOUNTER — Inpatient Hospital Stay (HOSPITAL_COMMUNITY)
Admission: AD | Admit: 2016-03-24 | Discharge: 2016-03-26 | DRG: 885 | Disposition: A | Discharge status: Home / Self Care | Payer: Medicaid Other | Attending: Psychiatry | Admitting: Psychiatry

## 2016-03-24 ENCOUNTER — Encounter (HOSPITAL_COMMUNITY): Payer: Self-pay | Admitting: *Deleted

## 2016-03-24 DIAGNOSIS — R440 Auditory hallucinations: Secondary | ICD-10-CM | POA: Diagnosis not present

## 2016-03-24 DIAGNOSIS — F3132 Bipolar disorder, current episode depressed, moderate: Secondary | ICD-10-CM | POA: Diagnosis present

## 2016-03-24 DIAGNOSIS — F319 Bipolar disorder, unspecified: Secondary | ICD-10-CM | POA: Diagnosis present

## 2016-03-24 DIAGNOSIS — F17210 Nicotine dependence, cigarettes, uncomplicated: Secondary | ICD-10-CM | POA: Diagnosis present

## 2016-03-24 DIAGNOSIS — F419 Anxiety disorder, unspecified: Secondary | ICD-10-CM | POA: Diagnosis present

## 2016-03-24 DIAGNOSIS — Z9889 Other specified postprocedural states: Secondary | ICD-10-CM | POA: Diagnosis not present

## 2016-03-24 DIAGNOSIS — R45851 Suicidal ideations: Secondary | ICD-10-CM | POA: Diagnosis present

## 2016-03-24 DIAGNOSIS — G4700 Insomnia, unspecified: Secondary | ICD-10-CM | POA: Diagnosis present

## 2016-03-24 DIAGNOSIS — F14251 Cocaine dependence with cocaine-induced psychotic disorder with hallucinations: Secondary | ICD-10-CM | POA: Diagnosis present

## 2016-03-24 DIAGNOSIS — F1424 Cocaine dependence with cocaine-induced mood disorder: Secondary | ICD-10-CM | POA: Diagnosis present

## 2016-03-24 DIAGNOSIS — F332 Major depressive disorder, recurrent severe without psychotic features: Secondary | ICD-10-CM | POA: Diagnosis not present

## 2016-03-24 DIAGNOSIS — F1420 Cocaine dependence, uncomplicated: Secondary | ICD-10-CM | POA: Diagnosis not present

## 2016-03-24 DIAGNOSIS — F4310 Post-traumatic stress disorder, unspecified: Secondary | ICD-10-CM | POA: Clinically undetermined

## 2016-03-24 MED ORDER — MAGNESIUM HYDROXIDE 400 MG/5ML PO SUSP: 30.0000 mL | Freq: Every day | ORAL | Status: DC | PRN

## 2016-03-24 MED ORDER — TRAZODONE HCL 100 MG PO TABS
100.0000 mg | ORAL_TABLET | Freq: Every evening | ORAL | Status: DC | PRN
  Administered 2016-03-24: 100 mg via ORAL
  Filled 2016-03-24 (×5): qty 1

## 2016-03-24 MED ORDER — ALUM & MAG HYDROXIDE-SIMETH 200-200-20 MG/5ML PO SUSP: 30.0000 mL | ORAL | Status: DC | PRN

## 2016-03-24 MED ORDER — OLANZAPINE 2.5 MG PO TABS
2.5000 mg | ORAL_TABLET | Freq: Every day | ORAL | Status: DC
  Administered 2016-03-24: 2.5 mg via ORAL
  Filled 2016-03-24 (×3): qty 1

## 2016-03-24 MED ORDER — HYDROXYZINE HCL 25 MG PO TABS
25.0000 mg | ORAL_TABLET | Freq: Three times a day (TID) | ORAL | Status: DC | PRN
  Administered 2016-03-24 – 2016-03-26 (×4): 25 mg via ORAL
  Filled 2016-03-24 (×4): qty 1

## 2016-03-24 MED ORDER — ACETAMINOPHEN 325 MG PO TABS
650.0000 mg | ORAL_TABLET | Freq: Four times a day (QID) | ORAL | Status: DC | PRN
  Administered 2016-03-25 (×2): 650 mg via ORAL
  Filled 2016-03-24 (×2): qty 2

## 2016-03-24 MED ORDER — TRAZODONE HCL 100 MG PO TABS
100.0000 mg | ORAL_TABLET | Freq: Every evening | ORAL | Status: DC | PRN
  Administered 2016-03-24: 100 mg via ORAL
  Filled 2016-03-24: qty 1

## 2016-03-24 NOTE — Tx Team (Addendum)
Initial Treatment Plan 03/24/2016 3:52 PM Amy RankinJacquetta Le [id number]    PATIENT STRESSORS: Financial difficulties Marital or family conflict Substance abuse   PATIENT STRENGTHS: Average or above average intelligence General fund of knowledge   PATIENT IDENTIFIED PROBLEMS: Suicidal ideation  Anxiety Lack of housing    "I need to get back on my medications"                 DISCHARGE CRITERIA:  Reduction of life-threatening or endangering symptoms to within safe limits Verbal commitment to aftercare and medication compliance  PRELIMINARY DISCHARGE PLAN: Outpatient therapy Placement in alternative living arrangements  PATIENT/FAMILY INVOLVEMENT: This treatment plan has been presented to and reviewed with the patient, Amy RankinJacquetta Le.  The patient and family have been given the opportunity to ask questions and make suggestions.  Wynona LunaBeck, Suzanne K, RN 03/24/2016, 3:52 PM

## 2016-03-24 NOTE — ED Notes (Signed)
Regular Diet ordered for patient for Lunch.

## 2016-03-24 NOTE — Progress Notes (Signed)
Patient ID: Amy Le, female   DOB: 06/11/1975, 41 y.o.   MRN: [id number] Requested something for anxiety, requested Ativan which she says she takes at home several times a day, but none for three weeks, has been without any medications. Order for Vistaril and it was given as ordered.

## 2016-03-24 NOTE — ED Notes (Signed)
Patient was given a drink and pack of cookies. 
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
## 2016-03-24 NOTE — ED Notes (Signed)
Pt provided night time snack. 

## 2016-03-25 ENCOUNTER — Encounter (HOSPITAL_COMMUNITY): Payer: Self-pay | Admitting: Psychiatry

## 2016-03-25 DIAGNOSIS — F1420 Cocaine dependence, uncomplicated: Secondary | ICD-10-CM

## 2016-03-25 DIAGNOSIS — F332 Major depressive disorder, recurrent severe without psychotic features: Principal | ICD-10-CM | POA: Diagnosis present

## 2016-03-25 DIAGNOSIS — Z9889 Other specified postprocedural states: Secondary | ICD-10-CM

## 2016-03-25 DIAGNOSIS — Z79899 Other long term (current) drug therapy: Secondary | ICD-10-CM

## 2016-03-25 DIAGNOSIS — F4310 Post-traumatic stress disorder, unspecified: Secondary | ICD-10-CM

## 2016-03-25 DIAGNOSIS — Z9851 Tubal ligation status: Secondary | ICD-10-CM

## 2016-03-25 MED ORDER — GUAIFENESIN ER 600 MG PO TB12: 600.0000 mg | ORAL_TABLET | Freq: Two times a day (BID) | ORAL | Status: DC

## 2016-03-25 MED ORDER — ZOLPIDEM TARTRATE 5 MG PO TABS
5.0000 mg | ORAL_TABLET | Freq: Every day | ORAL | Status: DC
  Administered 2016-03-25: 5 mg via ORAL
  Filled 2016-03-25: qty 1

## 2016-03-25 MED ORDER — LURASIDONE HCL 40 MG PO TABS
40.0000 mg | ORAL_TABLET | Freq: Every day | ORAL | Status: DC
  Administered 2016-03-25 – 2016-03-26 (×2): 40 mg via ORAL
  Filled 2016-03-25 (×5): qty 1

## 2016-03-25 MED ORDER — GUAIFENESIN ER 600 MG PO TB12
600.0000 mg | ORAL_TABLET | Freq: Two times a day (BID) | ORAL | Status: DC | PRN
  Administered 2016-03-25: 600 mg via ORAL
  Filled 2016-03-25: qty 1

## 2016-03-25 MED ORDER — LORAZEPAM 1 MG PO TABS
1.0000 mg | ORAL_TABLET | Freq: Every day | ORAL | Status: DC | PRN
  Administered 2016-03-25 – 2016-03-26 (×2): 1 mg via ORAL
  Filled 2016-03-25 (×2): qty 1

## 2016-03-25 MED ORDER — OLANZAPINE 5 MG PO TBDP: 5.0000 mg | ORAL_TABLET | Freq: Three times a day (TID) | ORAL | Status: DC | PRN

## 2016-03-25 MED ORDER — NICOTINE 21 MG/24HR TD PT24
21.0000 mg | MEDICATED_PATCH | Freq: Every day | TRANSDERMAL | Status: DC
  Filled 2016-03-25 (×5): qty 1

## 2016-03-25 NOTE — Progress Notes (Signed)
Psychoeducational Group Note  Date:  03/25/2016 Time:  2100 Group Topic/Focus:  wrap up group  Participation Level: Did Not Attend  Participation Quality:  Not Applicable  Affect:  Not Applicable  Cognitive:  Not Applicable  Insight:  Not Applicable  Engagement in Group: Not Applicable  Additional Comments:  Pt reported flu like symptoms, remained in bed and later put on droplet precautions.Marcille BuffyMcNeil, Felice Hope S 03/25/2016, 11:29 PM

## 2016-03-25 NOTE — Progress Notes (Signed)
D:  Patient in bed; affect irritable; mood anxious/depressed; she denies suicidal and homicidal ideation; endorses auditory hallucinations but reports no visual hallucinatations; no self-injurous behaviiors noted or reported. A: Offered patient scheduled Nicotine patch but she refused;   Emotional support provided; encouraged her to seek assistance with needs/concerns. R:  Safety maintained on unit. Remains on q 15 minute safety checks

## 2016-03-25 NOTE — [facility] Suicide Risk Assessment (Signed)
[facility] INPATIENT:  Family/Significant Other Suicide Prevention Education  Suicide Prevention Education:  Patient Refusal for Family/Significant Other Suicide Prevention Education: The patient Amy RankinJacquetta Brearley has refused to provide written consent for family/significant other to be provided Family/Significant Other Suicide Prevention Education during admission and/or prior to discharge.  Physician notified.  SPE completed with pt, as pt refused to consent to family contact. SPI pamphlet provided to pt and pt was encouraged to share information with support network, ask questions, and talk about any concerns relating to SPE. Pt denies access to guns/firearms and verbalized understanding of information provided. Mobile Crisis information also provided to pt.   Astraea Gaughran N Smart LCSW 03/25/2016, 1:54 PM

## 2016-03-25 NOTE — Progress Notes (Signed)
At the beginning of the shift, pt was in her room in bed with eyes closed, but responded when writer spoke her name.  Pt first asked what time it was, then began asking about medications.  When told that she did not have any scheduled meds for the night, but probably had meds available for anxiety and sleep, pt wanted to know if she had Ambien and Ativan that she could take.  When writer checked her orders and found neither of these meds, pt became quite irritable and said that these were her home meds.  As noted in previous notes, pt states she has been off her meds more than a week.  Pt has been irritable the rest of the evening.  Writer spoke to the provider on the unit who reviewed her meds and did modify her Trazodone with a repeat, and also added a low dose of Zyprexa at bedtime.  Pt was agreeable and took the medications.  Pt reports having passive SI, but contracts for safety.  She denies HI/AVH.  She makes her needs known.  She also presents quite arrogant and entitled.  Support and encouragement offered.  Discharge plans are in process.  Safety maintained with q15 minute checks.
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
## 2016-03-25 NOTE — Plan of Care (Signed)
Problem: Self-Concept: Goal: Ability to disclose and discuss suicidal ideas will improve Outcome: Progressing Denies SI    

## 2016-03-25 NOTE — [facility] Group Notes (Signed)
[facility] LCSW Group Therapy  03/25/2016 1:30 PM  Type of Therapy:  Group Therapy  Participation Level:  Did Not Attend-pt invited. Chose to rest in room.   Summary of Progress/Problems: Emotion Regulation: This group focused on both positive and negative emotion identification and allowed group members to process ways to identify feelings, regulate negative emotions, and find healthy ways to manage internal/external emotions. Group members were asked to reflect on a time when their reaction to an emotion led to a negative outcome and explored how alternative responses using emotion regulation would have benefited them. Group members were also asked to discuss a time when emotion regulation was utilized when a negative emotion was experienced.   Stashia Sia N Smart LCSW 03/25/2016, 1:30 PM
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
## 2016-03-25 NOTE — Tx Team (Signed)
Interdisciplinary Treatment and Diagnostic Plan Update  03/25/2016 Time of Session: 9:30AM Amy RankinJacquetta Le MRN: [id number]  Principal Diagnosis: MDD (major depressive disorder), recurrent episode, severe (HCC)  Secondary Diagnoses: Principal Problem:   MDD (major depressive disorder), recurrent episode, severe (HCC) Active Problems:   Cocaine use disorder, severe, dependence (HCC)   PTSD (post-traumatic stress disorder)   Current Medications:  Current Facility-Administered Medications  Medication Dose Route Frequency Provider Last Rate Last Dose  . acetaminophen (TYLENOL) tablet 650 mg  650 mg Oral Q6H PRN Truman Haywardakia S Starkes, FNP   650 mg at 03/25/16 1037  . alum & mag hydroxide-simeth (MAALOX/MYLANTA) 200-200-20 MG/5ML suspension 30 mL  30 mL Oral Q4H PRN Truman Haywardakia S Starkes, FNP      . hydrOXYzine (ATARAX/VISTARIL) tablet 25 mg  25 mg Oral TID PRN Truman Haywardakia S Starkes, FNP   25 mg at 03/25/16 1037  . LORazepam (ATIVAN) tablet 1 mg  1 mg Oral Daily PRN Saramma Eappen, MD      . lurasidone (LATUDA) tablet 40 mg  40 mg Oral Q breakfast Saramma Eappen, MD      . magnesium hydroxide (MILK OF MAGNESIA) suspension 30 mL  30 mL Oral Daily PRN Truman Haywardakia S Starkes, FNP      . nicotine (NICODERM CQ - dosed in mg/24 hours) patch 21 mg  21 mg Transdermal Daily Spencer E Simon, PA-C      . OLANZapine zydis (ZYPREXA) disintegrating tablet 5 mg  5 mg Oral TID PRN Jomarie LongsSaramma Eappen, MD      . zolpidem (AMBIEN) tablet 5 mg  5 mg Oral QHS Jomarie LongsSaramma Eappen, MD       PTA Medications: Prescriptions Prior to Admission  Medication Sig Dispense Refill Last Dose  . hydrOXYzine (ATARAX/VISTARIL) 25 MG tablet Take 1 tablet (25 mg total) by mouth every 6 (six) hours as needed for anxiety. 60 tablet 0 Past Week at Unknown time  . ibuprofen (ADVIL,MOTRIN) 800 MG tablet Take 800 mg by mouth 2 (two) times daily as needed for pain.  0 Past Week at Unknown time  . LORazepam (ATIVAN) 0.5 MG tablet Take 1 tablet (0.5 mg total) by mouth 2  (two) times daily as needed (anxiety). 28 tablet 0 Past Week at Unknown time  . lurasidone (LATUDA) 40 MG TABS tablet Take 1 tablet (40 mg total) by mouth daily with breakfast. 30 tablet 0 Past Week at Unknown time  . sertraline (ZOLOFT) 50 MG tablet Take 3 tablets (150 mg total) by mouth daily. 90 tablet 0 Past Week at Unknown time  . zolpidem (AMBIEN) 5 MG tablet Take 1 tablet (5 mg total) by mouth at bedtime as needed for sleep. 30 tablet 0 Past Week at Unknown time    Patient Stressors: Financial difficulties Marital or family conflict Substance abuse  Patient Strengths: Average or above average intelligence General fund of knowledge  Treatment Modalities: Medication Management, Group therapy, Case management,  1 to 1 session with clinician, Psychoeducation, Recreational therapy.   Physician Treatment Plan for Primary Diagnosis: MDD (major depressive disorder), recurrent episode, severe (HCC) Long Term Goal(s): Improvement in symptoms so as ready for discharge Improvement in symptoms so as ready for discharge   Short Term Goals: Ability to identify changes in lifestyle to reduce recurrence of condition will improve Ability to verbalize feelings will improve Ability to disclose and discuss suicidal ideas Ability to demonstrate self-control will improve Ability to identify changes in lifestyle to reduce recurrence of condition will improve Ability to verbalize feelings will improve  Ability to identify and develop effective coping behaviors will improve Compliance with prescribed medications will improve Ability to identify triggers associated with substance abuse/mental health issues will improve  Medication Management: Evaluate patient's response, side effects, and tolerance of medication regimen.  Therapeutic Interventions: 1 to 1 sessions, Unit Group sessions and Medication administration.  Evaluation of Outcomes: Progressing  Physician Treatment Plan for Secondary  Diagnosis: Principal Problem:   MDD (major depressive disorder), recurrent episode, severe (HCC) Active Problems:   Cocaine use disorder, severe, dependence (HCC)   PTSD (post-traumatic stress disorder)  Long Term Goal(s): Improvement in symptoms so as ready for discharge Improvement in symptoms so as ready for discharge   Short Term Goals: Ability to identify changes in lifestyle to reduce recurrence of condition will improve Ability to verbalize feelings will improve Ability to disclose and discuss suicidal ideas Ability to demonstrate self-control will improve Ability to identify changes in lifestyle to reduce recurrence of condition will improve Ability to verbalize feelings will improve Ability to identify and develop effective coping behaviors will improve Compliance with prescribed medications will improve Ability to identify triggers associated with substance abuse/mental health issues will improve     Medication Management: Evaluate patient's response, side effects, and tolerance of medication regimen.  Therapeutic Interventions: 1 to 1 sessions, Unit Group sessions and Medication administration.  Evaluation of Outcomes: Progressing   RN Treatment Plan for Primary Diagnosis: MDD (major depressive disorder), recurrent episode, severe (HCC) Long Term Goal(s): Knowledge of disease and therapeutic regimen to maintain health will improve  Short Term Goals: Ability to remain free from injury will improve, Ability to verbalize feelings will improve and Ability to disclose and discuss suicidal ideas  Medication Management: RN will administer medications as ordered by provider, will assess and evaluate patient's response and provide education to patient for prescribed medication. RN will report any adverse and/or side effects to prescribing provider.  Therapeutic Interventions: 1 on 1 counseling sessions, Psychoeducation, Medication administration, Evaluate responses to treatment,  Monitor vital signs and CBGs as ordered, Perform/monitor CIWA, COWS, AIMS and Fall Risk screenings as ordered, Perform wound care treatments as ordered.  Evaluation of Outcomes: Progressing   LCSW Treatment Plan for Primary Diagnosis: MDD (major depressive disorder), recurrent episode, severe (HCC) Long Term Goal(s): Safe transition to appropriate next level of care at discharge, Engage patient in therapeutic group addressing interpersonal concerns.  Short Term Goals: Engage patient in aftercare planning with referrals and resources, Facilitate patient progression through stages of change regarding substance use diagnoses and concerns and Identify triggers associated with mental health/substance abuse issues  Therapeutic Interventions: Assess for all discharge needs, 1 to 1 time with Social worker, Explore available resources and support systems, Assess for adequacy in community support network, Educate family and significant other(s) on suicide prevention, Complete Psychosocial Assessment, Interpersonal group therapy.  Evaluation of Outcomes: Progressing   Progress in Treatment: Attending groups: No. New to unit. Continuing to assess.  Participating in groups: No. Taking medication as prescribed: Yes. Toleration medication: Yes. Family/Significant other contact made: No, will contact:  family member if patient consents Patient understands diagnosis: Yes. Discussing patient identified problems/goals with staff: Yes. Medical problems stabilized or resolved: Yes. Denies suicidal/homicidal ideation: No. Passive SI/able to contract for safety on the unit.  Issues/concerns per patient self-inventory: No.  Other: n/a  New problem(s) identified: No, Describe:  n/a  New Short Term/Long Term Goal(s):  Discharge Plan or Barriers: CSW assessing. Pt last admission 03/09/16 and 09/29/15 for similar issues. She was referred  to [place].   Reason for Continuation of Hospitalization:  Depression Medication stabilization Withdrawal symptoms Other; describe agitation  Estimated Length of Stay: 3-5 days   Attendees: Patient: 03/25/2016 12:27 PM  Physician: Dr. Jama Flavors MD; Dr. Elna Breslow MD 03/25/2016 12:27 PM  Nursing: Maurice March RN 03/25/2016 12:27 PM  RN Care Manager:  Onnie Boer CM 03/25/2016 12:27 PM  Social Worker: Trula Slade, LCSW; Mordecai Rasmussen LCSW 03/25/2016 12:27 PM  Recreational Therapist:  03/25/2016 12:27 PM  Other: Armandina Stammer NP; Gray Bernhardt NP 03/25/2016 12:27 PM  Other:  03/25/2016 12:27 PM  Other: 03/25/2016 12:27 PM    Scribe for Treatment Team: Ledell Peoples Smart, LCSW 03/25/2016 12:27 PM
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
## 2016-03-26 DIAGNOSIS — F332 Major depressive disorder, recurrent severe without psychotic features: Secondary | ICD-10-CM

## 2016-03-26 LAB — RESPIRATORY PANEL BY PCR
ADENOVIRUS-RVPPCR: NOT DETECTED
Bordetella pertussis: NOT DETECTED
CHLAMYDOPHILA PNEUMONIAE-RVPPCR: NOT DETECTED
CORONAVIRUS NL63-RVPPCR: NOT DETECTED
CORONAVIRUS OC43-RVPPCR: NOT DETECTED
Coronavirus 229E: NOT DETECTED
Coronavirus HKU1: DETECTED — AB
INFLUENZA A-RVPPCR: NOT DETECTED
Influenza B: NOT DETECTED
MYCOPLASMA PNEUMONIAE-RVPPCR: NOT DETECTED
Metapneumovirus: NOT DETECTED
PARAINFLUENZA VIRUS 1-RVPPCR: NOT DETECTED
PARAINFLUENZA VIRUS 3-RVPPCR: NOT DETECTED
PARAINFLUENZA VIRUS 4-RVPPCR: NOT DETECTED
Parainfluenza Virus 2: NOT DETECTED
RHINOVIRUS / ENTEROVIRUS - RVPPCR: NOT DETECTED
Respiratory Syncytial Virus: NOT DETECTED

## 2016-03-26 LAB — LIPID PANEL
CHOLESTEROL: 151 mg/dL (ref 0–200)
HDL: 40 mg/dL — ABNORMAL LOW (ref >40)
LDL Cholesterol: 81 mg/dL (ref 0–99)
Total CHOL/HDL Ratio: 3.8 RATIO
Triglycerides: 149 mg/dL (ref <150)
VLDL: 30 mg/dL (ref 0–40)

## 2016-03-26 LAB — TSH: TSH: 0.318 u[IU]/mL — AB (ref 0.350–4.500)

## 2016-03-26 MED ORDER — SERTRALINE HCL 50 MG PO TABS: 50.0000 mg | ORAL_TABLET | Freq: Every day | ORAL | 0 refills | Status: DC

## 2016-03-26 MED ORDER — LURASIDONE HCL 40 MG PO TABS: 40.0000 mg | ORAL_TABLET | Freq: Every day | ORAL | 0 refills | Status: DC

## 2016-03-26 MED ORDER — SERTRALINE HCL 50 MG PO TABS
50.0000 mg | ORAL_TABLET | Freq: Every day | ORAL | Status: DC
  Administered 2016-03-26: 50 mg via ORAL
  Filled 2016-03-26 (×4): qty 1

## 2016-03-26 NOTE — [facility] Suicide Risk Assessment (Addendum)
[facility]   Principal Problem: MDD (major depressive disorder), recurrent episode, severe (HCC) Discharge Diagnoses:  Patient Active Problem List   Diagnosis Date Noted  . MDD (major depressive disorder), recurrent episode, severe (HCC) [F33.2] 03/25/2016  . PTSD (post-traumatic stress disorder) [F43.10] 03/25/2016  . Cocaine use disorder, severe, dependence (HCC) [F14.20] 10/07/2015    Total Time spent with patient: 30 minutes  Musculoskeletal: Strength & Muscle Tone: within normal limits Gait & Station: normal Patient leans: N/A  Psychiatric Specialty Exam: Review of Systems  Psychiatric/Behavioral: Positive for depression.  All other systems reviewed and are negative.   Blood pressure 113/66, pulse (!) 121, temperature 99.2 F (37.3 C), temperature source Oral, resp. rate 16, height 5\' 7"  (1.702 m), weight 119.3 kg (263 lb), last menstrual period 03/03/2016, SpO2 100 %.Body mass index is 41.19 kg/m.  General Appearance: Casual  Eye Contact::  Fair  Speech:  Clear and Coherent409  Volume:  Normal  Mood:  Depressed  Affect:  Appropriate  Thought Process:  Goal Directed and Descriptions of Associations: Intact  Orientation:  Full (Time, Place, and Person)  Thought Content:  Logical  Suicidal Thoughts:  No  Homicidal Thoughts:  No  Memory:  Immediate;   Fair Recent;   Fair Remote;   Fair  Judgement:  Fair  Insight:  Fair  Psychomotor Activity:  Normal  Concentration:  Fair  Recall:  [employer]Fair  Fund of Knowledge:Fair  Language: Fair  Akathisia:  No  Handed:  Right  AIMS (if indicated):     Assets:  Communication Skills Desire for Improvement  Sleep:  Number of Hours: 2.75  Cognition: WNL  ADL's:  Intact   Mental Status Per Nursing Assessment::   On Admission:  Suicidal ideation indicated by patient  Demographic Factors:  NA  Loss Factors: NA  Historical Factors: Impulsivity  Risk Reduction Factors:   Positive therapeutic  relationship  Continued Clinical Symptoms:  Alcohol/Substance Abuse/Dependencies Previous Psychiatric Diagnoses and Treatments  Cognitive Features That Contribute To Risk:  None    Suicide Risk:  Minimal: No identifiable suicidal ideation.  Patients presenting with no risk factors but with morbid ruminations; may be classified as minimal risk based on the severity of the depressive symptoms  Follow-[facility] Information    [facility]   [facility]  [facility] Contact information: [address] [place] [address] [phone]           Plan Of Care/Follow-up recommendations: Patient after writer spoke with her this AM - conveyed to staff that she feels her flu sx are refraining her from getting involved in the program here and wants to be discharged. Pt offered Out patient referral. Activity:  no restrictions Diet:  regular, stay hydrated Tests:  pending PCR viral panel, rapid strep test Other:  Patient advised to walk in to an urgent care or ED IF HER FLU SX worsens. Pt reports she will contact us to get her pending lab results and does not have phone number that she can give.  Victorian Gunn, MD 03/26/2016, 2:00 PM

## 2016-03-26 NOTE — Progress Notes (Signed)
  [facility] :  Will you be returning to the same living situation after discharge:  Yes,  home At discharge, do you have transportation home?: Yes,  Reggie from [facility] will take her home at 2:30PM Do you have the ability to pay for your medications: Yes,  [facility]  Release of information consent forms completed and submitted to medical records by CSW.  Patient to Follow up at: Follow-up Information    [facility].   Specialty:  [facility] Contact information: [address] [place] [address] [phone]           Next level of care provider [facility] Link:no  Safety Planning and Suicide Prevention discussed: Yes,  SPE completed with pt; pt declined to consent for family contact.]  Have you used any form of tobacco in the last 30 days? (Cigarettes, Smokeless Tobacco, Cigars, and/or Pipes): Yes  Has patient been referred to the Quitline?: Patient refused referral  Patient has been referred for addiction treatment: Yes  Narely Nobles N Smart LCSW 03/26/2016, 2:03 PM

## 2016-03-26 NOTE — Progress Notes (Signed)
Recreation Therapy Notes  Date: 03/26/16 Time: 0930 Location: [address] Dayroom  Group Topic: Stress Management  Goal Area(s) Addresses:  Patient will verbalize importance of using healthy stress management.  Patient will identify positive emotions associated with healthy stress management.   Intervention: Stress Management  Activity :  Choice Meditation.  LRT introduced the stress management technique of meditation.  LRT played a meditation focused on choices to allow patients the opportunity to engage in the activity.  Patients were to follow along with the meditation as it played.  Education:  Stress Management, Discharge Planning.   Education Outcome: Acknowledges edcuation/In group clarification offered/Needs additional education  Clinical Observations/Feedback: Pt did not attend group.    Lasaundra Riche, LRT/CTRS         Gabriana Wilmott A 03/26/2016 12:44 PM 

## 2016-03-26 NOTE — Progress Notes (Signed)
[facility] MD Progress Note  03/26/2016 12:22 PM Amy Le  MRN:  [id number] Subjective:  Patient states " I am hopeless and depressed. I also do not feel good since I have the flu or something.'  Objective:Patient seen and chart reviewed.Discussed patient with treatment team.  Patient today is seen as sad, hopeless , states she feels sick since she has the flu. Pt reports she has not been able to attend any groups since she is on droplet precaution at this time. Discussed further tests and encouraged to take medications. No medications changes will be made today since she was feverish . Will continue to use supportive measures for flu like sx.    Principal Problem: MDD (major depressive disorder), recurrent episode, severe (HCC) Diagnosis:   Patient Active Problem List   Diagnosis Date Noted  . MDD (major depressive disorder), recurrent episode, severe (HCC) [F33.2] 03/25/2016  . PTSD (post-traumatic stress disorder) [F43.10] 03/25/2016  . Cocaine use disorder, severe, dependence (HCC) [F14.20] 10/07/2015   Total Time spent with patient: 25 minutes  Past Psychiatric History: Please see H&P.   Past Medical History:  Past Medical History:  Diagnosis Date  . Bipolar 1 disorder (HCC)   . BV (bacterial vaginosis)   . Chlamydia   . Trichomonas     Past Surgical History:  Procedure Laterality Date  . FOOT SURGERY    . MULTIPLE TOOTH EXTRACTIONS Bilateral 2017  . TUBAL LIGATION     Family History:  Family History  Problem Relation Age of Onset  . Mental illness Neg Hx    Family Psychiatric  History: Please see H&P.  Social History: Please see H&P.  History  Alcohol Use No     History  Drug Use No    Social History   Social History  . Marital status: Divorced    Spouse name: N/A  . Number of children: N/A  . Years of education: N/A   Social History Main Topics  . Smoking status: Current Every Day Smoker    Packs/day: 0.50    Types: Cigarettes  . Smokeless  tobacco: Never Used  . Alcohol use No  . Drug use: No  . Sexual activity: Yes    Birth control/ protection: Surgical   Other Topics Concern  . None   Social History Narrative  . None   Additional Social History:    Pain Medications: denies abuse Prescriptions: denies abuse Over the Counter: denies abuse History of alcohol / drug use?: Yes Longest period of sobriety (when/how long): 6 years Name of Substance 1: cocaine 1 - Age of First Use: 20's 1 - Amount (size/oz): varies  1 - Frequency: 2-3 times per week 1 - Duration:  on-going  1 - Last Use / Amount: 03/08/2016 Name of Substance 2: UDS positive for Barbituates...patient sts, "I don't know that got in my system" 2 - Age of First Use: unk 2 - Amount (size/oz): unk 2 - Frequency: unk 2 - Duration: unk 2 - Last Use / Amount: unk                Sleep: Fair  Appetite:  Poor  Current Medications: Current Facility-Administered Medications  Medication Dose Route Frequency Provider Last Rate Last Dose  . acetaminophen (TYLENOL) tablet 650 mg  650 mg Oral Q6H PRN Truman Hayward, FNP   650 mg at 03/25/16 2049  . alum & mag hydroxide-simeth (MAALOX/MYLANTA) 200-200-20 MG/5ML suspension 30 mL  30 mL Oral Q4H PRN Truman Hayward, FNP      .  guaiFENesin (MUCINEX) 12 hr tablet 600 mg  600 mg Oral BID PRN Jackelyn PolingJason A Berry, NP   600 mg at 03/25/16 2147  . hydrOXYzine (ATARAX/VISTARIL) tablet 25 mg  25 mg Oral TID PRN Truman Haywardakia S Starkes, FNP   25 mg at 03/26/16 0426  . LORazepam (ATIVAN) tablet 1 mg  1 mg Oral Daily PRN Jomarie LongsSaramma Brenlynn Fake, MD   1 mg at 03/26/16 1100  . lurasidone (LATUDA) tablet 40 mg  40 mg Oral Q breakfast Jomarie LongsSaramma Yitta Gongaware, MD   40 mg at 03/26/16 0901  . magnesium hydroxide (MILK OF MAGNESIA) suspension 30 mL  30 mL Oral Daily PRN Truman Haywardakia S Starkes, FNP      . nicotine (NICODERM CQ - dosed in mg/24 hours) patch 21 mg  21 mg Transdermal Daily Spencer E Simon, PA-C      . OLANZapine zydis (ZYPREXA) disintegrating tablet 5 mg   5 mg Oral TID PRN Jomarie LongsSaramma Oshua Mcconaha, MD      . sertraline (ZOLOFT) tablet 50 mg  50 mg Oral Daily Kashaun Bebo, MD      . zolpidem (AMBIEN) tablet 5 mg  5 mg Oral QHS Jomarie LongsSaramma Aradia Estey, MD   5 mg at 03/25/16 2148    Lab Results:  Results for orders placed or performed during the hospital encounter of 03/24/16 (from the past 48 hour(s))  Lipid panel     Status: Abnormal   Collection Time: 03/26/16  6:29 AM  Result Value Ref Range   Cholesterol 151 0 - 200 mg/dL   Triglycerides 478149 <295<150 mg/dL   HDL 40 (L) >62>40 mg/dL   Total CHOL/HDL Ratio 3.8 RATIO   VLDL 30 0 - 40 mg/dL   LDL Cholesterol 81 0 - 99 mg/dL    Comment:        Total Cholesterol/HDL:CHD Risk Coronary Heart Disease Risk Table                     Men   Women  1/2 Average Risk   3.4   3.3  Average Risk       5.0   4.4  2 X Average Risk   9.6   7.1  3 X Average Risk  23.4   11.0        Use the calculated Patient Ratio above and the CHD Risk Table to determine the patient's CHD Risk.        ATP III CLASSIFICATION (LDL):  <100     mg/dL   Optimal  [id number]  mg/dL   Near or Above                    Optimal  130-159  mg/dL   Borderline  [id number]  mg/dL   High  >295>190     mg/dL   Very High Performed at [facility]   TSH     Status: Abnormal   Collection Time: 03/26/16  6:29 AM  Result Value Ref Range   TSH 0.318 (L) 0.350 - 4.500 uIU/mL    Comment: Performed by a 3rd Generation assay with a functional sensitivity of <=0.01 uIU/mL. Performed at [facility]     Blood Alcohol level:  Lab Results  Component Value Date   [facility]   ETH <5 03/09/2016    Metabolic Disorder Labs: No results found for: HGBA1C, MPG No results found for: PROLACTIN Lab Results  Component Value Date   CHOL 151 03/26/2016   TRIG 149 03/26/2016  HDL 40 (L) 03/26/2016   CHOLHDL 3.8 03/26/2016   VLDL 30 03/26/2016   LDLCALC 81 03/26/2016    Physical Findings: AIMS: Facial and Oral Movements Muscles of  Facial Expression: None, normal Lips and Perioral Area: None, normal Jaw: None, normal Tongue: None, normal,Extremity Movements Upper (arms, wrists, hands, fingers): None, normal Lower (legs, knees, ankles, toes): None, normal, Trunk Movements Neck, shoulders, hips: None, normal, Overall Severity Severity of abnormal movements (highest score from questions above): None, normal Incapacitation due to abnormal movements: None, normal Patient's awareness of abnormal movements (rate only patient's report): No Awareness, Dental Status Current problems with teeth and/or dentures?: No Does patient usually wear dentures?: No  CIWA:    COWS:     Musculoskeletal: Strength & Muscle Tone: within normal limits Gait & Station: normal Patient leans: N/A  Psychiatric Specialty Exam: Physical Exam  Nursing note and vitals reviewed.   Review of Systems  Musculoskeletal: Positive for myalgias.  Psychiatric/Behavioral: Positive for depression and substance abuse. The patient is nervous/anxious.   All other systems reviewed and are negative.   Blood pressure 113/66, pulse (!) 121, temperature 99.2 F (37.3 C), temperature source Oral, resp. rate 16, height 5\' 7"  (1.702 m), weight 119.3 kg (263 lb), last menstrual period 03/03/2016, SpO2 100 %.Body mass index is 41.19 kg/m.  General Appearance: Guarded  Eye Contact:  Minimal  Speech:  Slow  Volume:  Decreased  Mood:  Anxious and Dysphoric  Affect:  Depressed  Thought Process:  Goal Directed and Descriptions of Associations: Circumstantial  Orientation:  Full (Time, Place, and Person)  Thought Content:  Rumination  Suicidal Thoughts:  No  Homicidal Thoughts:  No  Memory:  Immediate;   Fair Recent;   Fair Remote;   Fair  Judgement:  Impaired  Insight:  Shallow  Psychomotor Activity:  Decreased  Concentration:  Concentration: Fair and Attention Span: Fair  Recall:  [employer] of Knowledge:  Fair  Language:  Fair  Akathisia:  No  Handed:   Right  AIMS (if indicated):     Assets:  Desire for Improvement  ADL's:  Intact  Cognition:  WNL  Sleep:  Number of Hours: 2.75     Treatment Plan Summary: Patient today on droplet precaution due to flu like sx - pending blood work up, pt remains isolative , depressed - continue to encourage and treat.  MDD (major depressive disorder), recurrent episode, severe (HCC) unstable  Will continue today 03/26/16 plan as below except where it is noted.   Daily contact with patient to assess and evaluate symptoms and progress in treatment and Medication management   For depressive sx: Restart Zoloft 50 mg po daily. Latuda 40 mg po with meals.  For insomnia: Ambien 5 mg po qhs.  For anxiety/agitation PRN medications as per unit protocol.  For flu like sx: Tylenol 650 mg po prn. Guaifenasin xr 600 mg po bid . Pending PCR viral panel, rapid strep test. Chest xray - unremarkable.  Reviewed past medical records,treatment plan.   Will continue to monitor vitals ,medication compliance and treatment side effects while patient is here.   Will monitor for medical issues as well as call consult as needed.   Reviewed labs - tsh - low - will get t3, t4 , get rapid strep test.  CSW will continue working on disposition.   Patient to participate in therapeutic milieu .      Palmer Fahrner, MD 03/26/2016, 12:22 PM

## 2016-03-26 NOTE — Progress Notes (Signed)
Patient ID: Amy RankinJacquetta Gabrielsen, female   DOB: April 25, 1975, 41 y.o.   MRN: [id number]   Pt currently presents with a flat affect and depressed behavior. Pt reports to writer that their goal is to "feel better." Pt states "I feel like I have a cold and I am going to throw up." Pt reports lethargy and irritability. Pt reports good sleep with current medication regimen. Pt has concerns she has the flu. Frequently asks for snacks as she chose to remain in bed for dinner tonight.   Pt provided with medications per providers orders. Pt's labs and vitals were monitored throughout the night. Pt supported emotionally and encouraged to express concerns and questions. Pt educated on medications. Nasopharyngeal swab collected. Droplet precautions initiated pending the result of the swab, pt educated. No roommate for the time being.   Pt's safety ensured with 15 minute and environmental checks. Pt currently denies SI/HI and A/V hallucinations. Pt verbally agrees to seek staff if SI/HI or A/VH occurs and to consult with staff before acting on any harmful thoughts. Pt verbally agrees to follow droplet precautions, verbalizes understanding of need. Will continue POC.

## 2016-03-26 NOTE — Progress Notes (Signed)
Data. Patient denies SI/HI/AVH. Patient continues on droplet precautions for possible flu. Results of labs still pending. Patient has been staying in her room most of shift. Does come out of room at times. Patient requires frequent redirection to keep her mask up over her nose. Affect is flat and irritable. Patient refused to allow the ordered strep test to be completed, "I don't want to do that right now. I might do it later". Action. Emotional support and encouragement offered. Education provided on medication, indications and side effect. Q 15 minute checks done for safety. Response. Safety on the unit maintained through 15 minute checks.  Medications taken as prescribed.  Remained calm though irritable through out shift.  Pt. discharged to lobby.  Belongings sheet reviewed and signed by pt. and all belongings, including scripts, sent home. Paperwork reviewed and pt. able to verbalize understanding of education. Pt. in no current distress and ambulatory.

## 2016-03-26 NOTE — Tx Team (Signed)
Interdisciplinary Treatment and Diagnostic Plan Update  03/26/2016 Time of Session: 9:30AM Amy Le MRN: [id number]  Principal Diagnosis: MDD (major depressive disorder), recurrent episode, severe ([place])  Secondary Diagnoses: Principal Problem:   MDD (major depressive disorder), recurrent episode, severe ([place]) Active Problems:   Cocaine use disorder, severe, dependence ([place])   PTSD (post-traumatic stress disorder)   Severe episode of recurrent major depressive disorder, without psychotic features ([place])   Current Medications:  Current Facility-Administered Medications  Medication Dose Route Frequency Provider Last Rate Last Dose  . acetaminophen (TYLENOL) tablet 650 mg  650 mg Oral Q6H PRN Nanci Pina, FNP   650 mg at 03/25/16 2049  . alum & mag hydroxide-simeth (MAALOX/MYLANTA) 200-200-20 MG/5ML suspension 30 mL  30 mL Oral Q4H PRN Nanci Pina, FNP      . guaiFENesin (MUCINEX) 12 hr tablet 600 mg  600 mg Oral BID PRN Rozetta Nunnery, NP   600 mg at 03/25/16 2147  . hydrOXYzine (ATARAX/VISTARIL) tablet 25 mg  25 mg Oral TID PRN Nanci Pina, FNP   25 mg at 03/26/16 0426  . LORazepam (ATIVAN) tablet 1 mg  1 mg Oral Daily PRN Ursula Alert, MD   1 mg at 03/26/16 1100  . lurasidone (LATUDA) tablet 40 mg  40 mg Oral Q breakfast Ursula Alert, MD   40 mg at 03/26/16 0901  . magnesium hydroxide (MILK OF MAGNESIA) suspension 30 mL  30 mL Oral Daily PRN Nanci Pina, FNP      . nicotine (NICODERM CQ - dosed in mg/24 hours) patch 21 mg  21 mg Transdermal Daily Spencer E Simon, PA-C      . OLANZapine zydis (ZYPREXA) disintegrating tablet 5 mg  5 mg Oral TID PRN Ursula Alert, MD      . sertraline (ZOLOFT) tablet 50 mg  50 mg Oral Daily Saramma Eappen, MD      . zolpidem (AMBIEN) tablet 5 mg  5 mg Oral QHS Ursula Alert, MD   5 mg at 03/25/16 2148   PTA Medications: Prescriptions Prior to Admission  Medication Sig Dispense Refill Last Dose  . hydrOXYzine (ATARAX/VISTARIL)  25 MG tablet Take 1 tablet (25 mg total) by mouth every 6 (six) hours as needed for anxiety. 60 tablet 0 Past Week at Unknown time  . ibuprofen (ADVIL,MOTRIN) 800 MG tablet Take 800 mg by mouth 2 (two) times daily as needed for pain.  0 Past Week at Unknown time  . LORazepam (ATIVAN) 0.5 MG tablet Take 1 tablet (0.5 mg total) by mouth 2 (two) times daily as needed (anxiety). 28 tablet 0 Past Week at Unknown time  . lurasidone (LATUDA) 40 MG TABS tablet Take 1 tablet (40 mg total) by mouth daily with breakfast. 30 tablet 0 Past Week at Unknown time  . sertraline (ZOLOFT) 50 MG tablet Take 3 tablets (150 mg total) by mouth daily. 90 tablet 0 Past Week at Unknown time  . zolpidem (AMBIEN) 5 MG tablet Take 1 tablet (5 mg total) by mouth at bedtime as needed for sleep. 30 tablet 0 Past Week at Unknown time    Patient Stressors: Financial difficulties Marital or family conflict Substance abuse  Patient Strengths: Average or above average intelligence General fund of knowledge  Treatment Modalities: Medication Management, Group therapy, Case management,  1 to 1 session with clinician, Psychoeducation, Recreational therapy.   Physician Treatment Plan for Primary Diagnosis: MDD (major depressive disorder), recurrent episode, severe ([place]) Long Term Goal(s): Improvement in symptoms so as  ready for discharge Improvement in symptoms so as ready for discharge   Short Term Goals: Ability to identify changes in lifestyle to reduce recurrence of condition will improve Ability to verbalize feelings will improve Ability to disclose and discuss suicidal ideas Ability to demonstrate self-control will improve Ability to identify changes in lifestyle to reduce recurrence of condition will improve Ability to verbalize feelings will improve Ability to identify and develop effective coping behaviors will improve Compliance with prescribed medications will improve Ability to identify triggers associated with  substance abuse/mental health issues will improve  Medication Management: Evaluate patient's response, side effects, and tolerance of medication regimen.  Therapeutic Interventions: 1 to 1 sessions, Unit Group sessions and Medication administration.  Evaluation of Outcomes: Adequate for discharge  Physician Treatment Plan for Secondary Diagnosis: Principal Problem:   MDD (major depressive disorder), recurrent episode, severe ([place]) Active Problems:   Cocaine use disorder, severe, dependence (HCC)   PTSD (post-traumatic stress disorder)   Severe episode of recurrent major depressive disorder, without psychotic features ([place])  Long Term Goal(s): Improvement in symptoms so as ready for discharge Improvement in symptoms so as ready for discharge   Short Term Goals: Ability to identify changes in lifestyle to reduce recurrence of condition will improve Ability to verbalize feelings will improve Ability to disclose and discuss suicidal ideas Ability to demonstrate self-control will improve Ability to identify changes in lifestyle to reduce recurrence of condition will improve Ability to verbalize feelings will improve Ability to identify and develop effective coping behaviors will improve Compliance with prescribed medications will improve Ability to identify triggers associated with substance abuse/mental health issues will improve     Medication Management: Evaluate patient's response, side effects, and tolerance of medication regimen.  Therapeutic Interventions: 1 to 1 sessions, Unit Group sessions and Medication administration.  Evaluation of Outcomes: Met   RN Treatment Plan for Primary Diagnosis: MDD (major depressive disorder), recurrent episode, severe ([place]) Long Term Goal(s): Knowledge of disease and therapeutic regimen to maintain health will improve  Short Term Goals: Ability to remain free from injury will improve, Ability to verbalize feelings will improve and Ability to  disclose and discuss suicidal ideas  Medication Management: RN will administer medications as ordered by provider, will assess and evaluate patient's response and provide education to patient for prescribed medication. RN will report any adverse and/or side effects to prescribing provider.  Therapeutic Interventions: 1 on 1 counseling sessions, Psychoeducation, Medication administration, Evaluate responses to treatment, Monitor vital signs and CBGs as ordered, Perform/monitor CIWA, COWS, AIMS and Fall Risk screenings as ordered, Perform wound care treatments as ordered.  Evaluation of Outcomes: Met   LCSW Treatment Plan for Primary Diagnosis: MDD (major depressive disorder), recurrent episode, severe ([place]) Long Term Goal(s): Safe transition to appropriate next level of care at discharge, Engage patient in therapeutic group addressing interpersonal concerns.  Short Term Goals: Engage patient in aftercare planning with referrals and resources, Facilitate patient progression through stages of change regarding substance use diagnoses and concerns and Identify triggers associated with mental health/substance abuse issues  Therapeutic Interventions: Assess for all discharge needs, 1 to 1 time with Social worker, Explore available resources and support systems, Assess for adequacy in community support network, Educate family and significant other(s) on suicide prevention, Complete Psychosocial Assessment, Interpersonal group therapy.  Evaluation of Outcomes: Met   Progress in Treatment: Attending groups: Yes Participating in groups: Yes Taking medication as prescribed: Yes. Toleration medication: Yes. Family/Significant other contact made: SPE completed with pt; pt declined  to consent to family contact. Patient understands diagnosis: Yes. Discussing patient identified problems/goals with staff: Yes. Medical problems stabilized or resolved: Yes. Denies suicidal/homicidal ideation: Yes, per self  report. Issues/concerns per patient self-inventory: No.  Other: n/a  New problem(s) identified: Pt has flu symptoms and is supposed to be staying in her room. Pt requesting to discharge home due to inability to program on unit.  New Short Term/Long Term Goal(s): medication stabilization; detox; development of comprehensive mental wellness/sobriety plan.  Discharge Plan or Barriers: CSW assessing. Pt last admission 03/09/16 and 09/29/15 for similar issues. She was referred to [facility].   Reason for Continuation of Hospitalization: none  Estimated Length of Stay: d/c today   Attendees: Patient: 03/26/2016 2:05 PM  Physician: Dr. Parke Poisson MD; Dr. Shea Evans MD 03/26/2016 2:05 PM  Nursing: Precious Gilding RN 03/26/2016 2:05 PM  RN Care Manager:  Lars Pinks CM 03/26/2016 2:05 PM  Social Worker: Nira Conn Smart, LCSW; Peri Maris LCSW 03/26/2016 2:05 PM  Recreational Therapist:  03/26/2016 2:05 PM  Other: Lindell Spar NP;  03/26/2016 2:05 PM  Other:  03/26/2016 2:05 PM  Other: 03/26/2016 2:05 PM    Scribe for Treatment Team: [place], LCSW 03/26/2016 2:05 PM

## 2016-03-27 LAB — HEMOGLOBIN A1C
HEMOGLOBIN A1C: 5.6 % (ref 4.8–5.6)
Mean Plasma Glucose: 114 mg/dL

## 2016-03-27 LAB — PROLACTIN: Prolactin: 5.8 ng/mL (ref 4.8–23.3)

## 2016-03-27 NOTE — Discharge Summary (Signed)
Amy Le was admitted for MDD (major depressive disorder), recurrent episode, severe (HCC) and crisis management.  She was treated with the following medications Hydroxyzine, Latuda, Zoloft  .  Amy Le was discharged with current medication and was instructed on how to take medications as prescribed; (details listed below under Medication List).  Medical problems were identified and treated as needed.  Home medications were restarted as appropriate.  Improvement was monitored by observation and Amy Le daily report of symptom reduction.  Emotional and mental status was monitored by daily self-inventory reports completed by Amy Le and clinical staff.         Amy Le was evaluated by the treatment team for stability and plans for continued recovery upon discharge.  Amy Le motivation was an integral factor for scheduling further treatment.  Employment, transportation, bed availability, health status, family support, and any pending legal issues were also considered during her hospital stay.  She was offered further treatment options upon discharge including but not limited to Residential, Intensive Outpatient, and Outpatient treatment.  Amy Le will follow up with the services as listed below under Follow Up Information.     Upon completion of this admission the Amy Le was both mentally and medically stable for discharge denying suicidal/homicidal ideation, auditory/visual/tactile hallucinations, delusional thoughts and paranoia.     Please see SRA from MD for MMSE and care plan. Patient not assessed prior to discharge orders placed.

## 2016-04-04 ENCOUNTER — Emergency Department (HOSPITAL_COMMUNITY): Payer: Medicaid Other

## 2016-04-04 ENCOUNTER — Inpatient Hospital Stay (HOSPITAL_COMMUNITY)
Admission: AD | Admit: 2016-04-04 | Discharge: 2016-04-09 | DRG: 885 | Payer: Medicaid Other | Source: Intra-hospital | Attending: Psychiatry | Admitting: Psychiatry

## 2016-04-04 ENCOUNTER — Emergency Department (HOSPITAL_COMMUNITY)
Admission: EM | Admit: 2016-04-04 | Discharge: 2016-04-04 | Disposition: A | Discharge status: Other Acute Inpatient Hospital | Payer: Medicaid Other | Attending: Emergency Medicine | Admitting: Emergency Medicine

## 2016-04-04 ENCOUNTER — Encounter (HOSPITAL_COMMUNITY): Payer: Self-pay | Admitting: Emergency Medicine

## 2016-04-04 ENCOUNTER — Encounter (HOSPITAL_COMMUNITY): Payer: Self-pay | Admitting: *Deleted

## 2016-04-04 DIAGNOSIS — Z79899 Other long term (current) drug therapy: Secondary | ICD-10-CM

## 2016-04-04 DIAGNOSIS — R45851 Suicidal ideations: Secondary | ICD-10-CM | POA: Insufficient documentation

## 2016-04-04 DIAGNOSIS — Z915 Personal history of self-harm: Secondary | ICD-10-CM

## 2016-04-04 DIAGNOSIS — F319 Bipolar disorder, unspecified: Secondary | ICD-10-CM | POA: Diagnosis present

## 2016-04-04 DIAGNOSIS — F411 Generalized anxiety disorder: Secondary | ICD-10-CM | POA: Diagnosis present

## 2016-04-04 DIAGNOSIS — F332 Major depressive disorder, recurrent severe without psychotic features: Secondary | ICD-10-CM | POA: Diagnosis present

## 2016-04-04 DIAGNOSIS — F17210 Nicotine dependence, cigarettes, uncomplicated: Secondary | ICD-10-CM | POA: Diagnosis not present

## 2016-04-04 DIAGNOSIS — R0789 Other chest pain: Secondary | ICD-10-CM | POA: Diagnosis present

## 2016-04-04 DIAGNOSIS — Z9114 Patient's other noncompliance with medication regimen: Secondary | ICD-10-CM | POA: Diagnosis not present

## 2016-04-04 DIAGNOSIS — F4310 Post-traumatic stress disorder, unspecified: Secondary | ICD-10-CM | POA: Diagnosis present

## 2016-04-04 DIAGNOSIS — F14251 Cocaine dependence with cocaine-induced psychotic disorder with hallucinations: Secondary | ICD-10-CM | POA: Diagnosis present

## 2016-04-04 DIAGNOSIS — G4700 Insomnia, unspecified: Secondary | ICD-10-CM | POA: Diagnosis present

## 2016-04-04 DIAGNOSIS — R079 Chest pain, unspecified: Secondary | ICD-10-CM

## 2016-04-04 DIAGNOSIS — Z9889 Other specified postprocedural states: Secondary | ICD-10-CM | POA: Diagnosis not present

## 2016-04-04 DIAGNOSIS — Z9851 Tubal ligation status: Secondary | ICD-10-CM | POA: Diagnosis not present

## 2016-04-04 LAB — CBC WITH DIFFERENTIAL/PLATELET
Basophils Absolute: 0 10*3/uL (ref 0.0–0.1)
Basophils Relative: 1 %
Eosinophils Absolute: 0.2 10*3/uL (ref 0.0–0.7)
Eosinophils Relative: 3 %
HCT: 40.4 % (ref 36.0–46.0)
Hemoglobin: 13.5 g/dL (ref 12.0–15.0)
Lymphocytes Relative: 52 %
Lymphs Abs: 2.3 10*3/uL (ref 0.7–4.0)
MCH: 28.4 pg (ref 26.0–34.0)
MCHC: 33.4 g/dL (ref 30.0–36.0)
MCV: 84.9 fL (ref 78.0–100.0)
Monocytes Absolute: 0.5 10*3/uL (ref 0.1–1.0)
Monocytes Relative: 11 %
Neutro Abs: 1.5 10*3/uL — ABNORMAL LOW (ref 1.7–7.7)
Neutrophils Relative %: 33 %
Platelets: 269 10*3/uL (ref 150–400)
RBC: 4.76 MIL/uL (ref 3.87–5.11)
RDW: 14.2 % (ref 11.5–15.5)
WBC: 4.4 10*3/uL (ref 4.0–10.5)

## 2016-04-04 LAB — COMPREHENSIVE METABOLIC PANEL
ALT: 22 U/L (ref 14–54)
AST: 33 U/L (ref 15–41)
Albumin: 3.4 g/dL — ABNORMAL LOW (ref 3.5–5.0)
Alkaline Phosphatase: 77 U/L (ref 38–126)
Anion gap: 8 (ref 5–15)
BUN: 7 mg/dL (ref 6–20)
CO2: 20 mmol/L — ABNORMAL LOW (ref 22–32)
Calcium: 8.7 mg/dL — ABNORMAL LOW (ref 8.9–10.3)
Chloride: 108 mmol/L (ref 101–111)
Creatinine, Ser: 0.8 mg/dL (ref 0.44–1.00)
GFR calc Af Amer: >60 mL/min (ref >60)
GFR calc non Af Amer: >60 mL/min (ref >60)
Glucose, Bld: 123 mg/dL — ABNORMAL HIGH (ref 65–99)
Potassium: 3.3 mmol/L — ABNORMAL LOW (ref 3.5–5.1)
Sodium: 136 mmol/L (ref 135–145)
Total Bilirubin: 0.3 mg/dL (ref 0.3–1.2)
Total Protein: 6.7 g/dL (ref 6.5–8.1)

## 2016-04-04 LAB — TROPONIN I: Troponin I: <0.03 ng/mL (ref <0.03)

## 2016-04-04 LAB — RAPID URINE DRUG SCREEN, HOSP PERFORMED
Amphetamines: NOT DETECTED
Barbiturates: NOT DETECTED
Benzodiazepines: NOT DETECTED
Cocaine: POSITIVE — AB
Opiates: NOT DETECTED
Tetrahydrocannabinol: POSITIVE — AB

## 2016-04-04 LAB — ETHANOL: Alcohol, Ethyl (B): <5 mg/dL (ref <5)

## 2016-04-04 MED ORDER — LURASIDONE HCL 40 MG PO TABS: 40.0000 mg | ORAL_TABLET | Freq: Every day | ORAL | Status: DC

## 2016-04-04 MED ORDER — TRAZODONE HCL 50 MG PO TABS
50.0000 mg | ORAL_TABLET | Freq: Every evening | ORAL | Status: DC | PRN
  Administered 2016-04-06 (×2): 50 mg via ORAL
  Filled 2016-04-04 (×2): qty 1

## 2016-04-04 MED ORDER — POTASSIUM CHLORIDE CRYS ER 20 MEQ PO TBCR
40.0000 meq | EXTENDED_RELEASE_TABLET | Freq: Once | ORAL | Status: AC
  Administered 2016-04-04: 40 meq via ORAL
  Filled 2016-04-04: qty 2

## 2016-04-04 MED ORDER — ZOLPIDEM TARTRATE 5 MG PO TABS: 5.0000 mg | ORAL_TABLET | Freq: Every evening | ORAL | Status: DC | PRN

## 2016-04-04 MED ORDER — MAGNESIUM HYDROXIDE 400 MG/5ML PO SUSP: 30.0000 mL | Freq: Every day | ORAL | Status: DC | PRN

## 2016-04-04 MED ORDER — SERTRALINE HCL 50 MG PO TABS: 50.0000 mg | ORAL_TABLET | Freq: Every day | ORAL | Status: DC

## 2016-04-04 MED ORDER — SERTRALINE HCL 50 MG PO TABS
50.0000 mg | ORAL_TABLET | Freq: Every day | ORAL | Status: DC
  Administered 2016-04-05 – 2016-04-09 (×5): 50 mg via ORAL
  Filled 2016-04-04 (×7): qty 1

## 2016-04-04 MED ORDER — HYDROXYZINE HCL 25 MG PO TABS
25.0000 mg | ORAL_TABLET | Freq: Four times a day (QID) | ORAL | Status: DC | PRN
  Administered 2016-04-05 – 2016-04-06 (×4): 25 mg via ORAL
  Filled 2016-04-04 (×4): qty 1

## 2016-04-04 MED ORDER — ACETAMINOPHEN 325 MG PO TABS: 650.0000 mg | ORAL_TABLET | Freq: Four times a day (QID) | ORAL | Status: DC | PRN

## 2016-04-04 MED ORDER — LURASIDONE HCL 40 MG PO TABS
40.0000 mg | ORAL_TABLET | Freq: Every day | ORAL | Status: DC
  Administered 2016-04-05 – 2016-04-09 (×5): 40 mg via ORAL
  Filled 2016-04-04 (×7): qty 1

## 2016-04-04 MED ORDER — HYDROXYZINE HCL 25 MG PO TABS
25.0000 mg | ORAL_TABLET | Freq: Four times a day (QID) | ORAL | Status: DC | PRN
  Administered 2016-04-04: 25 mg via ORAL
  Filled 2016-04-04: qty 1

## 2016-04-04 MED ORDER — ALUM & MAG HYDROXIDE-SIMETH 200-200-20 MG/5ML PO SUSP: 30.0000 mL | ORAL | Status: DC | PRN

## 2016-04-04 NOTE — ED Notes (Signed)
Lying on bed w/eyes closed. Respirations even, unlabored. Pt woke briefly when VS were taken and ate few more bites of her dinner then returned to sleeping.

## 2016-04-04 NOTE — Progress Notes (Addendum)
[facility] to Dr. Jama Flavorsobos, room 305-2, patient to arrive at 8:30pm.  Call report at [phone] [facility] [place] notified.   Amy Le, LCSWA Disposition staff 04/04/2016 4:20 PM

## 2016-04-04 NOTE — ED Notes (Signed)
Left with [place] at this time.  Allowed to wear coat and hat.  Nothing in the pockets.

## 2016-04-04 NOTE — ED Notes (Signed)
Pharmacy Tech in w/pt. 

## 2016-04-04 NOTE — ED Notes (Signed)
Pt wanded by security. 

## 2016-04-04 NOTE — ED Notes (Signed)
[facility].  Waiting for transport at this time.

## 2016-04-04 NOTE — ED Notes (Signed)
[facility] burgundy [facility]. Sitter w/pt. 2 labeled belongings bags placed at nurses' desk for inventory.

## 2016-04-04 NOTE — ED Notes (Signed)
Pt changing into maroon scrubs 

## 2016-04-04 NOTE — ED Notes (Signed)
Pt eating dinner
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
## 2016-04-04 NOTE — ED Notes (Addendum)
Pt aware of tx plan - [facility] Flavorsobos after 2030 tonight. Pt voiced understanding and agreement w/plan - signed consent forms - [facility], copy sent to Medical Records, and [facility]. Pt also verbalized understanding and signed Medical Clearance Pt policy form. Copy given to pt and original placed on clipboard.
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
## 2016-04-04 NOTE — ED Provider Notes (Signed)
[facility].  Pt reexamined and is stable for transfer.   Jacalyn LefevreJulie Trentin Knappenberger, MD 04/04/16 2038

## 2016-04-04 NOTE — Progress Notes (Signed)
Patient ID: [name], female   DOB: 29-Dec-1975, 41 y.o.   MRN: [id number]  [facility]  Patient came to ED c/o a cough, then later states that she is depressed and has thoughts of hanging herself.  Patient was positive for cocaine & weed.  Patient was d/c from here less than two weeks ago.  [facility] and that is where she receives her medication according to the patient.  Patient sts that she lives with a friend and has no support systems.  Patient sts she is bipolar and very depressed.

## 2016-04-04 NOTE — ED Triage Notes (Signed)
Per EMS: pt from home c/o cough and pain with cough in chest x 3 weeks; pt seen here for same; pt admitted to cocaine use 1 week ago; IV 18 L FA; pt given 324mg  ASA
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
## 2016-04-05 MED ORDER — POTASSIUM CHLORIDE CRYS ER 20 MEQ PO TBCR
20.0000 meq | EXTENDED_RELEASE_TABLET | Freq: Two times a day (BID) | ORAL | Status: AC
  Administered 2016-04-05 – 2016-04-06 (×3): 20 meq via ORAL
  Filled 2016-04-05 (×3): qty 1

## 2016-04-05 NOTE — [facility] Group Notes (Signed)
[facility] LCSW Group Therapy  04/05/2016 3:51 PM  Type of Therapy:  Group Therapy  Participation Level:  Did Not Attend-pt invited. Chose to remain in bed.   Summary of Progress/Problems: Today's Topic: Overcoming Obstacles. Patients identified one short term goal and potential obstacles in reaching this goal. Patients processed barriers involved in overcoming these obstacles. Patients identified steps necessary for overcoming these obstacles and explored motivation (internal and external) for facing these difficulties head on.   Tameah Mihalko N Smart LCSW 04/05/2016, 3:51 PM

## 2016-04-05 NOTE — Tx Team (Signed)
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 acetaminophen (TYLENOL) tablet 650 mg  650 mg Oral Q6H PRN Jackelyn Poling, NP      . alum & mag hydroxide-simeth (MAALOX/MYLANTA) 200-200-20 MG/5ML suspension 30 mL  30 mL Oral Q4H PRN Jackelyn Poling, NP      . hydrOXYzine (ATARAX/VISTARIL) tablet 25 mg  25 mg Oral Q6H PRN Laveda Abbe, NP      . lurasidone (LATUDA) tablet 40 mg  40 mg Oral Q breakfast Laveda Abbe, NP   40 mg at 04/05/16 0841  . magnesium hydroxide (MILK OF MAGNESIA) suspension 30 mL  30 mL Oral Daily PRN Jackelyn Poling, NP      . potassium chloride SA (K-DUR,KLOR-CON) CR tablet 20 mEq  20 mEq Oral BID Oneta Rack, NP      . sertraline (ZOLOFT) tablet 50 mg  50 mg Oral Daily Laveda Abbe, NP   50 mg at 04/05/16 0841  . traZODone (DESYREL) tablet 50 mg  50 mg Oral QHS PRN Laveda Abbe, NP       PTA Medications: Prescriptions Prior to Admission  Medication Sig Dispense Refill Last Dose  . hydrOXYzine (ATARAX/VISTARIL) 25 MG tablet Take 1 tablet (25 mg total) by mouth every 6 (six) hours as needed for anxiety. (Patient taking differently: Take 25 mg by mouth 4 (four) times daily. ) 60 tablet 0 several days ago  . lurasidone (LATUDA) 40 MG TABS tablet Take 1 tablet (40 mg total) by mouth daily with breakfast. 30 tablet 0 several days ago  . Prazosin HCl (MINIPRESS PO) Take 1 tablet by mouth daily.   several days ago  .  sertraline (ZOLOFT) 50 MG tablet Take 1 tablet (50 mg total) by mouth daily. 30 tablet 0 unknown  . zolpidem (AMBIEN) 5 MG tablet Take 1 tablet (5 mg total) by mouth at bedtime as needed for sleep. (Patient taking differently: Take 5 mg by mouth at bedtime. ) 30 tablet 0 several days ago    Patient Stressors: Medication change or noncompliance Substance abuse  Patient Strengths: General fund of knowledge Motivation for treatment/growth  Treatment Modalities: Medication Management, Group therapy, Case management,  1 to 1 session with clinician, Psychoeducation, Recreational therapy.   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) Long Term Goal(s): Improvement in symptoms so as ready for discharge Improvement in symptoms so as ready for discharge   Short Term Goals: Ability to identify changes in lifestyle to reduce recurrence of condition will improve Ability to verbalize feelings will improve Ability to disclose and discuss suicidal ideas Ability to demonstrate self-control will improve Ability to identify changes in lifestyle to reduce recurrence of condition will improve Ability to verbalize feelings will improve Ability to identify and develop effective coping behaviors will improve Compliance with prescribed medications will improve Ability to identify triggers associated with substance abuse/mental health issues will improve  Medication Management: Evaluate patient's response, side effects, and tolerance of medication regimen.  Therapeutic Interventions: 1 to 1 sessions, Unit Group  sessions and Medication administration.  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) Active Problems:   Severe recurrent major depression (HCC)   Bipolar I  disorder (HCC)  Long Term Goal(s): Improvement in symptoms so as ready for discharge Improvement in symptoms so as ready for discharge   Short Term Goals: Ability to identify changes in lifestyle to reduce recurrence of condition will improve Ability to verbalize feelings will improve Ability to disclose and discuss suicidal ideas Ability to demonstrate self-control will improve Ability to identify changes in lifestyle to reduce recurrence of condition will improve Ability to verbalize feelings will improve Ability to identify and develop effective coping behaviors will improve Compliance with prescribed medications will improve Ability to identify triggers associated with substance abuse/mental health issues will improve     Medication Management: Evaluate patient's response, side effects, and tolerance of medication regimen.  Therapeutic Interventions: 1 to 1 sessions, Unit Group sessions and Medication administration.  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 RN will report any adverse and/or side effects to prescribing provider.  Therapeutic Interventions: 1 on 1 counseling sessions, Psychoeducation, Medication administration, Evaluate responses to treatment, Monitor vital signs and CBGs as ordered, Perform/monitor CIWA, COWS, AIMS and Fall Risk screenings as ordered, Perform wound care treatments as ordered.  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  Short Term Goals: Engage patient in aftercare planning with referrals and resources, Facilitate patient progression through stages of change regarding substance use diagnoses and concerns and Identify triggers associated with mental health/substance abuse issues  Therapeutic Interventions: Assess for all discharge needs, 1 to 1 time with Social worker, Explore available resources and support systems, Assess for adequacy in community support network, Educate family and significant other(s) on suicide prevention, Complete Psychosocial Assessment, Interpersonal group therapy.  Evaluation of Outcomes: Progressing   Progress in Treatment: Attending groups: Yes. Participating in groups: Yes. Taking medication as prescribed: Yes. Toleration medication: Yes. Family/Significant other contact made: No, will contact:  family member if patient consents Patient understands diagnosis: No. Minimal insight.  Discussing patient identified problems/goals with staff: Yes. Medical problems stabilized or resolved: Yes. Denies suicidal/homicidal ideation: Yes. Issues/concerns per patient self-inventory: No. Other: n/a  New problem(s) identified: No, Describe:  na/  New Short Term/Long Term Goal(s): detox; medication stabilization; development of comprehensive mental wellness/sobriety plan.   Discharge Plan or Barriers: [facility] and [facility]. [facility] and follows up with that provider.   Reason for Continuation of Hospitalization: Anxiety Depression Medication stabilization Suicidal ideation Withdrawal symptoms  Estimated Length of Stay:  3-5 days   Attendees: Patient: 04/05/2016 4:13 PM  Physician: Dr. Jama Flavorsobos MD 04/05/2016 4:13 PM  Nursing: Doy MinceKaren, Beverly RN 04/05/2016 4:13 PM  RN Care Manager: Onnie Boer [facility] 04/05/2016 4:13 PM  Social Worker: Herbert Seta Smart, LCSW; Vernie Shanks LCSW 04/05/2016 4:13 PM  Recreational Therapist:  04/05/2016 4:13 PM  Other: Armandina Stammer NP; Hillery Jacks NP 04/05/2016 4:13 PM  Other:  04/05/2016 4:13 PM  Other: 04/05/2016 4:13 PM    Scribe for Treatment Team: Ledell Peoples Smart, LCSW 04/05/2016 4:13 PM
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
## 2016-04-05 NOTE — [facility] Suicide Risk Assessment (Signed)
[facility] Admission Suicide Risk Assessment   Nursing information obtained from:  Patient Demographic factors:  Low socioeconomic status, Unemployed Current Mental Status:  Suicidal ideation indicated by patient Loss Factors:  Loss of significant relationship Historical Factors:  Prior suicide attempts Risk Reduction Factors:  Living with another person, especially a relative  Total Time spent with patient: 45 minutes Principal Problem: Cocaine Dependence, Substance Induced Mood Disorder, Substance Indcued Psychosis Diagnosis:   Patient Active Problem List   Diagnosis Date Noted  . 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 Bipolar I disorder (HCC) [F31.9] 04/04/2016  . Severe episode of recurrent major depressive disorder, without psychotic features (HCC) [F33.2]   . MDD (major depressive disorder), recurrent episode, severe (HCC) [F33.2] 03/25/2016  . PTSD (post-traumatic stress disorder) [F43.10] 03/25/2016  . Cocaine use disorder, severe, dependence (HCC) [F14.20] 10/07/2015    Continued Clinical Symptoms:    The "Alcohol Use Disorders Identification Test", Guidelines for Use in Primary Care, Second Edition.  World Science [employer]Health [employer] [facility](WHO). Score between 0-7:  no or low risk or alcohol related problems. Score between 8-15:  moderate risk of alcohol related problems. Score between 16-19:  high risk of alcohol related problems. Score 20 or above:  warrants further diagnostic evaluation for alcohol dependence and treatment.   CLINICAL FACTORS:  Patient is a 41 year old female, known to our unit, staff from prior admissions , most recently 03/24/16 and on 03/11/16. She has history of substance dependence- cocaine is identified as substance of choice- and a history of depression, mood instability. In the past has been diagnosed with Bipolar Disorder . Patient states that after her recent discharge from the unit she had stopped taking psychiatric medications and had  relapsed on cocaine.  She presented for depression, suicidal ideations with thoughts of hanging self , and with hallucinations, delusions about being possessed by a demon. At this time patient reports partial improvement , states her mood is better, reports decreasing hallucinations and does not appear internally preoccupied . No delusions expressed at this time. Dx- Cocaine Dependence, consider Cocaine Induced Mood Disorder, Psychosis, versus Bipolar Disorder, Depressed  Plan - inpatient admission. At this time patient is expressing interest in going to a rehab after discharge. Continue Latuda 40 mgrs QDAY , Zoloft 30 mgrs QDAY    Musculoskeletal: Strength & Muscle Tone: within normal limits Gait & Station: normal Patient leans: N/A  Psychiatric Specialty Exam: Physical Exam  ROS denies headache, denies chest pain, no shortness of breath, no vomiting   Blood pressure 129/82, pulse 89, temperature 97.7 F (36.5 C), temperature source Oral, resp. rate 18, height 5\' 6"  (1.676 m), weight 117.5 kg (259 lb), last menstrual period 03/27/2016, SpO2 100 %.Body mass index is 41.8 kg/m.  General Appearance: Fairly Groomed  Eye Contact:  Good  Speech:  Normal Rate  Volume:  Normal  Mood:  reports she is feeling better than on admission  Affect:  mildly constricted, but reactive   Thought Process:  Linear  Orientation:  Other:  fully alert and attentive   Thought Content:  describes decreasing hallucinations, at this time not internally preoccupied, no delusions expressed   Suicidal Thoughts:  No denies any current suicidal or self injurious ideations, contracts for safety on unit at this time  Homicidal Thoughts:  No denies any homicidal or violent ideations   Memory:  recent and remote grossly intact   Judgement:  Fair  Insight:  Fair  Psychomotor Activity:  Normal- no current psychomotor agitation or  restlessness   Concentration:  Concentration: Good and Attention Span: Good  Recall:  Good   Fund of Knowledge:  Good  Language:  Good  Akathisia:  Negative  Handed:  Right  AIMS (if indicated):     Assets:  Desire for Improvement Resilience  ADL's: improving   Cognition:  WNL  Sleep:  Number of Hours: 6.25      COGNITIVE FEATURES THAT CONTRIBUTE TO RISK:  Closed-mindedness and Loss of executive function    SUICIDE RISK:   Moderate:  Frequent suicidal ideation with limited intensity, and duration, some specificity in terms of plans, no associated intent, good self-control, limited dysphoria/symptomatology, some risk factors present, and identifiable protective factors, including available and accessible social support.  PLAN OF CARE: Patient will be admitted to inpatient psychiatric unit for stabilization and safety. Will provide and encourage milieu participation. Provide medication management and maked adjustments as needed.  Will follow daily.    I certify that inpatient services furnished can reasonably be expected to improve the patient's condition.   Nehemiah Massed, MD 04/05/2016, 3:58 PM

## 2016-04-05 NOTE — Plan of Care (Signed)
Problem: Education: Goal: Utilization of techniques to improve thought processes will improve Outcome: Progressing Nurse discussed depression/coping skills with patient.    

## 2016-04-05 NOTE — [facility] Suicide Risk Assessment (Signed)
[facility] INPATIENT:  Family/Significant Other Suicide Prevention Education  Suicide Prevention Education:  Patient Refusal for Family/Significant Other Suicide Prevention Education: The patient Amy RankinJacquetta Cueva has refused to provide written consent for family/significant other to be provided Family/Significant Other Suicide Prevention Education during admission and/or prior to discharge.  Physician notified.  SPE completed with pt, as pt refused to consent to family contact. SPI pamphlet provided to pt and pt was encouraged to share information with support network, ask questions, and talk about any concerns relating to SPE. Pt denies access to guns/firearms and verbalized understanding of information provided. Mobile Crisis information also provided to pt.   Panda Crossin N Smart LCSW 04/05/2016, 12:57 PM

## 2016-04-05 NOTE — Progress Notes (Signed)
D:  Patient's self inventory sheet, patient sleeps good, no sleep medication given.  Fair appetite, low energy level, poor concentration.  Rated depression, hopeless and anxiety #7.  Denied withdrawals.  Denied SI.  Patient feels very tired and sleepy today.  Pain in legs, worst pain #4. Pain medication is helpful.  Goal is to feel better physically. A:  Medications administered per MD orders.  Emotional support and encouragement given patient. R:  Denied SI and HI, contracts for safety.  Denied A/V hallucinations.  Safety maintained with 15 minute checks.

## 2016-04-05 NOTE — Progress Notes (Signed)
Recreation Therapy Notes  Date: 04/05/16 Time: 0930 Location: [address] Group Room  Group Topic: Stress Management  Goal Area(s) Addresses:  Patient will verbalize importance of using healthy stress management.  Patient will identify positive emotions associated with healthy stress management.   Intervention: Guided Imagery  Activity :  Peaceful Place.  LRT introduced the stress management technique of guided imagery.  LRT read a script that allowed patients to take a mental journey to their favorite place.  Patients were to listen and follow along as the LRT read the script.  Education:  Stress Management, Discharge Planning.   Education Outcome: Acknowledges edcuation/In group clarification offered/Needs additional education  Clinical Observations/Feedback: Pt did not attend group.   Jahlen Bollman, LRT/CTRS         Marcianne Ozbun A 04/05/2016 12:06 PM 

## 2016-04-05 NOTE — Progress Notes (Signed)
Took over care of this patient at present time    Pt is in bed asleep with no distress noted   Will continue to monitor Q 15 min and for any needs the patient may have

## 2016-04-05 NOTE — Tx Team (Signed)
Initial Treatment Plan 04/05/2016 12:09 AM Amy RankinJacquetta Le ONG:[id number]    PATIENT STRESSORS: Medication change or noncompliance Substance abuse   PATIENT STRENGTHS: General fund of knowledge Motivation for treatment/growth   PATIENT IDENTIFIED PROBLEMS: "there are demons in my body"                     DISCHARGE CRITERIA:  Improved stabilization in mood, thinking, and/or behavior Verbal commitment to aftercare and medication compliance  PRELIMINARY DISCHARGE PLAN: Attend aftercare/continuing care group Attend 12-step recovery group  PATIENT/FAMILY INVOLVEMENT: This treatment plan has been presented to and reviewed with the patient, Amy RankinJacquetta Le, and/or family member, .  The patient and family have been given the opportunity to ask questions and make suggestions.  Andrena Mewsuttall, Qusai Kem J, RN 04/05/2016, 12:09 AM
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
## 2016-04-06 DIAGNOSIS — F332 Major depressive disorder, recurrent severe without psychotic features: Principal | ICD-10-CM

## 2016-04-06 NOTE — Progress Notes (Signed)
Recreation Therapy Notes  Animal-Assisted Activity (AAA) Program Checklist/Progress Notes Patient Eligibility Criteria Checklist & Daily Group note for Rec TxIntervention  Date: 01.23.2018 Time: 2:50pm Location: [address]    AAA/T Program Assumption of Risk Form signed by Patient/ or Parent Legal Guardian NO. Patient refused AAA/T services during admission.   Marykay Lexenise L Treshon Stannard, LRT/CTRS           Luvena Wentling L 04/06/2016 3:06 PM

## 2016-04-06 NOTE — Progress Notes (Signed)
D: Pt is flat isolative and withdrawn to room; remained in bed with eye closed for most of the evening. Pt at the time of assessment endorses moderate anxiety, depression and helplessness; states, "I am not feeling too good; I can even go for groups; I will probably feel better tomorrow." Pt however; denied SI, HI, pain or AVH. At the time of assessment, Pt did not look to be in any acute distress. A: Medications offered as prescribed.  Support, encouragement, and safe environment provided.  15-minute safety checks continue. R: Pt was med compliant.  Pt did not attend wrap-up group. Safety checks continue.

## 2016-04-06 NOTE — Progress Notes (Signed)
Psychoeducational Group Note  Date:  04/06/2016 Time:  2321  Group Topic/Focus:  Wrap-Up Group:   The focus of this group is to help patients review their daily goal of treatment and discuss progress on daily workbooks.  Participation Level: Did Not Attend  Participation Quality:  Not Applicable  Affect:  Not Applicable  Cognitive:  Not Applicable  Insight:  Not Applicable  Engagement in Group: Not Applicable  Additional Comments:  The patient did not attend group since she remained in her room asleep.   Hazle CocaGOODMAN, Harlyn Italiano S 04/06/2016, 11:21 PM

## 2016-04-06 NOTE — [facility] Group Notes (Signed)
The focus of this group is to educate the patient on the purpose and policies of crisis stabilization and provide a format to answer questions about their admission.  The group details unit policies and expectations of patients while admitted.  Patient stayed in bed and did not attend 0900 nurse education orientation group this morning.  

## 2016-04-06 NOTE — Progress Notes (Signed)
D:  Patient's self inventory sheet, patient sleeps good, no sleep medication given.  Good appetite, normal energy level, poor concentration.  Rated depression 5, denied hopeless, nxiety 7.  Withdrawals, agitation, irritability.  Denied SI.  Denied physical problems, lightheaded.  Denied pain, knees, no pain medication given.  Goal is work on discharge plans.  Plans to participate in group.  No discharge plans. A:  Medications administered per MD orders.  Emotional support and encouragement given patient. R:  Denied SI and HI, contracts for safety.  Denied A/V hallucinations.  Safety maintained with 15 minute checks.

## 2016-04-06 NOTE — Progress Notes (Signed)
[facility] MD Progress Note  04/06/2016 12:35 PM Obera Stauch  MRN:  [id number] Subjective:  Patient states, "yes I just left here but I'm going to [facility] this time." Objective:Patient seen and chart reviewed.Discussed patient with treatment team.  Patient today is seen.  C/O anxiety.  She made comment that she felt better from the last time she was here inpatient, mid Jan.  No disruptive behaviors.  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 Bipolar I disorder (HCC) [F31.9] 04/04/2016  . Severe episode of recurrent major depressive disorder, without psychotic features (HCC) [F33.2]   . MDD (major depressive disorder), recurrent episode, severe (HCC) [F33.2] 03/25/2016  . PTSD (post-traumatic stress disorder) [F43.10] 03/25/2016  . 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   Past Medical History:  Past Medical History:  Diagnosis Date  . Bipolar 1 disorder (HCC)   . BV (bacterial vaginosis)   . Chlamydia   . Trichomonas     Past Surgical History:  Procedure Laterality Date  . FOOT SURGERY    . MULTIPLE TOOTH EXTRACTIONS Bilateral 2017  . TUBAL LIGATION     Family History:  Family History  Problem Relation Age of Onset  . Mental illness Neg Hx    Family Psychiatric  History: Please see H&P.  Social History: Please see H&P.  History  Alcohol Use No     History  Drug Use  . Types: "Crack" cocaine, Cocaine, Marijuana    Social History   Social History  . Marital status: Divorced    Spouse name: N/A  . Number of children: N/A  . Years of education: N/A   Social History Main Topics  . Smoking status: Current  Every Day Smoker    Packs/day: 0.50    Types: Cigarettes  . Smokeless tobacco: Never Used  . Alcohol use No  . Drug use: Yes    Types: "Crack" cocaine, Cocaine, Marijuana  . Sexual activity: Yes    Birth control/ protection: Surgical   Other Topics Concern  . None   Social History Narrative  . None   Additional Social History:       Sleep: Fair  Appetite:  Poor  Current Medications: Current Facility-Administered Medications  Medication Dose Route Frequency Provider Last Rate Last Dose  . acetaminophen (TYLENOL) tablet 650 mg  650 mg Oral Q6H PRN Jackelyn Poling, NP      . alum & mag hydroxide-simeth (MAALOX/MYLANTA) 200-200-20 MG/5ML suspension 30 mL  30 mL Oral Q4H PRN Jackelyn Poling, NP      . hydrOXYzine (ATARAX/VISTARIL) tablet 25 mg  25 mg Oral Q6H PRN Laveda Abbe, NP   25 mg at 04/06/16 0005  . lurasidone (LATUDA) tablet 40 mg  40 mg Oral Q breakfast Laveda Abbe, NP   40 mg at 04/06/16 0630  . magnesium hydroxide (MILK OF MAGNESIA) suspension 30 mL  30 mL Oral Daily PRN Jackelyn Poling, NP      . potassium chloride SA (K-DUR,KLOR-CON) CR tablet 20 mEq  20 mEq Oral BID Oneta Rack, NP   20 mEq at 04/06/16 0828  . sertraline (ZOLOFT) tablet 50 mg  50 mg Oral Daily Laveda Abbe, NP  50 mg at 04/06/16 0829  . traZODone (DESYREL) tablet 50 mg  50 mg Oral QHS PRN Laveda Abbe, NP   50 mg at 04/06/16 0005    Lab Results:  Results for orders placed or performed during the hospital encounter of 04/04/16 (from the past 48 hour(s))  Urine rapid drug screen (hosp performed)not at [facility]     Status: Abnormal   Collection Time: 04/04/16  1:30 PM  Result Value Ref Range   Opiates NONE DETECTED NONE DETECTED   Cocaine POSITIVE (A) NONE DETECTED   Benzodiazepines NONE DETECTED NONE DETECTED   Amphetamines NONE DETECTED NONE DETECTED   Tetrahydrocannabinol POSITIVE (A) NONE DETECTED   Barbiturates NONE DETECTED NONE DETECTED    Comment:        DRUG  SCREEN FOR MEDICAL PURPOSES ONLY.  IF CONFIRMATION IS NEEDED FOR ANY PURPOSE, NOTIFY LAB WITHIN 5 DAYS.        LOWEST DETECTABLE LIMITS FOR URINE DRUG SCREEN Drug Class       Cutoff (ng/mL) Amphetamine      1000 Barbiturate      200 Benzodiazepine   200 Tricyclics       300 Opiates          300 Cocaine          300 THC              50     Blood Alcohol level:  Lab Results  Component Value Date   ETH <5 04/04/2016   ETH <5 03/13/2016    Metabolic Disorder Labs: Lab Results  Component Value Date   HGBA1C 5.6 03/26/2016   MPG 114 03/26/2016   Lab Results  Component Value Date   PROLACTIN 5.8 03/26/2016   Lab Results  Component Value Date   CHOL 151 03/26/2016   TRIG 149 03/26/2016   HDL 40 (L) 03/26/2016   CHOLHDL 3.8 03/26/2016   VLDL 30 03/26/2016   LDLCALC 81 03/26/2016    Physical Findings: AIMS: Facial and Oral Movements Muscles of Facial Expression: None, normal Lips and Perioral Area: None, normal Jaw: None, normal Tongue: None, normal,Extremity Movements Upper (arms, wrists, hands, fingers): None, normal Lower (legs, knees, ankles, toes): None, normal, Trunk Movements Neck, shoulders, hips: None, normal, Overall Severity Severity of abnormal movements (highest score from questions above): None, normal Incapacitation due to abnormal movements: None, normal Patient's awareness of abnormal movements (rate only patient's report): No Awareness, Dental Status Current problems with teeth and/or dentures?: No Does patient usually wear dentures?: No  CIWA:  CIWA-Ar Total: 1 COWS:  COWS Total Score: 2  Musculoskeletal: Strength & Muscle Tone: within normal limits Gait & Station: normal Patient leans: N/A  Psychiatric Specialty Exam: Physical Exam  Nursing note and vitals reviewed.   Review of Systems  Musculoskeletal: Positive for myalgias.  Psychiatric/Behavioral: Positive for depression and substance abuse. The patient is nervous/anxious.   All  other systems reviewed and are negative.   Blood pressure (!) 100/53, pulse 75, temperature 98.1 F (36.7 C), temperature source Oral, resp. rate 18, height 5\' 6"  (1.676 m), weight 117.5 kg (259 lb), last menstrual period 03/27/2016, SpO2 100 %.Body mass index is 41.8 kg/m.  General Appearance: Guarded  Eye Contact:  Minimal  Speech:  Slow  Volume:  Decreased  Mood:  Anxious and Dysphoric  Affect:  Depressed  Thought Process:  Goal Directed and Descriptions of Associations: Circumstantial  Orientation:  Full (Time, Place, and Person)  Thought Content:  Rumination  Suicidal  Thoughts:  No  Homicidal Thoughts:  No  Memory:  Immediate;   Fair Recent;   Fair Remote;   Fair  Judgement:  Impaired  Insight:  Shallow  Psychomotor Activity:  Decreased  Concentration:  Concentration: Fair and Attention Span: Fair  Recall:  [employer]Fair  Fund of Knowledge:  Fair  Language:  Fair  Akathisia:  No  Handed:  Right  AIMS (if indicated):     Assets:  Desire for Improvement  ADL's:  Intact  Cognition:  WNL  Sleep:  Number of Hours: 6.25   Treatment Plan Summary:  pt remains isolative and wanting to avoid crowds.  However, hopeful with [facility] treatments - continue to encourage and treat.  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  Daily contact with patient to assess and evaluate symptoms and progress in treatment and Medication management   For depressive sx: Restart Zoloft 50 mg po daily. Latuda 40 mg po with meals.  For insomnia: Trazodone 50 mg po qhs PRN  For anxiety/agitation PRN medications as per unit protocol.  Reviewed past medical records,treatment plan.   Will continue to monitor vitals ,medication compliance and treatment side effects while patient is here.   Will monitor for medical issues as well as call consult as needed.   Reviewed labs - potassium is  3.3, supplemented with 3 doses of K+ 20 meQ, UDS cocaine and THC positive  CSW will continue working on disposition.   Patient to participate in therapeutic milieu .   Lindwood QuaSheila May Agustin, NP [facility] 04/06/2016, 12:35 PM   Agree with NP Progress Note

## 2016-04-06 NOTE — Plan of Care (Signed)
Problem: Education: Goal: Knowledge of the prescribed therapeutic regimen will improve Outcome: Progressing Nurse discussed depression/coping skills with patient.        

## 2016-04-06 NOTE — [facility] Group Notes (Signed)
[facility] LCSW Group Therapy  04/06/2016 3:07 PM  Type of Therapy:  Group Therapy  Participation Level:  Did Not Attend-pt invited. Chose to rest in room.   Summary of Progress/Problems: MHA Speaker came to talk about his personal journey with substance abuse and addiction. The pt processed ways by which to relate to the speaker. MHA speaker provided handouts and educational information pertaining to groups and services [facility].   Geralynn Capri N Smart LCSW 04/06/2016, 3:07 PM

## 2016-04-07 DIAGNOSIS — F17210 Nicotine dependence, cigarettes, uncomplicated: Secondary | ICD-10-CM

## 2016-04-07 DIAGNOSIS — F319 Bipolar disorder, unspecified: Secondary | ICD-10-CM

## 2016-04-07 DIAGNOSIS — F4310 Post-traumatic stress disorder, unspecified: Secondary | ICD-10-CM

## 2016-04-07 DIAGNOSIS — F14251 Cocaine dependence with cocaine-induced psychotic disorder with hallucinations: Secondary | ICD-10-CM

## 2016-04-07 DIAGNOSIS — Z79899 Other long term (current) drug therapy: Secondary | ICD-10-CM

## 2016-04-07 DIAGNOSIS — Z9851 Tubal ligation status: Secondary | ICD-10-CM

## 2016-04-07 DIAGNOSIS — Z9889 Other specified postprocedural states: Secondary | ICD-10-CM

## 2016-04-07 MED ORDER — HYDROXYZINE HCL 50 MG PO TABS
50.0000 mg | ORAL_TABLET | Freq: Four times a day (QID) | ORAL | Status: DC | PRN
Start: 2016-04-07 — End: 2016-04-09
  Administered 2016-04-07 – 2016-04-08 (×4): 50 mg via ORAL
  Filled 2016-04-07 (×4): qty 1

## 2016-04-07 MED ORDER — TRAZODONE HCL 100 MG PO TABS
100.0000 mg | ORAL_TABLET | Freq: Every evening | ORAL | Status: DC | PRN
  Administered 2016-04-07 – 2016-04-08 (×2): 100 mg via ORAL
  Filled 2016-04-07 (×2): qty 1

## 2016-04-07 NOTE — Progress Notes (Signed)
Recreation Therapy Notes  Date: 04/07/16 Time: 0930 Location: 300 [address] Dayroom  Group Topic: Stress Management  Goal Area(s) Addresses:  Patient will verbalize importance of using healthy stress management.  Patient will identify positive emotions associated with healthy stress management.   Intervention: Stress Management  Activity :  Body Scan Meditation.  LRT introduced the stress management technique of meditation.  LRT played a meditation from the calm app that allowed patients the opportunity to focus on any tension they may be experiencing in their bodies.  Patients were to follow along with the meditation.  Education:  Stress Management, Discharge Planning.   Education Outcome: Acknowledges edcuation/In group clarification offered/Needs additional education  Clinical Observations/Feedback: Pt did not attend group.    Caroll RancherMarjette Tyshea Imel, LRT/CTRS         Caroll RancherLindsay, Sebastyan Snodgrass A 04/07/2016 2:46 PM

## 2016-04-07 NOTE — Progress Notes (Signed)
D: Pt is flat isolative and withdrawn to room; remained in bed with eye closed for most of the evening. Pt at the time of assessment endorses moderate anxiety and depression; states, "my mind has been going through a lot; still got some work to do." Pt however; denied SI, HI, pain or AVH. At the time of assessment, Pt never looked to be in any acute distress. A: PRN medications offered as prescribed.  Support, encouragement, and safe environment provided. 15-minute safety checks continue. R: Pt was med compliant.  Pt did not attend AA group. Safety checks continue.

## 2016-04-07 NOTE — [facility] Group Notes (Signed)
[facility] LCSW Group Therapy  04/07/2016 3:42 PM  Type of Therapy:  Group Therapy  Participation Level:  Did Not Attend-pt invited. Chose to remain in bed.   Summary of Progress/Problems: Emotion Regulation: This group focused on both positive and negative emotion identification and allowed group members to process ways to identify feelings, regulate negative emotions, and find healthy ways to manage internal/external emotions. Group members were asked to reflect on a time when their reaction to an emotion led to a negative outcome and explored how alternative responses using emotion regulation would have benefited them. Group members were also asked to discuss a time when emotion regulation was utilized when a negative emotion was experienced.   Amy PeoplesHeather N Smart LCSW 04/07/2016, 3:42 PM

## 2016-04-07 NOTE — Progress Notes (Signed)
D- Patient alert and oriented. Patient has been observed in the bed for majority of the shift.  Patient denies SI, HI, AVH, and pain.  Patient did not go down to the cafeteria for breakfast and lunch. Patient verbalizes that she will go down for dinner. Patient verbally rates her anxiety "8", depression "7", and feeling of hopelessness "5" with 10 being the worst.  Patient's goal for the rest of the day is to "get up, attend groups, and go down to dinner". No complaints. Patient states "I'm just resting".   A- Scheduled medications administered to patient, per MD orders. Support and encouragement provided.  Routine safety checks conducted every 15 minutes.  Patient informed to notify staff with problems or concerns. R- No adverse drug reactions noted. Patient contracts for safety at this time.  Patient remains safe at this time.

## 2016-04-07 NOTE — Progress Notes (Signed)
Nursing Progress Note 7p-7a  D) Patient presents with flat affect. Patient denies SI/HI/AVH or pain. Patient contracts for safety at this time. Patient refused to go to group despite staff and peer encouragement. Patient states "i'm not interested. I just want my meds". Patient sat outside of the dayroom and was instructed by staff to go to group or return to room. Patient returned to room.    A) Patient medicated with PM orders as prescribed. Medications reviewed with patient. Patient on q15 min safety checks. Emotional support given. Patient encouraged to attend groups. Opportunities for questions or concerns presented to patient. Patient encouraged to continue to work on treatment goals.  R) Patient receptive to interaction with nurse. Patient remains safe on the unit at this time. Patient is resting in bed without complaints. Will continue to monitor.

## 2016-04-07 NOTE — Progress Notes (Signed)
[facility]  04/07/2016 1:32 PM Amy RankinJacquetta Le  MRN:  [id number] Subjective:  Patient states, "still not sleeping good." Objective:Patient seen and chart reviewed.  Discussed patient with treatment team.  Patient today is seen.  C/O anxiety.  She made comment that she felt better from the last time she was here inpatient, mid Jan.  No disruptive behaviors.  Medication compliant.  Did c/o of insomnia and anxiety.  Patient still feels and appears unmotivated.  Spends a lot of time in room and sleeping.  However, she denied that she was always in room.  Patient states that she has been in group.    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 Bipolar I disorder (HCC) [F31.9] 04/04/2016  . Severe episode of recurrent major depressive disorder, without psychotic features (HCC) [F33.2]   . MDD (major depressive disorder), recurrent episode, severe (HCC) [F33.2] 03/25/2016  . PTSD (post-traumatic stress disorder) [F43.10] 03/25/2016  . 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   Past Medical History:  Past Medical History:  Diagnosis Date  . Bipolar 1 disorder (HCC)   . BV (bacterial vaginosis)   . Chlamydia   . Trichomonas     Past Surgical History:  Procedure Laterality Date  . FOOT SURGERY    . MULTIPLE TOOTH EXTRACTIONS Bilateral 2017  . TUBAL LIGATION     Family History:  Family History  Problem Relation Age of Onset  . Mental illness Neg Hx    Family Psychiatric  History: Please see H&P.  Social History: Please see H&P.  History  Alcohol Use No     History  Drug Use  . Types: "Crack" cocaine, Cocaine, Marijuana    Social  History   Social History  . Marital status: Divorced    Spouse name: N/A  . Number of children: N/A  . Years of education: N/A   Social History Main Topics  . Smoking status: Current Every Day Smoker    Packs/day: 0.50    Types: Cigarettes  . Smokeless tobacco: Never Used  . Alcohol use No  . Drug use: Yes    Types: "Crack" cocaine, Cocaine, Marijuana  . Sexual activity: Yes    Birth control/ protection: Surgical   Other Topics Concern  . None   Social History Narrative  . None   Additional Social History:       Sleep: Fair  Appetite:  Poor  Current Medications: Current Facility-Administered Medications  Medication Dose Route Frequency Provider Last Rate Last Dose  . acetaminophen (TYLENOL) tablet 650 mg  650 mg Oral Q6H PRN Jackelyn PolingJason A Berry, NP      . alum & mag hydroxide-simeth (MAALOX/MYLANTA) 200-200-20 MG/5ML suspension 30 mL  30 mL Oral Q4H PRN Jackelyn PolingJason A Berry, NP      . hydrOXYzine (ATARAX/VISTARIL) tablet 50 mg  50 mg Oral Q6H PRN Adonis BrookSheila Agustin, NP      . lurasidone (LATUDA) tablet 40 mg  40 mg Oral Q breakfast Laveda AbbeLaurie Britton Parks, NP   40 mg at 04/07/16 0919  . magnesium hydroxide (MILK OF MAGNESIA) suspension 30 mL  30 mL Oral Daily PRN Jackelyn PolingJason A Berry, NP      . sertraline (ZOLOFT) tablet 50 mg  50  mg Oral Daily Laveda Abbe, NP   50 mg at 04/07/16 0919  . traZODone (DESYREL) tablet 100 mg  100 mg Oral QHS PRN Adonis Brook, NP        Lab Results:  No results found for this or any previous visit (from the past 48 hour(s)).  Blood Alcohol level:  Lab Results  Component Value Date   [facility] <5 04/04/2016   ETH <5 03/13/2016    Metabolic Disorder Labs: Lab Results  Component Value Date   HGBA1C 5.6 03/26/2016   MPG 114 03/26/2016   Lab Results  Component Value Date   PROLACTIN 5.8 03/26/2016   Lab Results  Component Value Date   CHOL 151 03/26/2016   TRIG 149 03/26/2016   HDL 40 (L) 03/26/2016   CHOLHDL 3.8 03/26/2016   VLDL 30 03/26/2016    LDLCALC 81 03/26/2016    Physical Findings: AIMS: Facial and Oral Movements Muscles of Facial Expression: None, normal Lips and Perioral Area: None, normal Jaw: None, normal Tongue: None, normal,Extremity Movements Upper (arms, wrists, hands, fingers): None, normal Lower (legs, knees, ankles, toes): None, normal, Trunk Movements Neck, shoulders, hips: None, normal, Overall Severity Severity of abnormal movements (highest score from questions above): None, normal Incapacitation due to abnormal movements: None, normal Patient's awareness of abnormal movements (rate only patient's report): No Awareness, Dental Status Current problems with teeth and/or dentures?: No Does patient usually wear dentures?: No  CIWA:  CIWA-Ar Total: 1 COWS:  COWS Total Score: 1  Musculoskeletal: Strength & Muscle Tone: within normal limits Gait & Station: normal Patient leans: N/A  Psychiatric Specialty Exam: Physical Exam  Nursing note and vitals reviewed.   Review of Systems  Musculoskeletal: Positive for myalgias.  Psychiatric/Behavioral: Positive for depression and substance abuse. The patient is nervous/anxious.   All other systems reviewed and are negative.   Blood pressure 104/70, pulse 88, temperature 97.6 F (36.4 C), temperature source Oral, resp. rate 18, height 5\' 6"  (1.676 m), weight 117.5 kg (259 lb), last menstrual period 03/27/2016, SpO2 100 %.Body mass index is 41.8 kg/m.  General Appearance: Guarded  Eye Contact:  Minimal  Speech:  Slow  Volume:  Decreased  Mood:  Anxious and Dysphoric  Affect:  Depressed  Thought Process:  Goal Directed and Descriptions of Associations: Circumstantial  Orientation:  Full (Time, Place, and Person)  Thought Content:  Rumination  Suicidal Thoughts:  No  Homicidal Thoughts:  No  Memory:  Immediate;   Fair Recent;   Fair Remote;   Fair  Judgement:  Impaired  Insight:  Shallow  Psychomotor Activity:  Decreased  Concentration:   Concentration: Fair and Attention Span: Fair  Recall:  [employer] of Knowledge:  Fair  Language:  Fair  Akathisia:  No  Handed:  Right  AIMS (if indicated):     Assets:  Desire for Improvement  ADL's:  Intact  Cognition:  WNL  Sleep:  Number of Hours: 5   Treatment Plan Summary:  pt remains isolative and wanting to avoid crowds.  However, hopeful with [facility] treatments - continue to encourage and treat.  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  Daily contact with patient to assess and evaluate symptoms and progress in treatment and Medication management   For depressive sx: Restart Zoloft 50 mg po daily. Latuda 40 mg po with meals.  For insomnia: Increase Trazodone to 100 mg po qhs  PRN  For anxiety/agitation PRN medications as per unit protocol.  Increased Hydroxyzine to 100 mg PRN.  Reviewed past medical records,treatment plan.   Will continue to monitor vitals ,medication compliance and treatment side effects while patient is here.   Will monitor for medical issues as well as call consult as needed.   Reviewed labs - potassium is 3.3, supplemented with 3 doses of K+ 20 meQ, UDS cocaine and THC positive  CSW will continue working on disposition.   Patient to participate in therapeutic milieu .   Lindwood Qua, NP [facility] 04/07/2016, 1:32 PM   Agree with NP Progress Note

## 2016-04-08 NOTE — Plan of Care (Signed)
Problem: Activity: Goal: Interest or engagement in activities will improve Outcome: Not Progressing Patient not attending groups. Patient expresses no interest.  Problem: Safety: Goal: Periods of time without injury will increase Outcome: Progressing Patient remains safe on the unit at this time. Patient is on q15 min safety checks and contracts for safety.

## 2016-04-08 NOTE — Progress Notes (Signed)
D:  Patient's self inventory sheet, patient sleeps fair, sleep medication not helpful.   Fair appetite, low energy level, poor concentration.  Rated depression, hopeless and anxiety #7.  Denied withdrawals.  Denied SI.  Leg pain at times, worst pain #6 in past 24 hours, pain medication helpful.  Goal is feel better physically.  No discharge plans.  Patient wants ativan.  A:  Medications administered per MD orders.  Emotional support and encouragement. R:  Denied SI and HI, contracts for safety.  Denied A/V hallucinations.  Safety maintained with 15 minute checks.

## 2016-04-08 NOTE — [facility] Group Notes (Signed)
[facility] Group Notes:  (Nursing/MHT/Case Management/Adjunct)  Date:  04/08/2016  Time:  11:29 AM  Type of Therapy:  Nurse Education  Participation Level:  Did Not Attend  Participation Quality:  Patient did not attend  Affect:  Patient did not attend  Cognitive:  Patient did not attend  Insight:  None  Engagement in Group:  Patient did not attend  Modes of Intervention:  Discussion and patient did not attend group  Summary of Progress/Problems:  Patient attended and participated in a nurse led group.  Patients were asked to verbalize changes that they would like to make in their lives, barriers to change, and to identify the steps needed for change. Patient did not attend group.  Kaleth Koy P 04/08/2016, 11:29 AM  

## 2016-04-08 NOTE — Plan of Care (Signed)
Problem: Coping: Goal: Ability to cope will improve Outcome: Progressing Nurse discussed depression/coping skills with patient.    

## 2016-04-08 NOTE — Progress Notes (Signed)
CSW met with pt individually. There are no available beds at [facility] and pt has been made aware. Pt has been given [facility] information and briefed about the program. She verbalized understanding of above and agreed to contact Satira Sark this afternoon. Pt will follow-up with CSW in the morning.   Maxie Better, MSW, LCSW Clinical Social Worker 04/08/2016 2:18 PM

## 2016-04-08 NOTE — [facility] Group Notes (Signed)
[place] LCSW Group Therapy  04/08/2016 12:47 PM  Type of Therapy:  Group Therapy  Participation Level:  Did Not Attend-pt declined. She was eating lunch. CSW met with pt individually.   MHA Speaker came to talk about his personal journey with substance abuse and addiction. The pt processed ways by which to relate to the speaker. [place] speaker provided handouts and educational information pertaining to groups and services offered by the [facility].   Naesha Buckalew N Smart LCSW 04/08/2016, 12:47 PM

## 2016-04-08 NOTE — Progress Notes (Signed)
[facility] Progress Note  04/08/2016 4:28 PM Amy Le  MRN:  [id number]  Subjective:  Amy Le reports, "I came here because I was feeling suicidal & depressed. I am still feeling the depression, but not as bad as when I came in. I kind of feel hopeless & discouraged today".  Objective: Patient seen and chart reviewed.Discussed patient with treatment team.  Patient today presents with flat affect. She says she feels kind of hopeless & discouraged. The flu symptoms has improved she reported. She is still not attending or participating in group sessions She is encouraged to continue taking medications. No medications changes will be made on her treatment regimen . Will continue to use supportive measures for flu like sx.  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 Bipolar I disorder (HCC) [F31.9] 04/04/2016  . Severe episode of recurrent major depressive disorder, without psychotic features (HCC) [F33.2]   . MDD (major depressive disorder), recurrent episode, severe (HCC) [F33.2] 03/25/2016  . PTSD (post-traumatic stress disorder) [F43.10] 03/25/2016  . 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   Past Medical History:  Past Medical History:  Diagnosis Date  . Bipolar 1 disorder (HCC)   . BV (bacterial vaginosis)   . Chlamydia   . Trichomonas     Past Surgical History:  Procedure Laterality Date  . FOOT SURGERY    . MULTIPLE TOOTH EXTRACTIONS Bilateral 2017  . TUBAL LIGATION     Family History:  Family History  Problem Relation Age of Onset  . Mental illness Neg Hx    Family Psychiatric  History: Please see H&P.  Social History:  Please see H&P.  History  Alcohol Use No     History  Drug Use  . Types: "Crack" cocaine, Cocaine, Marijuana    Social History   Social History  . Marital status: Divorced    Spouse name: N/A  . Number of children: N/A  . Years of education: N/A   Social History Main Topics  . Smoking status: Current Every Day Smoker    Packs/day: 0.50    Types: Cigarettes  . Smokeless tobacco: Never Used  . Alcohol use No  . Drug use: Yes    Types: "Crack" cocaine, Cocaine, Marijuana  . Sexual activity: Yes    Birth control/ protection: Surgical   Other Topics Concern  . None   Social History Narrative  . None   Additional Social History:   Sleep: Good  Appetite:  Good  Current Medications: Current Facility-Administered Medications  Medication Dose Route Frequency Provider Last Rate Last Dose  . acetaminophen (TYLENOL) tablet 650 mg  650 mg Oral Q6H PRN Jackelyn PolingJason A Berry, NP      . alum & mag hydroxide-simeth (MAALOX/MYLANTA) 200-200-20 MG/5ML suspension 30 mL  30 mL Oral Q4H PRN Jackelyn PolingJason A Berry, NP      . hydrOXYzine (ATARAX/VISTARIL) tablet 50 mg  50 mg Oral Q6H PRN Adonis BrookSheila Agustin, NP   50 mg at 04/08/16 1528  . lurasidone (LATUDA) tablet 40 mg  40 mg Oral Q breakfast Laveda AbbeLaurie Britton Parks, NP   40 mg at 04/08/16 0846  . magnesium hydroxide (MILK OF MAGNESIA) suspension 30 mL  30 mL Oral Daily PRN Jackelyn PolingJason A Berry, NP      .  sertraline (ZOLOFT) tablet 50 mg  50 mg Oral Daily Laveda Abbe, NP   50 mg at 04/08/16 0846  . traZODone (DESYREL) tablet 100 mg  100 mg Oral QHS PRN Adonis Brook, NP   100 mg at 04/07/16 2114   Lab Results:  No results found for this or any previous visit (from the past 48 hour(s)).  Blood Alcohol level:  Lab Results  Component Value Date   [facility] <5 04/04/2016   ETH <5 03/13/2016   Metabolic Disorder Labs: Lab Results  Component Value Date   HGBA1C 5.6 03/26/2016   MPG 114 03/26/2016   Lab Results  Component Value Date   PROLACTIN 5.8  03/26/2016   Lab Results  Component Value Date   CHOL 151 03/26/2016   TRIG 149 03/26/2016   HDL 40 (L) 03/26/2016   CHOLHDL 3.8 03/26/2016   VLDL 30 03/26/2016   LDLCALC 81 03/26/2016   Physical Findings: AIMS: Facial and Oral Movements Muscles of Facial Expression: None, normal Lips and Perioral Area: None, normal Jaw: None, normal Tongue: None, normal,Extremity Movements Upper (arms, wrists, hands, fingers): None, normal Lower (legs, knees, ankles, toes): None, normal, Trunk Movements Neck, shoulders, hips: None, normal, Overall Severity Severity of abnormal movements (highest score from questions above): None, normal Incapacitation due to abnormal movements: None, normal Patient's awareness of abnormal movements (rate only patient's report): No Awareness, Dental Status Current problems with teeth and/or dentures?: No Does patient usually wear dentures?: No  CIWA:  CIWA-Ar Total: 1 COWS:  COWS Total Score: 2  Musculoskeletal: Strength & Muscle Tone: within normal limits Gait & Station: normal Patient leans: N/A  Psychiatric Specialty Exam: Physical Exam  Nursing note and vitals reviewed.   Review of Systems  Musculoskeletal: Positive for myalgias.  Psychiatric/Behavioral: Positive for depression and substance abuse. The patient is nervous/anxious.   All other systems reviewed and are negative.   Blood pressure 110/65, pulse 88, temperature 98.6 F (37 C), temperature source Oral, resp. rate 18, height 5\' 6"  (1.676 m), weight 117.5 kg (259 lb), last menstrual period 03/27/2016, SpO2 100 %.Body mass index is 41.8 kg/m.  General Appearance: Guarded  Eye Contact:  Minimal  Speech:  Slow  Volume:  Decreased  Mood:  Anxious and Dysphoric  Affect:  Depressed  Thought Process:  Goal Directed and Descriptions of Associations: Circumstantial  Orientation:  Full (Time, Place, and Person)  Thought Content:  Rumination  Suicidal Thoughts:  No  Homicidal Thoughts:  No   Memory:  Immediate;   Fair Recent;   Fair Remote;   Fair  Judgement:  Impaired  Insight:  Shallow  Psychomotor Activity:  Decreased  Concentration:  Concentration: Fair and Attention Span: Fair  Recall:  [employer] of Knowledge:  Fair  Language:  Fair  Akathisia:  No  Handed:  Right  AIMS (if indicated):     Assets:  Desire for Improvement  ADL's:  Intact  Cognition:  WNL  Sleep:  Number of Hours: 6.5   Treatment Plan Summary: Patient today on droplet precaution due to flu like sx - pending blood work up, pt remains isolative , depressed - continue to encourage and treat.  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   Daily contact with patient to assess and evaluate symptoms and progress in treatment and Medication management   For depressive sx: Will continue Zoloft 50 mg po daily. Will continue  Latuda 40 mg po with meals.  For insomnia: Will continue Ambien 5 mg po qhs.  For anxiety/agitation PRN medications as per unit protocol.  For flu like sx: Tylenol 650 mg po prn. Completed Guaifenasin xr 600 mg po bid . Pending PCR viral panel, rapid strep test. Chest xray - unremarkable.  Reviewed past medical records,treatment plan.   Will continue to monitor vitals, medication compliance and treatment side effects while patient is here.   Will monitor for medical issues as well as call consult as needed.   Reviewed labs - tsh - low - will get t3, t4 , get rapid strep test.  CSW will continue working on disposition.   Patient to participate in therapeutic milieu .   Sanjuana Kava, NP, PMHNP, FNP-BC. 04/08/2016, 4:28 PMPatient ID: Amy Le, female   DOB: 1976/01/22, 41 y.o.   MRN: [id number]

## 2016-04-09 MED ORDER — TRAZODONE HCL 150 MG PO TABS: 150.0000 mg | ORAL_TABLET | Freq: Every evening | ORAL | 0 refills | Status: DC | PRN

## 2016-04-09 MED ORDER — TRAZODONE HCL 150 MG PO TABS: 150.0000 mg | ORAL_TABLET | Freq: Every evening | ORAL | Status: DC | PRN

## 2016-04-09 MED ORDER — LURASIDONE HCL 40 MG PO TABS: 40.0000 mg | ORAL_TABLET | Freq: Every day | ORAL | 0 refills | Status: DC

## 2016-04-09 MED ORDER — SERTRALINE HCL 50 MG PO TABS: 50.0000 mg | ORAL_TABLET | Freq: Every day | ORAL | 0 refills | Status: DC

## 2016-04-09 MED ORDER — HYDROXYZINE HCL 50 MG PO TABS: 50.0000 mg | ORAL_TABLET | Freq: Four times a day (QID) | ORAL | 0 refills | Status: DC | PRN

## 2016-04-09 NOTE — Progress Notes (Signed)
Nursing Progress Note 7p-7a  D) Patient presents with flat affect but is cooperative with Clinical research associatewriter. Patient denies SI/HI/AVH or pain. Patient contracts for safety at this time. Patient observed in the dayroom with peers watching television and having snacks. Patient requesting medications early stating "all I want to do is go to bed". When inquired, patient stated "i'm just tired".  A) Emotional support given. Patient medicated with PRN orders as prescribed. Medications reviewed with patient. Patient on q15 min safety checks. Opportunities for questions or concerns presented to patient. Patient encouraged to continue to work on treatment goals.  R) Patient receptive to interaction with nurse. Patient remains safe on the unit at this time. Patient is resting in bed without complaints. Will continue to monitor.

## 2016-04-09 NOTE — Progress Notes (Signed)
Recreation Therapy Notes  Date: 04/09/16 Time: 0930 Location: 300 Hall Group Room  Group Topic: Stress Management  Goal Area(s) Addresses:  Patient will verbalize importance of using healthy stress management.  Patient will identify positive emotions associated with healthy stress management.   Intervention: Stress Management  Activity :  Progressive Muscle Relaxation.  LRT introduced the stress management technique of progressive muscle relaxation.  LRT read script to allow patients to engage fully in the activity.  Patients were to follow along while LRT read script to participate in the technique.  Education:  Stress Management, Discharge Planning.   Education Outcome: Acknowledges edcuation/In group clarification offered/Needs additional education  Clinical Observations/Feedback: Pt did not attend group.   Saralynn Langhorst, LRT/CTRS         Elridge Stemm A 04/09/2016 11:53 AM 

## 2016-04-09 NOTE — Plan of Care (Signed)
Problem: Activity: Goal: Sleeping patterns will improve Outcome: Progressing Patient is sleeping in bed without issue or complaints. Respirations even and unlabored.  Problem: Medication: Goal: Compliance with prescribed medication regimen will improve Outcome: Progressing Patient taking medications as prescribed. Patient educated about medication regimen.

## 2016-04-09 NOTE — [facility] Suicide Risk Assessment (Signed)
[facility]   Principal Problem: Severe episode of recurrent major depressive disorder, without psychotic features [facility](HCC) Discharge Diagnoses:  Patient Active Problem List   Diagnosis Date Noted  . Severe episode of recurrent major depressive disorder, without psychotic features (HCC) [F33.2]   . PTSD (post-traumatic stress disorder) [F43.10] 03/25/2016  . 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 The patient is nervous/anxious (improving).   All other systems reviewed and are negative.   Blood pressure (!) 138/110, pulse 94, temperature 97.8 F (36.6 C), temperature source Oral, resp. rate 16, height 5\' 6"  (1.676 m), weight 117.5 kg (259 lb), last menstrual period 03/27/2016, SpO2 100 %.Body mass index is 41.8 kg/m.  General Appearance: Casual  Eye Contact::  Fair  Speech:  Clear and Coherent409  Volume:  Normal  Mood:  Euthymic  Affect:  Appropriate  Thought Process:  Goal Directed and Descriptions of Associations: Intact  Orientation:  Full (Time, Place, and Person)  Thought Content:  Logical  Suicidal Thoughts:  No  Homicidal Thoughts:  No  Memory:  Immediate;   Fair Recent;   Fair Remote;   Fair  Judgement:  Fair  Insight:  Fair  Psychomotor Activity:  Normal  Concentration:  Fair  Recall:  [employer]Fair  Fund of Knowledge:Fair  Language: Fair  Akathisia:  No  Handed:  Right  AIMS (if indicated):     Assets:  Communication Skills Desire for Improvement  Sleep:  Number of Hours: 6.75  Cognition: WNL  ADL's:  Intact   Mental Status Per Nursing Assessment::   On Admission:  Suicidal ideation indicated by patient  Demographic Factors:   NA  Loss Factors: NA  Historical Factors: Impulsivity  Risk Reduction Factors:   Positive therapeutic relationship  Continued Clinical Symptoms:  Alcohol/Substance Abuse/Dependencies Previous Psychiatric Diagnoses and Treatments  Cognitive Features That Contribute To Risk:  None    Suicide Risk:  Minimal: No identifiable suicidal ideation.  Patients presenting with no risk factors but with morbid ruminations; may be classified as minimal risk based on the severity of the depressive symptoms  Follow-up Information    [facility]   Specialty:  Behavioral Health Why:  Resume services with Transitional Care at discharge (contact person: Reggie).  Contact informationElpidio Eric: [address] [place] [address] [phone]        [facility] Follow up on 04/09/2016.   Why:  Patient accepted to this program for recovery housing. Can admit directly at discharge, will need to be at office at 2 PM on 1/26 for admission assessment.   Contact information: [address] [place], [place] [address] Phone: [phone] Fax: [phone]          Plan Of Care/Follow-up recommendations:  Activity:  no restrictions Diet:  regular Other:  follow up with aftercare  Kaius Daino, MD 04/09/2016, 11:30 AM

## 2016-04-09 NOTE — Discharge Summary (Signed)
Physician Discharge Summary Note  Patient:  Amy Le is an 41 y.o., female MRN:  [id number] DOB:  1976/01/31 Patient phone:  [phone] (home)  Patient address[place]   [address] [place] [address] [address] [place] [address],  Total Time spent with patient: 30 minutes  Date of Admission:  04/04/2016 Date of Discharge: 04/09/2016  Reason for Admission:  worsening depression, cocaine dependence and abuse  Principal Problem: Severe episode of recurrent major depressive disorder, without psychotic features [facility](HCC) Discharge Diagnoses: Patient Active Problem List   Diagnosis Date Noted  . Severe episode of recurrent major depressive disorder, without psychotic features (HCC) [F33.2]   . PTSD (post-traumatic stress disorder) [F43.10] 03/25/2016  . 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 Bipolar 1 disorder (HCC)   . BV (bacterial vaginosis)   . Chlamydia   . Trichomonas     Past Surgical History:  Procedure Laterality Date  . FOOT SURGERY    . MULTIPLE TOOTH EXTRACTIONS Bilateral 2017  . TUBAL LIGATION     Family History:  Family History  Problem Relation Age of Onset  . Mental illness Neg Hx    Family Psychiatric  History: see HPI Social History:  History  Alcohol Use No     History  Drug Use  . Types: "Crack" cocaine, Cocaine, Marijuana    Social History   Social History  . Marital status: Divorced    Spouse name: N/A  . Number of children: N/A  . Years of education: N/A   Social History Main Topics  . Smoking status: Current Every Day Smoker    Packs/day: 0.50    Types: Cigarettes  . Smokeless tobacco: Never Used  . Alcohol use No  . Drug use: Yes    Types: "Crack" cocaine, Cocaine, Marijuana  . Sexual activity: Yes    Birth control/ protection: Surgical   Other Topics Concern  . None    Social History Narrative  . None    Hospital Course:  Amy ClimesJacquetta Lanieris an 41 y.o.female. Pt reports SI with a plan to hang herself.  Pt reports daily crack cocaine use.  Amy Le was admitted for Severe episode of recurrent major depressive disorder, without psychotic features (HCC) and crisis management.  Patient was treated with medications with their indications listed below in detail under Medication List.  Medical problems were identified and treated as needed.  Home medications were restarted as appropriate.  Improvement was monitored by observation and Amy Le daily report of symptom reduction.  Emotional and mental status was monitored by daily self inventory reports completed by Amy Le and clinical staff.  Patient reported continued improvement, denied any new concerns.  Patient had been compliant on medications and denied side effects.  Support and encouragement was provided.    Amy Le was evaluated by the treatment team for stability and plans for continued recovery upon discharge.  Patient was offered further treatment options upon discharge including Residential, Intensive Outpatient and Outpatient treatment. Patient will follow up with agency listed below for medication management and counseling.  Encouraged patient to maintain satisfactory support network and home environment.  Advised to adhere to medication compliance and outpatient treatment follow up.  Prescriptions provided.       Amy Le motivation was an integral factor for scheduling further treatment.  Employment, transportation, bed availability, health status, family support, and any pending legal  issues were also considered during patient's hospital stay.  Upon completion of this admission the patient was both mentally and medically stable for discharge denying suicidal/homicidal ideation, auditory/visual/tactile hallucinations, delusional thoughts and paranoia.       Physical Findings: AIMS: Facial and Oral Movements Muscles of Facial Expression: None, normal Lips and Perioral Area: None, normal Jaw: None, normal Tongue: None, normal,Extremity Movements Upper (arms, wrists, hands, fingers): None, normal Lower (legs, knees, ankles, toes): None, normal, Trunk Movements Neck, shoulders, hips: None, normal, Overall Severity Severity of abnormal movements (highest score from questions above): None, normal Incapacitation due to abnormal movements: None, normal Patient's awareness of abnormal movements (rate only patient's report): No Awareness, Dental Status Current problems with teeth and/or dentures?: No Does patient usually wear dentures?: No  CIWA:  CIWA-Ar Total: 1 COWS:  COWS Total Score: 2  Musculoskeletal: Strength & Muscle Tone: within normal limits Gait & Station: normal Patient leans: N/A  Psychiatric Specialty Exam: Physical Exam  Nursing note and vitals reviewed.   ROS  Blood pressure (!) 138/110, pulse 94, temperature 97.8 F (36.6 C), temperature source Oral, resp. rate 16, height 5\' 6"  (1.676 m), weight 117.5 kg (259 lb), last menstrual period 03/27/2016, SpO2 100 %.Body mass index is 41.8 kg/m.    Have you used any form of tobacco in the last 30 days? (Cigarettes, Smokeless Tobacco, Cigars, and/or Pipes): Yes  Has this patient used any form of tobacco in the last 30 days? (Cigarettes, Smokeless Tobacco, Cigars, and/or Pipes) Yes, N/A  Blood Alcohol level:  Lab Results  Component Value Date   [facility] <5 04/04/2016   ETH <5 03/13/2016    Metabolic Disorder Labs:  Lab Results  Component Value Date   HGBA1C 5.6 03/26/2016   MPG 114 03/26/2016   Lab Results  Component Value Date   PROLACTIN 5.8 03/26/2016   Lab Results  Component Value Date   CHOL 151 03/26/2016   TRIG 149 03/26/2016   HDL 40 (L) 03/26/2016   CHOLHDL 3.8 03/26/2016   VLDL 30 03/26/2016   LDLCALC 81 03/26/2016    See Psychiatric Specialty Exam  and Suicide Risk Assessment completed by Attending Physician prior to discharge.  Discharge destination:  Home  Is patient on multiple antipsychotic therapies at discharge:  No   Has Patient had three or more failed trials of antipsychotic monotherapy by history:  No  Recommended Plan for Multiple Antipsychotic Therapies: NA   Allergies as of 04/09/2016   No Known Allergies     Medication List    STOP taking these medications   MINIPRESS PO   zolpidem 5 MG tablet Commonly known as:  AMBIEN     TAKE these medications     Indication  hydrOXYzine 50 MG tablet Commonly known as:  ATARAX/VISTARIL Take 1 tablet (50 mg total) by mouth every 6 (six) hours as needed for anxiety. What changed:  medication strength  how much to take  Indication:  Anxiety Neurosis, Anxiety   lurasidone 40 MG Tabs tablet Commonly known as:  LATUDA Take 1 tablet (40 mg total) by mouth daily with breakfast. Start taking on:  04/10/2016  Indication:  Depressive Phase of Manic-Depression   sertraline 50 MG tablet Commonly known as:  ZOLOFT Take 1 tablet (50 mg total) by mouth daily. Start taking on:  04/10/2016  Indication:  Major Depressive Disorder   traZODone 150 MG tablet Commonly known as:  DESYREL Take 1 tablet (150 mg total) by mouth at bedtime as needed for sleep.  Indication:  Trouble Sleeping      Follow-up Information    [facility] Follow up.   Specialty:  Behavioral Health Why:  Resume services with Transitional Care at discharge (contact person: Reggie).  Contact informationElpidio [address] [place] [address] [phone]        [facility] Follow up on 04/09/2016.   Why:  Patient accepted to this program for recovery housing. Can admit directly at discharge, will need to be at office at 2 PM on 1/26 for admission assessment.   Contact information: [address] [place], [place] [address] Phone: [phone] Fax: [phone]         Follow-up  recommendations:  Activity:  as tol Diet:  as tol  Comments:  1.  Take all your medications as prescribed.   2.  Report any adverse side effects to outpatient provider. 3.  Patient instructed to not use alcohol or illegal drugs while on prescription medicines. 4.  In the event of worsening symptoms, instructed patient to call 911, the crisis hotline or go to nearest emergency room for evaluation of symptoms.  Signed: Lindwood Qua, NP [facility] 04/09/2016, 12:01 PM

## 2016-04-09 NOTE — Progress Notes (Signed)
Patient to discharged ambulatory and stable to lobby. All discharge paperwork given and signed, valuables returned. Prescriptions given. Patient able to verbalize understanding. Patient stable, denies SI/HI/AVH. Patient given opportunity to express concerns and ask questions. Patient taking a blue [employer] cab to [facility].

## 2016-04-09 NOTE — Clinical Social Work Note (Signed)
Patient needs to call [facility] program; beds available, pt needs to complete phone screen w Helaine Chessracy Holt, program manager [phone])  Santa GeneraAnne Cunningham, LCSW Lead Clinical Social Worker Phone:  [phone]

## 2016-04-09 NOTE — Progress Notes (Signed)
Pt attend group. Her day was  6. Pt goal was to get her medicine but this did not happen. Pt said she did not meet her goal.

## 2016-04-09 NOTE — Tx Team (Signed)
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 acetaminophen (TYLENOL) tablet 650 mg  650 mg Oral Q6H PRN Jackelyn PolingJason A Berry, NP      . alum & mag hydroxide-simeth (MAALOX/MYLANTA) 200-200-20 MG/5ML suspension 30 mL  30 mL Oral Q4H PRN Jackelyn PolingJason A Berry, NP      . hydrOXYzine (ATARAX/VISTARIL) tablet 50 mg  50 mg Oral Q6H PRN Adonis BrookSheila Agustin, NP   50 mg at 04/08/16 2100  . lurasidone (LATUDA) tablet 40 mg  40 mg Oral Q breakfast Laveda AbbeLaurie Britton Parks, NP   40 mg at 04/09/16 1049  . magnesium hydroxide (MILK OF MAGNESIA) suspension 30 mL  30 mL Oral Daily PRN Jackelyn PolingJason A Berry, NP      . sertraline (ZOLOFT) tablet 50 mg  50 mg Oral Daily Laveda AbbeLaurie Britton Parks, NP   50 mg at 04/09/16 1049  . traZODone (DESYREL) tablet 150 mg  150 mg Oral QHS PRN Jomarie LongsSaramma Eappen, MD       PTA Medications: Prescriptions Prior to Admission  Medication Sig Dispense Refill Last Dose  . hydrOXYzine (ATARAX/VISTARIL) 25 MG tablet Take 1 tablet (25 mg total) by mouth every 6 (six) hours as needed for anxiety. (Patient taking differently: Take 25 mg by mouth 4 (four) times daily. ) 60 tablet 0 several days ago  . lurasidone (LATUDA) 40 MG TABS tablet Take 1 tablet (40 mg total) by mouth daily with breakfast. 30 tablet 0 several days ago  . Prazosin HCl (MINIPRESS PO) Take 1 tablet by mouth daily.   several days ago  . sertraline (ZOLOFT) 50 MG  tablet Take 1 tablet (50 mg total) by mouth daily. 30 tablet 0 unknown  . zolpidem (AMBIEN) 5 MG tablet Take 1 tablet (5 mg total) by mouth at bedtime as needed for sleep. (Patient taking differently: Take 5 mg by mouth at bedtime. ) 30 tablet 0 several days ago    Patient Stressors: Medication change or noncompliance Substance abuse  Patient Strengths: General fund of knowledge Motivation for treatment/growth  Treatment Modalities: Medication Management, Group therapy, Case management,  1 to 1 session with clinician, Psychoeducation, Recreational therapy.   Physician Treatment Plan for Primary Diagnosis: Severe episode of recurrent major depressive disorder, without psychotic features (HCC) Long Term Goal(s): Improvement in symptoms so as ready for discharge Improvement in symptoms so as ready for discharge   Short Term Goals: Ability to identify changes in lifestyle to reduce recurrence of condition will improve Ability to verbalize feelings will improve Ability to disclose and discuss suicidal ideas Ability to demonstrate self-control will improve Ability to identify changes in lifestyle to reduce recurrence of condition will improve Ability to verbalize feelings will improve Ability to identify and develop effective coping behaviors will improve Compliance with prescribed medications will improve Ability to identify triggers associated with substance abuse/mental health issues will improve  Medication Management: Evaluate patient's response, side effects, and tolerance of medication regimen.  Therapeutic Interventions: 1 to 1 sessions, Unit Group sessions and Medication administration.  Evaluation of Outcomes: Adequate for Discharge  Physician Treatment Plan for Secondary Diagnosis: Principal Problem:   Severe episode of recurrent major depressive disorder, without psychotic features (HCC) Active Problems:   Cocaine dependence with cocaine-induced psychotic disorder with  hallucinations (HCC)   PTSD (post-traumatic stress disorder)  Long Term Goal(s): Improvement in symptoms so as ready for discharge Improvement in symptoms so as ready for discharge   Short Term Goals: Ability to identify changes in lifestyle to reduce recurrence of condition will improve Ability to verbalize feelings will improve Ability to disclose and discuss suicidal ideas Ability to demonstrate self-control will improve Ability to identify changes in lifestyle to reduce recurrence of condition will improve Ability to verbalize feelings will improve Ability to identify and develop effective coping behaviors will improve Compliance with prescribed medications will improve Ability to identify triggers associated with substance abuse/mental health issues will improve     Medication Management: Evaluate patient's response, side effects, and tolerance of medication regimen.  Therapeutic Interventions: 1 to 1 sessions, Unit Group sessions and Medication administration.  Evaluation of Outcomes: Adequate for Discharge   RN Treatment Plan for Primary Diagnosis: Severe episode of recurrent major depressive disorder, without psychotic features (HCC) Long Term Goal(s): Knowledge of disease and therapeutic regimen to maintain health will improve  Short Term Goals: Ability to remain free from injury will improve, Ability to verbalize feelings will improve and Ability to disclose and discuss suicidal ideas  Medication Management: RN will administer medications as ordered by provider, will assess and evaluate patient's response and provide education to patient for prescribed medication. RN will report any adverse and/or side effects to prescribing provider.  Therapeutic Interventions: 1 on 1 counseling sessions, Psychoeducation, Medication administration, Evaluate responses to treatment, Monitor vital signs and CBGs as ordered, Perform/monitor CIWA, COWS, AIMS and Fall Risk screenings as ordered,  Perform wound care treatments as ordered.  Evaluation of Outcomes: Adequate for Discharge   LCSW Treatment Plan for Primary Diagnosis: Severe episode of recurrent major depressive disorder, without psychotic features (HCC) Long Term Goal(s): Safe transition to appropriate next level of care at discharge, Engage patient in therapeutic group addressing interpersonal concerns.  Short Term Goals: Engage patient in aftercare planning with referrals and resources, Facilitate patient progression through stages of change regarding substance use diagnoses and concerns and Identify triggers associated with mental health/substance abuse issues  Therapeutic Interventions: Assess for all discharge needs, 1 to 1 time with Social worker, Explore available resources and support systems, Assess for adequacy in community support network, Educate family and significant other(s) on suicide prevention, Complete Psychosocial Assessment, Interpersonal group therapy.  Evaluation of Outcomes: Adequate for Discharge   Progress in Treatment: Attending groups: Yes. Participating in groups: Yes. Taking medication as prescribed: Yes. Toleration medication: Yes. Family/Significant other contact made: No, will contact:  family member if patient consents Patient understands diagnosis: No. Minimal insight.  Discussing patient identified problems/goals with staff: Yes. Medical problems stabilized or resolved: Yes. Denies suicidal/homicidal ideation: Yes. Issues/concerns per patient self-inventory: No. Other: n/a  New problem(s) identified: No, Describe:  na/  New Short Term/Long Term Goal(s): detox; medication stabilization; development of comprehensive mental wellness/sobriety plan.   Discharge Plan or Barriers: Pt was recently discharged to [facility] and immediately relapsed. Has been connected to [facility] and follows up with that provider.  1/26;  Patient to admit to [facility], transitional recovery housing  program, today.  Transitional Care Team notified and will continue to follow.   Reason for Continuation of Hospitalization: Anxiety Depression Medication stabilization Suicidal ideation Withdrawal  symptoms  Estimated Length of Stay: 3-5 days ; will discharge today  Attendees: Patient: 04/09/2016 11:35 AM  Physician: Dr. Elna Breslow MD 04/09/2016 11:35 AM  Nursing: Karen Kitchens RN 04/09/2016 11:35 AM  RN Care Manager: Onnie Boer CM 04/09/2016 11:35 AM  Social Worker: Santa Genera LCSW 04/09/2016 11:35 AM  Recreational Therapist:  04/09/2016 11:35 AM  Other: Armandina Stammer NP; Hillery Jacks NP 04/09/2016 11:35 AM  Other:  04/09/2016 11:35 AM  Other: 04/09/2016 11:35 AM    Scribe for Treatment Team: Sallee Lange, LCSW 04/09/2016 11:35 AM

## 2016-04-09 NOTE — Progress Notes (Addendum)
  [facility] Discharge [facility] :  Will you be returning to the same living situation after discharge:  No. Will admit to [facility]. At discharge, do you have transportation home?: Yes,  will be given taxi voucher or No. Do you have the ability to pay for your medications: Yes,  has Medicaid  Release of information consent forms completed and in the chart;  Patient's signature needed at discharge.  Patient to Follow up at: Follow-up Information    [facility] Follow up.   Specialty:  Behavioral Health Why:  Resume services with Transitional Care at discharge (contact person: Reggie).  Contact informationElpidio Eric: [address] [place] [address] [phone]        [facility] Follow up on 04/09/2016.   Why:  Patient accepted to this program for recovery housing. Can admit directly at discharge, will need to be at office at 2 PM on 1/26 for admission assessment.   Contact information: [address] [place], [place] [address] Phone: [phone] Fax: [phone]          Next level of care provider [facility] Link:no  Safety Planning and Suicide Prevention discussed: Yes,  reviewed w patient, patient refused consent to speak w collateral contacts  Have you used any form of tobacco in the last 30 days? (Cigarettes, Smokeless Tobacco, Cigars, and/or Pipes): Yes  Has patient been referred to the Quitline?: Patient refused referral  Patient has been referred for addiction treatment: Yes  Amy Le 04/09/2016, 11:03 AM

## 2016-05-19 ENCOUNTER — Emergency Department (HOSPITAL_COMMUNITY)
Admission: EM | Admit: 2016-05-19 | Discharge: 2016-05-20 | Disposition: A | Discharge status: Home / Self Care | Payer: Medicaid Other | Attending: Emergency Medicine | Admitting: Emergency Medicine

## 2016-05-19 ENCOUNTER — Encounter (HOSPITAL_COMMUNITY): Payer: Self-pay

## 2016-05-19 DIAGNOSIS — N946 Dysmenorrhea, unspecified: Secondary | ICD-10-CM | POA: Diagnosis not present

## 2016-05-19 DIAGNOSIS — R45851 Suicidal ideations: Secondary | ICD-10-CM

## 2016-05-19 DIAGNOSIS — F1290 Cannabis use, unspecified, uncomplicated: Secondary | ICD-10-CM | POA: Diagnosis not present

## 2016-05-19 DIAGNOSIS — F17210 Nicotine dependence, cigarettes, uncomplicated: Secondary | ICD-10-CM | POA: Diagnosis not present

## 2016-05-19 DIAGNOSIS — F14251 Cocaine dependence with cocaine-induced psychotic disorder with hallucinations: Secondary | ICD-10-CM | POA: Insufficient documentation

## 2016-05-19 DIAGNOSIS — Z79899 Other long term (current) drug therapy: Secondary | ICD-10-CM | POA: Diagnosis not present

## 2016-05-19 DIAGNOSIS — F4310 Post-traumatic stress disorder, unspecified: Secondary | ICD-10-CM | POA: Diagnosis not present

## 2016-05-19 LAB — URINALYSIS, ROUTINE W REFLEX MICROSCOPIC
BILIRUBIN URINE: NEGATIVE
GLUCOSE, UA: NEGATIVE mg/dL
KETONES UR: NEGATIVE mg/dL
Nitrite: NEGATIVE
PH: 6 (ref 5.0–8.0)
Protein, ur: 100 mg/dL — AB
Specific Gravity, Urine: 1.01 (ref 1.005–1.030)

## 2016-05-19 LAB — CBC
HEMATOCRIT: 43.4 % (ref 36.0–46.0)
HEMOGLOBIN: 14.4 g/dL (ref 12.0–15.0)
MCH: 28.2 pg (ref 26.0–34.0)
MCHC: 33.2 g/dL (ref 30.0–36.0)
MCV: 84.9 fL (ref 78.0–100.0)
Platelets: 232 10*3/uL (ref 150–400)
RBC: 5.11 MIL/uL (ref 3.87–5.11)
RDW: 14.7 % (ref 11.5–15.5)
WBC: 6.3 10*3/uL (ref 4.0–10.5)

## 2016-05-19 LAB — COMPREHENSIVE METABOLIC PANEL
ALBUMIN: 4 g/dL (ref 3.5–5.0)
ALT: 11 U/L — AB (ref 14–54)
AST: 17 U/L (ref 15–41)
Alkaline Phosphatase: 70 U/L (ref 38–126)
Anion gap: 4 — ABNORMAL LOW (ref 5–15)
BUN: 10 mg/dL (ref 6–20)
CHLORIDE: 109 mmol/L (ref 101–111)
CO2: 27 mmol/L (ref 22–32)
CREATININE: 0.86 mg/dL (ref 0.44–1.00)
Calcium: 9.1 mg/dL (ref 8.9–10.3)
GFR calc non Af Amer: >60 mL/min (ref >60)
GLUCOSE: 101 mg/dL — AB (ref 65–99)
Potassium: 3.5 mmol/L (ref 3.5–5.1)
SODIUM: 140 mmol/L (ref 135–145)
Total Bilirubin: 0.6 mg/dL (ref 0.3–1.2)
Total Protein: 7.6 g/dL (ref 6.5–8.1)

## 2016-05-19 LAB — RAPID URINE DRUG SCREEN, HOSP PERFORMED
AMPHETAMINES: NOT DETECTED
Barbiturates: NOT DETECTED
Benzodiazepines: NOT DETECTED
COCAINE: POSITIVE — AB
OPIATES: NOT DETECTED
TETRAHYDROCANNABINOL: NOT DETECTED

## 2016-05-19 LAB — I-STAT BETA HCG BLOOD, ED (MC, WL, AP ONLY): I-stat hCG, quantitative: <5 m[IU]/mL (ref <5)

## 2016-05-19 LAB — LIPASE, BLOOD: LIPASE: 24 U/L (ref 11–51)

## 2016-05-19 LAB — ACETAMINOPHEN LEVEL

## 2016-05-19 LAB — ETHANOL: Alcohol, Ethyl (B): <5 mg/dL (ref <5)

## 2016-05-19 LAB — SALICYLATE LEVEL

## 2016-05-19 MED ORDER — ACETAMINOPHEN 325 MG PO TABS
650.0000 mg | ORAL_TABLET | Freq: Once | ORAL | Status: AC
  Administered 2016-05-19: 650 mg via ORAL
  Filled 2016-05-19: qty 2

## 2016-05-19 NOTE — ED Notes (Signed)
Patient pleasant and cooperative with care this shift. No distress noted on assessment. Pt denies suicidal ideations and verbally contracts for safety.

## 2016-05-19 NOTE — ED Notes (Signed)
Introduced self to patient. Pt oriented to unit expectations.  Assessed pt for:  A) Anxiety &/or agitation: On admission to the [facility] pt appears depressed. States that she feels like killing herself.   S) Safety: Safety maintained with q-15-minute checks and hourly rounds by staff.  A) ADLs: Pt able to perform ADLs independently.  P) Pick-Up (room cleanliness): Pt's room clean and free of clutter.

## 2016-05-19 NOTE — ED Notes (Signed)
[facility] Expected date:  Expected time:  Means of arrival:  Comments: Darl PikesLanier

## 2016-05-19 NOTE — ED Provider Notes (Signed)
[facility]-EMERGENCY DEPT Provider Note   CSN: [id number] Arrival date & time: 05/19/16  1506     History   Chief Complaint Chief Complaint  Patient presents with  . Suicidal  . Abdominal Pain    HPI Amy Le is a 41 y.o. female.  HPI    41 year old female presents today with complaints of suicidal ideations.  Patient notes that she is having difficulty dealing with the fact that her son is now living with his father.  Patient reports that she does not want to live anymore, does not have any specific plan.  No homicidal ideations.  Patient reports a history of the same.  Patient reports using alcohol and cocaine.  Patient reports she started having abdominal cramping last night and has started her menstrual cycle, she reports this is identical to previous no fever or any other concerning symptoms.     Past Medical History:  Diagnosis Date  . Bipolar 1 disorder (HCC)   . BV (bacterial vaginosis)   . Chlamydia   . Trichomonas     Patient Active Problem List   Diagnosis Date Noted  . Severe episode of recurrent major depressive disorder, without psychotic features (HCC)   . PTSD (post-traumatic stress disorder) 03/25/2016  . 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 FOOT SURGERY    . MULTIPLE TOOTH EXTRACTIONS Bilateral 2017  . TUBAL LIGATION      OB History    No data available       Home Medications    Prior to Admission medications   Medication Sig Start Date End Date Taking? Authorizing Provider  hydrOXYzine (ATARAX/VISTARIL) 50 MG tablet Take 1 tablet (50 mg total) by mouth every 6 (six) hours as needed for anxiety. Patient not taking: Reported on 05/19/2016 04/09/16   Adonis BrookSheila Agustin, NP  lurasidone (LATUDA) 40 MG TABS tablet Take 1 tablet (40 mg total) by mouth daily with breakfast. Patient not taking: Reported on 05/19/2016 04/10/16   Adonis BrookSheila  Agustin, NP  sertraline (ZOLOFT) 50 MG tablet Take 1 tablet (50 mg total) by mouth daily. Patient not taking: Reported on 05/19/2016 04/10/16   Adonis BrookSheila Agustin, NP  traZODone (DESYREL) 150 MG tablet Take 1 tablet (150 mg total) by mouth at bedtime as needed for sleep. Patient not taking: Reported on 05/19/2016 04/09/16   Adonis BrookSheila Agustin, NP    Family History Family History  Problem Relation Age of Onset  . Mental illness Neg Hx     Social History Social History  Substance Use Topics  . Smoking status: Current Every Day Smoker    Packs/day: 0.50    Types: Cigarettes  . Smokeless tobacco: Never Used  . Alcohol use No     Allergies   Patient has no known allergies.   Review of Systems Review of Systems  All other systems reviewed and are negative.    Physical Exam Updated Vital Signs BP 111/82 (BP Location: Right Arm)   Pulse 73   Temp 97.8 F (36.6 C) (Oral)   Resp 18   SpO2 100%   Physical Exam  Constitutional: She is oriented to person, place, and time. She appears well-developed and well-nourished.  HENT:  Head: Normocephalic and atraumatic.  Eyes: Conjunctivae are normal. Pupils are equal, round, and reactive to light. Right eye exhibits no discharge. Left eye exhibits no discharge. No scleral icterus.  Neck: Normal range of motion. No JVD present. No tracheal  deviation present.  Pulmonary/Chest: Effort normal. No stridor.  Abdominal: Soft. She exhibits no distension. There is no tenderness.  Neurological: She is alert and oriented to person, place, and time. Coordination normal.  Skin: Skin is warm.  Psychiatric: She has a normal mood and affect. Her behavior is normal. Judgment and thought content normal.  Nursing note and vitals reviewed.   ED Treatments / Results  Labs (all labs ordered are listed, but only abnormal results are displayed) Labs Reviewed  COMPREHENSIVE METABOLIC PANEL - Abnormal; Notable for the following:       Result Value   Glucose, Bld  101 (*)    ALT 11 (*)    Anion gap 4 (*)    All other components within normal limits  ACETAMINOPHEN LEVEL - Abnormal; Notable for the following:    Acetaminophen (Tylenol), Serum <10 (*)    All other components within normal limits  RAPID URINE DRUG SCREEN, HOSP PERFORMED - Abnormal; Notable for the following:    Cocaine POSITIVE (*)    All other components within normal limits  URINALYSIS, ROUTINE W REFLEX MICROSCOPIC - Abnormal; Notable for the following:    Color, Urine RED (*)    APPearance HAZY (*)    Hgb urine dipstick LARGE (*)    Protein, ur 100 (*)    Leukocytes, UA TRACE (*)    Bacteria, UA RARE (*)    Squamous Epithelial / LPF 0-5 (*)    All other components within normal limits  ETHANOL  SALICYLATE LEVEL  CBC  LIPASE, BLOOD  I-STAT BETA HCG BLOOD, ED ([facility], [facility], [facility] ONLY)    EKG  EKG Interpretation None       Radiology No results found.  Procedures Procedures (including critical care time)  Medications Ordered in ED Medications  acetaminophen (TYLENOL) tablet 650 mg (650 mg Oral Given 05/19/16 1712)     Initial Impression / Assessment and Plan / ED Course  I have reviewed the triage vital signs and the nursing notes.  Pertinent labs & imaging results that were available during my care of the patient were reviewed by me and considered in my medical decision making (see chart for details).      Final Clinical Impressions(s) / ED Diagnoses   Final diagnoses:  Suicidal ideation  Menstrual cramps    Labs: Psych screening labs  Imaging:  Consults:  Therapeutics:  Discharge Meds:   Assessment/Plan: 41 year old female presents today with suicidal ideations.  Patient also reports abdominal cramping typical of her menstrual cycle.  Patient is medically cleared and awaiting TTS evaluation.   New Prescriptions New Prescriptions   No medications on file     Eyvonne Mechanic, PA-C 05/19/16 1732    Alvira Monday, MD 05/20/16 1420
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
## 2016-05-20 DIAGNOSIS — Z79899 Other long term (current) drug therapy: Secondary | ICD-10-CM

## 2016-05-20 DIAGNOSIS — F4310 Post-traumatic stress disorder, unspecified: Secondary | ICD-10-CM

## 2016-05-20 DIAGNOSIS — F1290 Cannabis use, unspecified, uncomplicated: Secondary | ICD-10-CM | POA: Diagnosis not present

## 2016-05-20 DIAGNOSIS — F14251 Cocaine dependence with cocaine-induced psychotic disorder with hallucinations: Secondary | ICD-10-CM | POA: Diagnosis not present

## 2016-05-20 MED ORDER — IBUPROFEN 800 MG PO TABS
800.0000 mg | ORAL_TABLET | Freq: Once | ORAL | Status: AC
  Administered 2016-05-20: 800 mg via ORAL
  Filled 2016-05-20: qty 1

## 2016-05-20 NOTE — Discharge Instructions (Addendum)
For your ongoing mental health needs, you are advised to follow up with [facility].  New and returning patients are seen at their walk-in clinic.  Walk-in hours are Monday - Friday from 8:00 am - 3:00 pm.  Walk-in patients are seen on a first come, first served basis.  Try to arrive as early as possible for he best chance of being seen the same day: ° °     [facility] °     [address] °     [place], [place] [address] °     [phone] ° °For your shelter needs, contact the following service providers: ° °     [facility] (operated by [facility]) °     [address] °     [place], [place] [address] °     [phone] ° °     [facility] °     [address] °     [place], [place] [address] °     [phone] ° °For day shelter and other supportive services for the homeless, contact the [facility] ([facility]): ° °     [facility] °     [address] °     [place], [place] [address] °     [phone] ° °For transitional housing, contact one of the following agencies.  They provide longer term housing than a shelter, but there is an application process: ° °     [facility] °     [address] °     [place], [place] [address] °     [phone] ° °     [facility] °     [facility] °     [address] °     , [place] [address] °     [phone] °

## 2016-05-20 NOTE — BH Assessment (Signed)
[facility] Assessment Progress Note  Per Thedore MinsMojeed Akintayo, MD, this pt does not require psychiatric hospitalization at this time.  [facility].  She is also to be provided with information for supportive services for the homeless.  This has been included in pt's discharge instructions.  Pt's nurse, Morrie Sheldonshley, has been notified.  Doylene Canninghomas Allison Silva, MA Triage Specialist [phone]

## 2016-05-20 NOTE — [facility] Suicide Risk Assessment (Signed)
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 Severe episode of recurrent major depressive disorder, without psychotic features (HCC) [F33.2]   . PTSD (post-traumatic stress disorder) [F43.10] 03/25/2016    Total Time spent with patient: 45 minutes  Musculoskeletal: Strength & Muscle Tone: within normal limits Gait & Station: normal Patient leans: N/A  Psychiatric Specialty Exam: Physical Exam  Constitutional: She is oriented to person, place, and time. She appears well-developed and well-nourished.  HENT:  Head: Normocephalic.  Neck: Normal range of motion.  Musculoskeletal: Normal range of motion.  Neurological: She is alert and oriented to person, place, and time.  Psychiatric: She has a normal mood and affect. Her speech is normal and behavior is normal. Judgment and thought content normal. Cognition and memory are normal.    Review of Systems  Psychiatric/Behavioral: Positive for substance abuse.  All other systems reviewed and are negative.   Blood pressure 133/87, pulse 67, temperature 98 F (36.7 C), temperature source Oral, resp. rate 17, SpO2 96 %.There is no height or weight on file to calculate BMI.  General Appearance: Casual  Eye Contact:  Good  Speech:  Normal Rate  Volume:  Normal  Mood:  Euthymic  Affect:  Congruent  Thought Process:  Coherent and Descriptions of Associations: Intact  Orientation:  Full (Time, Place, and Person)  Thought Content:  WDL and Logical  Suicidal Thoughts:  No  Homicidal Thoughts:  No  Memory:  Immediate;   Good Recent;   Good Remote;   Good  Judgement:  Fair  Insight:  Fair   Psychomotor Activity:  Normal  Concentration:  Concentration: Good and Attention Span: Good  Recall:  Good  Fund of Knowledge:  Fair  Language:  Good  Akathisia:  No  Handed:  Right  AIMS (if indicated):     Assets:  Leisure Time Physical Health Resilience  ADL's:  Intact  Cognition:  WNL  Sleep:       Mental Status Per Nursing Assessment::   On Admission:   cocaine abuse with suicidal ideations  Demographic Factors:  NA  Loss Factors: NA  Historical Factors: NA  Risk Reduction Factors:   Sense of responsibility to family  Continued Clinical Symptoms:  NOne  Cognitive Features That Contribute To Risk:  None    Suicide Risk:  Minimal: No identifiable suicidal ideation.  Patients presenting with no risk factors but with morbid ruminations; may be classified as minimal risk based on the severity of the depressive symptoms    Plan Of Care/Follow-up recommendations:  Activity:  as tolerated Diet:  heart healhty diet  LORD, JAMISON, NP 05/20/2016, 12:04 PM

## 2016-05-20 NOTE — ED Notes (Signed)
Pt d/c home per MD order. Discharge summary reviewed with pt.  Pt verbalizes understanding of discharge summary. Not endorsing SI/HI. Bus pass provided per pt request. Pt signed for personal property, property returned. Pt signed e-signature. Ambulatory off unit with [facility].

## 2016-05-20 NOTE — ED Notes (Signed)
Pt requesting a taxi voucher, reports to this nurse that she received one last time she was here, this nurse notified social worker, informed taxi voucher not available at this time and can offer pt a bus pass, this nurse will offer bus pass at discharge,.

## 2016-05-20 NOTE — Consult Note (Signed)
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 Severe episode of recurrent major depressive disorder, without psychotic features ([place]) [F33.2]   . PTSD (post-traumatic stress disorder) [F43.10] 03/25/2016    Total Time spent with patient: 45 minutes  Subjective:   Amy Le is a 41 y.o. female patient does not warrant admission.  HPI:  40 yo female who presented to the ED after using cocaine and having suicidal ideations.  She was at [facility] three times in less than a month, last discharge was the end of January.  She did not go to her follow-up appointments.  Today, she is clear and coherent with no suicidal/homicidal ideations, hallucinations, or withdrawal symptoms.  Stable for discharge  Past Psychiatric History: cocaine abuse, depression  Risk to Self: None Risk to Others: Homicidal Ideation: No Thoughts of Harm to Others: No Current Homicidal Intent: No Current Homicidal Plan: No Access to Homicidal Means: No History of harm to others?: No Assessment of Violence: None Noted Does patient have access to weapons?: No Criminal Charges Pending?: No Does patient have a court date: No Prior Inpatient Therapy: Prior Inpatient Therapy: Yes Prior Therapy Dates: multiple admissions Prior Therapy Facilty/Provider(s): [facility] Reason for Treatment: substance abuse; depression Prior Outpatient Therapy: Prior Outpatient Therapy: Yes Prior Therapy Facilty/Provider(s): [facility] Reason for Treatment: Bipolar 1 Does patient have an ACCT team?: No Does patient  have Intensive In-House Services?  : No Does patient have [facility] services? : No Does patient have P4CC services?: No  Past Medical History:  Past Medical History:  Diagnosis Date  . Bipolar 1 disorder ([place])   . BV (bacterial vaginosis)   . Chlamydia   . Trichomonas     Past Surgical History:  Procedure Laterality Date  . FOOT SURGERY    . MULTIPLE TOOTH EXTRACTIONS Bilateral 2017  . TUBAL LIGATION     Family History:  Family History  Problem Relation Age of Onset  . Mental illness Neg Hx    Family Psychiatric  History: none Social History:  History  Alcohol Use No     History  Drug Use  . Types: "Crack" cocaine, Cocaine, Marijuana    Social History   Social History  . Marital status: Divorced    Spouse name: N/A  . Number of children: N/A  . Years of education: N/A   Social History Main Topics  . Smoking status: Current Every Day Smoker    Packs/day: 0.50    Types: Cigarettes  . Smokeless tobacco: Never Used  . Alcohol use No  . Drug use: Yes    Types: "Crack" cocaine, Cocaine, Marijuana  . Sexual activity: Yes    Birth control/ protection: Surgical   Other Topics Concern  . None   Social History Narrative  . None   Additional Social History:    Allergies:  No Known Allergies  Labs:  Results for orders placed or performed during the hospital encounter of 05/19/16 (from the past 48 hour(s))  Rapid urine drug screen (hospital performed)     Status: Abnormal   Collection Time: 05/19/16  3:27 PM  Result Value Ref Range   Opiates NONE DETECTED NONE  DETECTED   Cocaine POSITIVE (A) NONE DETECTED   Benzodiazepines NONE DETECTED NONE DETECTED   Amphetamines NONE DETECTED NONE DETECTED   Tetrahydrocannabinol NONE DETECTED NONE DETECTED   Barbiturates NONE DETECTED NONE DETECTED    Comment:        DRUG SCREEN FOR MEDICAL PURPOSES ONLY.  IF CONFIRMATION IS NEEDED FOR ANY PURPOSE, NOTIFY LAB WITHIN 5 DAYS.        LOWEST DETECTABLE LIMITS FOR URINE  DRUG SCREEN Drug Class       Cutoff (ng/mL) Amphetamine      1000 Barbiturate      200 Benzodiazepine   010 Tricyclics       932 Opiates          300 Cocaine          300 THC              50   Urinalysis, Routine w reflex microscopic     Status: Abnormal   Collection Time: 05/19/16  3:27 PM  Result Value Ref Range   Color, Urine RED (A) YELLOW    Comment: BIOCHEMICALS MAY BE AFFECTED BY COLOR   APPearance HAZY (A) CLEAR   Specific Gravity, Urine 1.010 1.005 - 1.030   pH 6.0 5.0 - 8.0   Glucose, UA NEGATIVE NEGATIVE mg/dL   Hgb urine dipstick LARGE (A) NEGATIVE   Bilirubin Urine NEGATIVE NEGATIVE   Ketones, ur NEGATIVE NEGATIVE mg/dL   Protein, ur 100 (A) NEGATIVE mg/dL   Nitrite NEGATIVE NEGATIVE   Leukocytes, UA TRACE (A) NEGATIVE   RBC / HPF TOO NUMEROUS TO COUNT 0 - 5 RBC/hpf   WBC, UA 6-30 0 - 5 WBC/hpf   Bacteria, UA RARE (A) NONE SEEN   Squamous Epithelial / LPF 0-5 (A) NONE SEEN   Mucous PRESENT   Comprehensive metabolic panel     Status: Abnormal   Collection Time: 05/19/16  4:07 PM  Result Value Ref Range   Sodium 140 135 - 145 mmol/L   Potassium 3.5 3.5 - 5.1 mmol/L   Chloride 109 101 - 111 mmol/L   CO2 27 22 - 32 mmol/L   Glucose, Bld 101 (H) 65 - 99 mg/dL   BUN 10 6 - 20 mg/dL   Creatinine, Ser 0.86 0.44 - 1.00 mg/dL   Calcium 9.1 8.9 - 10.3 mg/dL   Total Protein 7.6 6.5 - 8.1 g/dL   Albumin 4.0 3.5 - 5.0 g/dL   AST 17 15 - 41 U/L   ALT 11 (L) 14 - 54 U/L   Alkaline Phosphatase 70 38 - 126 U/L   Total Bilirubin 0.6 0.3 - 1.2 mg/dL   GFR calc non Af Amer >60 >60 mL/min   GFR calc Af Amer >60 >60 mL/min    Comment: (NOTE) The eGFR has been calculated using the CKD EPI equation. This calculation has not been validated in all clinical situations. eGFR's persistently <60 mL/min signify possible Chronic Kidney Disease.    Anion gap 4 (L) 5 - 15  Ethanol     Status: None   Collection Time: 05/19/16  4:07 PM  Result Value Ref Range   Alcohol, Ethyl (B)  <5 <5 mg/dL    Comment:        LOWEST DETECTABLE LIMIT FOR SERUM ALCOHOL IS 5 mg/dL FOR MEDICAL PURPOSES ONLY   Salicylate level     Status: None   Collection Time: 05/19/16  4:07 PM  Result Value Ref Range   Salicylate Lvl <3.5 2.8 -  30.0 mg/dL  Acetaminophen level     Status: Abnormal   Collection Time: 05/19/16  4:07 PM  Result Value Ref Range   Acetaminophen (Tylenol), Serum <10 (L) 10 - 30 ug/mL    Comment:        THERAPEUTIC CONCENTRATIONS VARY SIGNIFICANTLY. A RANGE OF 10-30 ug/mL MAY BE AN EFFECTIVE CONCENTRATION FOR MANY PATIENTS. HOWEVER, SOME ARE BEST TREATED AT CONCENTRATIONS OUTSIDE THIS RANGE. ACETAMINOPHEN CONCENTRATIONS >150 ug/mL AT 4 HOURS AFTER INGESTION AND >50 ug/mL AT 12 HOURS AFTER INGESTION ARE OFTEN ASSOCIATED WITH TOXIC REACTIONS.   cbc     Status: None   Collection Time: 05/19/16  4:07 PM  Result Value Ref Range   WBC 6.3 4.0 - 10.5 K/uL   RBC 5.11 3.87 - 5.11 MIL/uL   Hemoglobin 14.4 12.0 - 15.0 g/dL   HCT 43.4 36.0 - 46.0 %   MCV 84.9 78.0 - 100.0 fL   MCH 28.2 26.0 - 34.0 pg   MCHC 33.2 30.0 - 36.0 g/dL   RDW 14.7 11.5 - 15.5 %   Platelets 232 150 - 400 K/uL  Lipase, blood     Status: None   Collection Time: 05/19/16  4:07 PM  Result Value Ref Range   Lipase 24 11 - 51 U/L  I-Stat beta hCG blood, ED     Status: None   Collection Time: 05/19/16  4:17 PM  Result Value Ref Range   I-stat hCG, quantitative <5.0 <5 mIU/mL   Comment 3            Comment:   GEST. AGE      CONC.  (mIU/mL)   <=1 WEEK        5 - 50     2 WEEKS       50 - 500     3 WEEKS       100 - 10,000     4 WEEKS     1,000 - 30,000        FEMALE AND NON-PREGNANT FEMALE:     LESS THAN 5 mIU/mL     No current facility-administered medications for this encounter.    Current Outpatient Prescriptions  Medication Sig Dispense Refill  . hydrOXYzine (ATARAX/VISTARIL) 50 MG tablet Take 1 tablet (50 mg total) by mouth every 6 (six) hours as needed for anxiety. (Patient not  taking: Reported on 05/19/2016) 30 tablet 0  . lurasidone (LATUDA) 40 MG TABS tablet Take 1 tablet (40 mg total) by mouth daily with breakfast. (Patient not taking: Reported on 05/19/2016) 30 tablet 0  . sertraline (ZOLOFT) 50 MG tablet Take 1 tablet (50 mg total) by mouth daily. (Patient not taking: Reported on 05/19/2016) 30 tablet 0  . traZODone (DESYREL) 150 MG tablet Take 1 tablet (150 mg total) by mouth at bedtime as needed for sleep. (Patient not taking: Reported on 05/19/2016) 30 tablet 0    Musculoskeletal: Strength & Muscle Tone: within normal limits Gait & Station: normal Patient leans: N/A  Psychiatric Specialty Exam: Physical Exam  Constitutional: She is oriented to person, place, and time. She appears well-developed and well-nourished.  HENT:  Head: Normocephalic.  Neck: Normal range of motion.  Musculoskeletal: Normal range of motion.  Neurological: She is alert and oriented to person, place, and time.  Psychiatric: She has a normal mood and affect. Her speech is normal and behavior is normal. Judgment and thought content normal. Cognition and memory are normal.    Review of Systems  Psychiatric/Behavioral: Positive for  substance abuse.  All other systems reviewed and are negative.   Blood pressure 133/87, pulse 67, temperature 98 F (36.7 C), temperature source Oral, resp. rate 17, SpO2 96 %.There is no height or weight on file to calculate BMI.  General Appearance: Casual  Eye Contact:  Good  Speech:  Normal Rate  Volume:  Normal  Mood:  Euthymic  Affect:  Congruent  Thought Process:  Coherent and Descriptions of Associations: Intact  Orientation:  Full (Time, Place, and Person)  Thought Content:  WDL and Logical  Suicidal Thoughts:  No  Homicidal Thoughts:  No  Memory:  Immediate;   Good Recent;   Good Remote;   Good  Judgement:  Fair  Insight:  Fair  Psychomotor Activity:  Normal  Concentration:  Concentration: Good and Attention Span: Good  Recall:  Good   Fund of Knowledge:  Fair  Language:  Good  Akathisia:  No  Handed:  Right  AIMS (if indicated):     Assets:  Leisure Time Physical Health Resilience  ADL's:  Intact  Cognition:  WNL  Sleep:        Treatment Plan Summary: Daily contact with patient to assess and evaluate symptoms and progress in treatment and Medication management Cocaine abuse with cocaine induced mood disorder: -Crisis stabilization -Medication management:  NOne started as she needed to clear the cocaine -Individual and substance abuse ounseling  Disposition: No evidence of imminent risk to self or others at present.    Waylan Boga, NP 05/20/2016 10:17 AM  Patient seen face-to-face for psychiatric evaluation, chart reviewed and case discussed with the physician extender and developed treatment plan. Reviewed the information documented and agree with the treatment plan. Corena Pilgrim, MD

## 2016-06-06 ENCOUNTER — Emergency Department (HOSPITAL_COMMUNITY): Payer: Medicaid Other

## 2016-06-06 ENCOUNTER — Encounter (HOSPITAL_COMMUNITY): Payer: Self-pay

## 2016-06-06 ENCOUNTER — Emergency Department (HOSPITAL_COMMUNITY)
Admission: EM | Admit: 2016-06-06 | Discharge: 2016-06-08 | Disposition: A | Discharge status: Other Acute Inpatient Hospital | Payer: Medicaid Other | Attending: Emergency Medicine | Admitting: Emergency Medicine

## 2016-06-06 DIAGNOSIS — A5909 Other urogenital trichomoniasis: Secondary | ICD-10-CM | POA: Diagnosis not present

## 2016-06-06 DIAGNOSIS — R45851 Suicidal ideations: Secondary | ICD-10-CM | POA: Diagnosis present

## 2016-06-06 DIAGNOSIS — R0789 Other chest pain: Secondary | ICD-10-CM | POA: Diagnosis not present

## 2016-06-06 DIAGNOSIS — Z79899 Other long term (current) drug therapy: Secondary | ICD-10-CM | POA: Diagnosis not present

## 2016-06-06 DIAGNOSIS — F1410 Cocaine abuse, uncomplicated: Secondary | ICD-10-CM | POA: Insufficient documentation

## 2016-06-06 DIAGNOSIS — F17210 Nicotine dependence, cigarettes, uncomplicated: Secondary | ICD-10-CM | POA: Insufficient documentation

## 2016-06-06 LAB — I-STAT BETA HCG BLOOD, ED (MC, WL, AP ONLY): I-stat hCG, quantitative: <5 m[IU]/mL (ref <5)

## 2016-06-06 LAB — I-STAT TROPONIN, ED
TROPONIN I, POC: 0 ng/mL (ref 0.00–0.08)
Troponin i, poc: 0 ng/mL (ref 0.00–0.08)

## 2016-06-06 LAB — COMPREHENSIVE METABOLIC PANEL
ALT: 11 U/L — AB (ref 14–54)
AST: 22 U/L (ref 15–41)
Albumin: 3.9 g/dL (ref 3.5–5.0)
Alkaline Phosphatase: 70 U/L (ref 38–126)
Anion gap: 11 (ref 5–15)
BUN: 7 mg/dL (ref 6–20)
CHLORIDE: 103 mmol/L (ref 101–111)
CO2: 24 mmol/L (ref 22–32)
CREATININE: 0.75 mg/dL (ref 0.44–1.00)
Calcium: 9.1 mg/dL (ref 8.9–10.3)
GFR calc non Af Amer: >60 mL/min (ref >60)
Glucose, Bld: 95 mg/dL (ref 65–99)
POTASSIUM: 3.3 mmol/L — AB (ref 3.5–5.1)
SODIUM: 138 mmol/L (ref 135–145)
Total Bilirubin: 0.6 mg/dL (ref 0.3–1.2)
Total Protein: 7.3 g/dL (ref 6.5–8.1)

## 2016-06-06 LAB — RAPID URINE DRUG SCREEN, HOSP PERFORMED
AMPHETAMINES: NOT DETECTED
Barbiturates: NOT DETECTED
Benzodiazepines: NOT DETECTED
COCAINE: POSITIVE — AB
OPIATES: NOT DETECTED
TETRAHYDROCANNABINOL: NOT DETECTED

## 2016-06-06 LAB — CBC
HCT: 42.6 % (ref 36.0–46.0)
HEMOGLOBIN: 13.9 g/dL (ref 12.0–15.0)
MCH: 28 pg (ref 26.0–34.0)
MCHC: 32.6 g/dL (ref 30.0–36.0)
MCV: 85.9 fL (ref 78.0–100.0)
PLATELETS: 249 10*3/uL (ref 150–400)
RBC: 4.96 MIL/uL (ref 3.87–5.11)
RDW: 15.4 % (ref 11.5–15.5)
WBC: 8.7 10*3/uL (ref 4.0–10.5)

## 2016-06-06 LAB — ACETAMINOPHEN LEVEL: Acetaminophen (Tylenol), Serum: <10 ug/mL — ABNORMAL LOW (ref 10–30)

## 2016-06-06 LAB — ETHANOL

## 2016-06-06 LAB — SALICYLATE LEVEL

## 2016-06-06 NOTE — ED Notes (Signed)
Dr. J at bedside 

## 2016-06-06 NOTE — ED Notes (Signed)
Pt ambulated to BR with Student RN, pt became irate when told she needed to have staff with her in BR d/t SI. Pt yelling and cursing at staff. Pt ambulated back to room stating "don't come in my fucking room". Pt went back into her room, threw away trash and got onto stretcher.  Dr. Shela CommonsJ ask to have pelvic set up to address vaginal itching concerns.

## 2016-06-06 NOTE — ED Notes (Signed)
Pt also reporting suicidal thoughts since last night, stating she would cut her wrists.

## 2016-06-06 NOTE — ED Notes (Signed)
PER EMS: pt arrives via EMS, sent to triage on arrival. Pt reports CP/SOB, and lightheadedness for 2 weeks secondary to cocaine use, per EMS. Pt requesting detox for cocaine, last used "$100 worth last night." BP-121/81, HR-80 sinus, 98% RA

## 2016-06-06 NOTE — ED Provider Notes (Addendum)
[facility]-EMERGENCY DEPT Provider Note   CSN: [id number] Arrival date & time: 06/06/16  1941     History   Chief Complaint Chief Complaint  Patient presents with  . Chest Pain  . Suicidal  Level V caveat psychiatric complaint  HPI Amy Le is a 41 y.o. female.Patient complains of chest pain anterior intermittent for the past 2 weeks lasts 2-3 minutes at a time. Nonexertional. No associated shortness of breath nausea or sweatiness. She also reports feeling suicidal for the past several weeks. She admits to smoking crack last time last night. She states her plan for suicide is to cut her wrists. She has been off of all of her psychiatric medications. She hallucinates that she hears airplanes coming close to her. She also reports that she is paranoid of other people around her.  HPI  Past Medical History:  Diagnosis Date  . Bipolar 1 disorder ([facility])   . BV (bacterial vaginosis)   . Chlamydia   . Trichomonas     Patient Active Problem List   Diagnosis Date Noted  . Severe episode of recurrent major depressive disorder, without psychotic features ([facility])   . PTSD (post-traumatic stress disorder) 03/25/2016  . 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 FOOT SURGERY    . MULTIPLE TOOTH EXTRACTIONS Bilateral 2017  . TUBAL LIGATION      OB History    No data available       Home Medications    Prior to Admission medications   Medication Sig Start Date End Date Taking? Authorizing Provider  hydrOXYzine (ATARAX/VISTARIL) 50 MG tablet Take 1 tablet (50 mg total) by mouth every 6 (six) hours as needed for anxiety. Patient not taking: Reported on 05/19/2016 04/09/16   Adonis Brook, NP  lurasidone (LATUDA) 40 MG TABS tablet Take 1 tablet (40 mg total) by mouth daily with breakfast. Patient not taking: Reported on 05/19/2016 04/10/16   Adonis Brook, NP  sertraline  (ZOLOFT) 50 MG tablet Take 1 tablet (50 mg total) by mouth daily. Patient not taking: Reported on 05/19/2016 04/10/16   Adonis Brook, NP  traZODone (DESYREL) 150 MG tablet Take 1 tablet (150 mg total) by mouth at bedtime as needed for sleep. Patient not taking: Reported on 05/19/2016 04/09/16   Adonis Brook, NP    Family History Family History  Problem Relation Age of Onset  . Mental illness Neg Hx     Social History Social History  Substance Use Topics  . Smoking status: Current Every Day Smoker    Packs/day: 0.50    Types: Cigarettes  . Smokeless tobacco: Never Used  . Alcohol use No     Allergies   Patient has no known allergies.   Review of Systems Review of Systems  Unable to perform ROS: Psychiatric disorder  Cardiovascular: Positive for chest pain.  Genitourinary:       Vaginal itching since tonight  Psychiatric/Behavioral: Positive for dysphoric mood, hallucinations and suicidal ideas. The patient is nervous/anxious.      Physical Exam Updated Vital Signs BP 127/87 (BP Location: Right Arm)   Pulse 98   Temp 98.8 F (37.1 C) (Oral)   Resp 16   LMP 05/23/2016   SpO2 100%   Physical Exam  Constitutional: She appears well-developed and well-nourished.  HENT:  Head: Normocephalic and atraumatic.  Eyes: Conjunctivae are normal. Pupils are equal, round, and reactive to light.  Neck: Neck supple. No  tracheal deviation present. No thyromegaly present.  Cardiovascular: Normal rate and regular rhythm.   No murmur heard. Pulmonary/Chest: Effort normal and breath sounds normal.  Abdominal: Soft. Bowel sounds are normal. She exhibits no distension. There is no tenderness.  Genitourinary:  Genitourinary Comments: Pelvic exam no external lesion. Copious beige discharge. Cervical os closed. Positive cervical motion tenderness cervix appears friable. No adnexal masses or tenderness.  Musculoskeletal: Normal range of motion. She exhibits no edema or tenderness.    Neurological: She is alert. Coordination normal.  Skin: Skin is warm and dry. No rash noted.  Psychiatric: She has a normal mood and affect.  Nursing note and vitals reviewed.    ED Treatments / Results  Labs (all labs ordered are listed, but only abnormal results are displayed) Labs Reviewed  COMPREHENSIVE METABOLIC PANEL - Abnormal; Notable for the following:       Result Value   Potassium 3.3 (*)    ALT 11 (*)    All other components within normal limits  ACETAMINOPHEN LEVEL - Abnormal; Notable for the following:    Acetaminophen (Tylenol), Serum <10 (*)    All other components within normal limits  RAPID URINE DRUG SCREEN, HOSP PERFORMED - Abnormal; Notable for the following:    Cocaine POSITIVE (*)    All other components within normal limits  ETHANOL  SALICYLATE LEVEL  CBC  I-STAT TROPOININ, ED  I-STAT BETA HCG BLOOD, ED ([facility], [facility], [facility] ONLY)    EKG  EKG Interpretation  Date/Time:  Sunday June 06 2016 20:34:15 EDT Ventricular Rate:  78 PR Interval:    QRS Duration: 94 QT Interval:  411 QTC Calculation: 469 R Axis:   83 Text Interpretation:  Sinus rhythm Borderline short PR interval Left ventricular hypertrophy No significant change since last tracing Confirmed by Ethelda ChickJACUBOWITZ  MD, Joyce Leckey [id number] on 06/06/2016 9:43:18 PM       Radiology Dg Chest 2 View  Result Date: 06/06/2016 CLINICAL DATA:  Chest pain EXAM: CHEST  2 VIEW COMPARISON:  04/04/2016 FINDINGS: Cardiomediastinal silhouette is unremarkable. No infiltrate or pleural effusion. No pulmonary edema. Mild levoscoliosis upper thoracic spine. There is dextroscoliosis lower thoracic and upper lumbar spine. IMPRESSION: No active cardiopulmonary disease. Thoracic scoliosis as described above. Electronically Signed   By: Natasha MeadLiviu  Pop M.D.   On: 06/06/2016 21:34   Results for orders placed or performed during the hospital encounter of 06/06/16  Comprehensive metabolic panel  Result Value Ref Range   Sodium 138 135 - 145  mmol/L   Potassium 3.3 (L) 3.5 - 5.1 mmol/L   Chloride 103 101 - 111 mmol/L   CO2 24 22 - 32 mmol/L   Glucose, Bld 95 65 - 99 mg/dL   BUN 7 6 - 20 mg/dL   Creatinine, Ser 6.040.75 0.44 - 1.00 mg/dL   Calcium 9.1 8.9 - 54.010.3 mg/dL   Total Protein 7.3 6.5 - 8.1 g/dL   Albumin 3.9 3.5 - 5.0 g/dL   AST 22 15 - 41 U/L   ALT 11 (L) 14 - 54 U/L   Alkaline Phosphatase 70 38 - 126 U/L   Total Bilirubin 0.6 0.3 - 1.2 mg/dL   GFR calc non Af Amer >60 >60 mL/min   GFR calc Af Amer >60 >60 mL/min   Anion gap 11 5 - 15  Ethanol  Result Value Ref Range   Alcohol, Ethyl (B) <5 <5 mg/dL  Salicylate level  Result Value Ref Range   Salicylate Lvl <7.0 2.8 - 30.0 mg/dL  Acetaminophen level  Result Value Ref Range   Acetaminophen (Tylenol), Serum <10 (L) 10 - 30 ug/mL  cbc  Result Value Ref Range   WBC 8.7 4.0 - 10.5 K/uL   RBC 4.96 3.87 - 5.11 MIL/uL   Hemoglobin 13.9 12.0 - 15.0 g/dL   HCT 41.6 60.6 - 30.1 %   MCV 85.9 78.0 - 100.0 fL   MCH 28.0 26.0 - 34.0 pg   MCHC 32.6 30.0 - 36.0 g/dL   RDW 60.1 09.3 - 23.5 %   Platelets 249 150 - 400 K/uL  Rapid urine drug screen (hospital performed)  Result Value Ref Range   Opiates NONE DETECTED NONE DETECTED   Cocaine POSITIVE (A) NONE DETECTED   Benzodiazepines NONE DETECTED NONE DETECTED   Amphetamines NONE DETECTED NONE DETECTED   Tetrahydrocannabinol NONE DETECTED NONE DETECTED   Barbiturates NONE DETECTED NONE DETECTED  I-stat troponin, ED  Result Value Ref Range   Troponin i, poc 0.00 0.00 - 0.08 ng/mL   Comment 3          I-Stat beta hCG blood, ED  Result Value Ref Range   I-stat hCG, quantitative <5.0 <5 mIU/mL   Comment 3          I-stat troponin, ED  Result Value Ref Range   Troponin i, poc 0.00 0.00 - 0.08 ng/mL   Comment 3          Chest x-ray viewed by me Dg Chest 2 View  Result Date: 06/06/2016 CLINICAL DATA:  Chest pain EXAM: CHEST  2 VIEW COMPARISON:  04/04/2016 FINDINGS: Cardiomediastinal silhouette is unremarkable. No  infiltrate or pleural effusion. No pulmonary edema. Mild levoscoliosis upper thoracic spine. There is dextroscoliosis lower thoracic and upper lumbar spine. IMPRESSION: No active cardiopulmonary disease. Thoracic scoliosis as described above. Electronically Signed   By: Natasha Mead M.D.   On: 06/06/2016 21:34   Procedures Procedures (including critical care time)  Medications Ordered in ED Medications - No data to display  Results for orders placed or performed during the hospital encounter of 06/06/16  Wet prep, genital  Result Value Ref Range   Yeast Wet Prep HPF POC NONE SEEN NONE SEEN   Trich, Wet Prep PRESENT (A) NONE SEEN   Clue Cells Wet Prep HPF POC PRESENT (A) NONE SEEN   WBC, Wet Prep HPF POC MANY (A) NONE SEEN   Sperm NONE SEEN   Comprehensive metabolic panel  Result Value Ref Range   Sodium 138 135 - 145 mmol/L   Potassium 3.3 (L) 3.5 - 5.1 mmol/L   Chloride 103 101 - 111 mmol/L   CO2 24 22 - 32 mmol/L   Glucose, Bld 95 65 - 99 mg/dL   BUN 7 6 - 20 mg/dL   Creatinine, Ser 5.73 0.44 - 1.00 mg/dL   Calcium 9.1 8.9 - 22.0 mg/dL   Total Protein 7.3 6.5 - 8.1 g/dL   Albumin 3.9 3.5 - 5.0 g/dL   AST 22 15 - 41 U/L   ALT 11 (L) 14 - 54 U/L   Alkaline Phosphatase 70 38 - 126 U/L   Total Bilirubin 0.6 0.3 - 1.2 mg/dL   GFR calc non Af Amer >60 >60 mL/min   GFR calc Af Amer >60 >60 mL/min   Anion gap 11 5 - 15  Ethanol  Result Value Ref Range   Alcohol, Ethyl (B) <5 <5 mg/dL  Salicylate level  Result Value Ref Range   Salicylate Lvl <7.0 2.8 - 30.0 mg/dL  Acetaminophen level  Result Value Ref Range   Acetaminophen (Tylenol), Serum <10 (L) 10 - 30 ug/mL  cbc  Result Value Ref Range   WBC 8.7 4.0 - 10.5 K/uL   RBC 4.96 3.87 - 5.11 MIL/uL   Hemoglobin 13.9 12.0 - 15.0 g/dL   HCT 16.1 09.6 - 04.5 %   MCV 85.9 78.0 - 100.0 fL   MCH 28.0 26.0 - 34.0 pg   MCHC 32.6 30.0 - 36.0 g/dL   RDW 40.9 81.1 - 91.4 %   Platelets 249 150 - 400 K/uL  Rapid urine drug screen  (hospital performed)  Result Value Ref Range   Opiates NONE DETECTED NONE DETECTED   Cocaine POSITIVE (A) NONE DETECTED   Benzodiazepines NONE DETECTED NONE DETECTED   Amphetamines NONE DETECTED NONE DETECTED   Tetrahydrocannabinol NONE DETECTED NONE DETECTED   Barbiturates NONE DETECTED NONE DETECTED  I-stat troponin, ED  Result Value Ref Range   Troponin i, poc 0.00 0.00 - 0.08 ng/mL   Comment 3          I-Stat beta hCG blood, ED  Result Value Ref Range   I-stat hCG, quantitative <5.0 <5 mIU/mL   Comment 3          I-stat troponin, ED  Result Value Ref Range   Troponin i, poc 0.00 0.00 - 0.08 ng/mL   Comment 3           Dg Chest 2 View  Result Date: 06/06/2016 CLINICAL DATA:  Chest pain EXAM: CHEST  2 VIEW COMPARISON:  04/04/2016 FINDINGS: Cardiomediastinal silhouette is unremarkable. No infiltrate or pleural effusion. No pulmonary edema. Mild levoscoliosis upper thoracic spine. There is dextroscoliosis lower thoracic and upper lumbar spine. IMPRESSION: No active cardiopulmonary disease. Thoracic scoliosis as described above. Electronically Signed   By: Natasha Mead M.D.   On: 06/06/2016 21:34   Initial Impression / Assessment and Plan / ED Course  I have reviewed the triage vital signs and the nursing notes.  Pertinent labs & imaging results that were available during my care of the patient were reviewed by me and considered in my medical decision making (see chart for details).     Doubt acute coronary syndrome. Atypical story. Heart score equals 2 We'll treat empirically  for sexually transmitted diseases. Rocephin, Zithromax, flagyl. Patient is medically cleared for psychiatric evaluation.  Final Clinical Impressions(s) / ED Diagnoses  Diagnosis #1 suicidal ideation #2 atypical chest pain #3 cervicitis #4 trichomoniasis #4 substance abuse Final diagnoses:  None    New Prescriptions New Prescriptions   No medications on file     Doug Sou, MD 06/07/16  [id number]    Doug Sou, MD 06/07/16 [id number]    Doug Sou, MD 06/07/16 [id number]

## 2016-06-06 NOTE — ED Notes (Signed)
Pt removed herself from cardiac monitor, attempts made to explain to patient importance of CCM. Pt states she needs to use phone then she will get back on monitor

## 2016-06-06 NOTE — ED Notes (Signed)
Pt. Reports uppe central chest pain with some shortness of breath. Pt reports "chest pain has been going on for two weeks but has gotten worse". Pt also reports thoughts of harming herself. Pt reports "planning to cut her wrists with a blade". Pt. Reports hallucinations of seeing airplanes and "feeling paranoid as if everyone is looking and talking about me". Pt reports having a history of bipolar and schizophrenia. Pt. Reports stopping her perscriptions to self medicate with cocaine. Last reported use was last night.

## 2016-06-06 NOTE — ED Notes (Signed)
Per Jillyn HiddenGary in staffing, no sitters available at this time.

## 2016-06-07 LAB — RPR: RPR Ser Ql: NONREACTIVE

## 2016-06-07 LAB — WET PREP, GENITAL
SPERM: NONE SEEN
Yeast Wet Prep HPF POC: NONE SEEN

## 2016-06-07 LAB — HIV ANTIBODY (ROUTINE TESTING W REFLEX): HIV Screen 4th Generation wRfx: NONREACTIVE

## 2016-06-07 LAB — GC/CHLAMYDIA PROBE AMP (~~LOC~~) NOT AT ARMC
CHLAMYDIA, DNA PROBE: NEGATIVE
Neisseria Gonorrhea: NEGATIVE

## 2016-06-07 MED ORDER — HYDROXYZINE HCL 50 MG PO TABS
50.0000 mg | ORAL_TABLET | Freq: Four times a day (QID) | ORAL | Status: DC | PRN
  Administered 2016-06-07: 50 mg via ORAL
  Filled 2016-06-07: qty 1

## 2016-06-07 MED ORDER — CEFTRIAXONE SODIUM 250 MG IJ SOLR
250.0000 mg | Freq: Once | INTRAMUSCULAR | Status: AC
  Administered 2016-06-07: 250 mg via INTRAMUSCULAR
  Filled 2016-06-07: qty 250

## 2016-06-07 MED ORDER — SERTRALINE HCL 50 MG PO TABS
50.0000 mg | ORAL_TABLET | Freq: Every day | ORAL | Status: DC
  Administered 2016-06-07: 50 mg via ORAL
  Filled 2016-06-07: qty 1

## 2016-06-07 MED ORDER — METRONIDAZOLE 500 MG PO TABS
500.0000 mg | ORAL_TABLET | Freq: Two times a day (BID) | ORAL | Status: DC
  Administered 2016-06-07 (×3): 500 mg via ORAL
  Filled 2016-06-07 (×3): qty 1

## 2016-06-07 MED ORDER — AZITHROMYCIN 1 G PO PACK
1.0000 g | PACK | Freq: Once | ORAL | Status: AC
  Administered 2016-06-07: 1 g via ORAL
  Filled 2016-06-07: qty 1

## 2016-06-07 MED ORDER — TRAZODONE HCL 50 MG PO TABS
150.0000 mg | ORAL_TABLET | Freq: Every evening | ORAL | Status: DC | PRN
  Administered 2016-06-07: 22:00:00 150 mg via ORAL
  Filled 2016-06-07: qty 1

## 2016-06-07 MED ORDER — LURASIDONE HCL 40 MG PO TABS
40.0000 mg | ORAL_TABLET | Freq: Every day | ORAL | Status: DC
  Administered 2016-06-07 – 2016-06-08 (×2): 40 mg via ORAL
  Filled 2016-06-07 (×3): qty 1

## 2016-06-07 MED ORDER — POTASSIUM CHLORIDE CRYS ER 20 MEQ PO TBCR
20.0000 meq | EXTENDED_RELEASE_TABLET | Freq: Once | ORAL | Status: AC
  Administered 2016-06-07: 20 meq via ORAL
  Filled 2016-06-07: qty 1

## 2016-06-07 MED ORDER — METRONIDAZOLE 500 MG PO TABS: 2000.0000 mg | ORAL_TABLET | Freq: Once | ORAL | Status: DC

## 2016-06-07 MED ORDER — NICOTINE 21 MG/24HR TD PT24
21.0000 mg | MEDICATED_PATCH | Freq: Every day | TRANSDERMAL | Status: DC
  Administered 2016-06-07: 21 mg via TRANSDERMAL
  Filled 2016-06-07: qty 1

## 2016-06-07 NOTE — ED Notes (Signed)
Gave pt coke 

## 2016-06-07 NOTE — ED Notes (Signed)
Dinner tray ordered.

## 2016-06-07 NOTE — ED Notes (Signed)
Report given to Lamar LaundrySonya, [facility].

## 2016-06-07 NOTE — ED Notes (Signed)
Pt taken to shower.

## 2016-06-07 NOTE — ED Notes (Signed)
TTS in progress 

## 2016-06-07 NOTE — BH Assessment (Signed)
Pt [facility] been [facility] per [facility]imiro Needle[facility]. [facility] has requested the pt receive potassium prior to transport due to potassium levels being low. Pt able to be transported at any time, bed will be 351 and is available now. Accepting is Dr. Golden PopZeeraindar. Call to report [phone]. Report given to pt's nurse Geralynn Ochsaylor R Morris, RN.  Princess BruinsAquicha Duff, MSW, LCSWA  TTS Specialist

## 2016-06-07 NOTE — [facility] Notes (Signed)
Patient has been referred to the following inpatient treatment facilities: [facility], [facility], and [facility].  At capacity: [facility], [facility], [facility], [facility], [facility], [facility], [facility].  Patient has adult medicaid and can't be referred to [facility], [facility], and [facility].  CSW in disposition will continue to follow up and to seek placement for patient.  Amy Le, LCSWA Disposition staff 06/07/2016 7:29 PM

## 2016-06-07 NOTE — [facility] Counselor (Signed)
Per [facility]im[facility] at [facility], their provider requires that pt be IVC'd before being transported to [facility] due to the long trip there. Called and advised nurse Ladona Ridgelaylor and she sts she will advise EDP Dr. Madilyn Hookees.   Beryle FlockMary Selig Wampole, MS, CRC, [facility] Triage Specialist [facility]

## 2016-06-07 NOTE — ED Notes (Signed)
Re-ordered pt's breakfast tray
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
## 2016-06-07 NOTE — ED Notes (Signed)
Pt provided with snack

## 2016-06-07 NOTE — ED Provider Notes (Signed)
  Physical Exam  BP 121/60   Pulse 88   Temp 98.8 F (37.1 C) (Oral)   Resp (!) 22   LMP 05/23/2016   SpO2 99%   Physical Exam  ED Course  Procedures  [facility] TTS called, patient needs inpatient psych admission. Wet prep + clue cells and trichomonas. Previous provider already ordered azithromycin, rocephin. Will order course of flagyl. Medically cleared at this point.    Charlynne Panderavid Hsienta Yao, MD 06/07/16 [phone]

## 2016-06-07 NOTE — ED Notes (Signed)
[facility] called to notify that the accepting provider would like to IVC the pt due to "a long transportation". Dr Madilyn Hookees notified and states she would like to meet and talk with the pt before she fills out any paperwork.

## 2016-06-07 NOTE — ED Notes (Signed)
Pt provided with meal tray.

## 2016-06-07 NOTE — ED Provider Notes (Signed)
Pt in ED with SI, pending transfer to psychiatric facility.  She has ongoing SI in ED with plan to cut wrists, hallucinations.  She has been medically cleared for Psychiatric treatment.  IVC completed for safe patient transfer to Psychiatric facility.     Amy FossaElizabeth Brinlynn Gorton, MD 06/07/16 [phone]

## 2016-06-08 NOTE — ED Provider Notes (Signed)
Pt's EMTALA done again this morning. Accepted to OSH facility for SI, psychoses.  Vitals:   06/08/16 0500 06/08/16 0858  BP: 128/73 123/79  Pulse: 84 98  Resp: 18 17  Temp: 98.3 F (36.8 C) 98.9 F (37.2 C)      Derwood KaplanAnkit Maxx Pham, MD 06/08/16 [phone]

## 2016-06-09 NOTE — Progress Notes (Deleted)
Pt d/c home per MD order, pt tol well, pt verbalized understanding of D/C, pt family at BS, d/c instructions given all questions answered, nursing will cont to monitor 

## 2016-07-17 ENCOUNTER — Emergency Department (HOSPITAL_COMMUNITY)
Admission: EM | Admit: 2016-07-17 | Discharge: 2016-07-20 | Disposition: A | Discharge status: Other Acute Inpatient Hospital | Payer: Medicaid Other | Attending: Emergency Medicine | Admitting: Emergency Medicine

## 2016-07-17 DIAGNOSIS — F1210 Cannabis abuse, uncomplicated: Secondary | ICD-10-CM | POA: Insufficient documentation

## 2016-07-17 DIAGNOSIS — F1910 Other psychoactive substance abuse, uncomplicated: Secondary | ICD-10-CM

## 2016-07-17 DIAGNOSIS — Z79899 Other long term (current) drug therapy: Secondary | ICD-10-CM | POA: Insufficient documentation

## 2016-07-17 DIAGNOSIS — F1410 Cocaine abuse, uncomplicated: Secondary | ICD-10-CM | POA: Insufficient documentation

## 2016-07-17 DIAGNOSIS — R45851 Suicidal ideations: Secondary | ICD-10-CM

## 2016-07-17 DIAGNOSIS — F319 Bipolar disorder, unspecified: Secondary | ICD-10-CM | POA: Insufficient documentation

## 2016-07-17 DIAGNOSIS — F17210 Nicotine dependence, cigarettes, uncomplicated: Secondary | ICD-10-CM | POA: Insufficient documentation

## 2016-07-17 DIAGNOSIS — F14251 Cocaine dependence with cocaine-induced psychotic disorder with hallucinations: Secondary | ICD-10-CM | POA: Diagnosis present

## 2016-07-17 LAB — COMPREHENSIVE METABOLIC PANEL
ALBUMIN: 4.2 g/dL (ref 3.5–5.0)
ALT: 10 U/L — AB (ref 14–54)
AST: 18 U/L (ref 15–41)
Alkaline Phosphatase: 77 U/L (ref 38–126)
Anion gap: 8 (ref 5–15)
BUN: 5 mg/dL — AB (ref 6–20)
CHLORIDE: 107 mmol/L (ref 101–111)
CO2: 22 mmol/L (ref 22–32)
CREATININE: 0.83 mg/dL (ref 0.44–1.00)
Calcium: 9.4 mg/dL (ref 8.9–10.3)
GFR calc Af Amer: >60 mL/min (ref >60)
Glucose, Bld: 98 mg/dL (ref 65–99)
POTASSIUM: 3.3 mmol/L — AB (ref 3.5–5.1)
SODIUM: 137 mmol/L (ref 135–145)
Total Bilirubin: 0.6 mg/dL (ref 0.3–1.2)
Total Protein: 8 g/dL (ref 6.5–8.1)

## 2016-07-17 LAB — SALICYLATE LEVEL: Salicylate Lvl: <7 mg/dL (ref 2.8–30.0)

## 2016-07-17 LAB — CBC
HCT: 41.3 % (ref 36.0–46.0)
Hemoglobin: 13.8 g/dL (ref 12.0–15.0)
MCH: 28.5 pg (ref 26.0–34.0)
MCHC: 33.4 g/dL (ref 30.0–36.0)
MCV: 85.2 fL (ref 78.0–100.0)
PLATELETS: 216 10*3/uL (ref 150–400)
RBC: 4.85 MIL/uL (ref 3.87–5.11)
RDW: 14.7 % (ref 11.5–15.5)
WBC: 6.7 10*3/uL (ref 4.0–10.5)

## 2016-07-17 LAB — RAPID URINE DRUG SCREEN, HOSP PERFORMED
AMPHETAMINES: NOT DETECTED
Barbiturates: NOT DETECTED
Benzodiazepines: NOT DETECTED
Cocaine: POSITIVE — AB
OPIATES: NOT DETECTED
TETRAHYDROCANNABINOL: POSITIVE — AB

## 2016-07-17 LAB — ACETAMINOPHEN LEVEL: Acetaminophen (Tylenol), Serum: <10 ug/mL — ABNORMAL LOW (ref 10–30)

## 2016-07-17 LAB — ETHANOL

## 2016-07-17 MED ORDER — TRAZODONE HCL 50 MG PO TABS
150.0000 mg | ORAL_TABLET | Freq: Every evening | ORAL | Status: DC | PRN
  Administered 2016-07-18: 150 mg via ORAL
  Filled 2016-07-17: qty 1

## 2016-07-17 MED ORDER — NICOTINE 21 MG/24HR TD PT24
21.0000 mg | MEDICATED_PATCH | Freq: Every day | TRANSDERMAL | Status: DC | PRN
  Filled 2016-07-17: qty 1

## 2016-07-17 MED ORDER — HYDROXYZINE HCL 50 MG PO TABS
50.0000 mg | ORAL_TABLET | Freq: Four times a day (QID) | ORAL | Status: DC | PRN
  Administered 2016-07-17 – 2016-07-19 (×4): 50 mg via ORAL
  Filled 2016-07-17 (×3): qty 1
  Filled 2016-07-17: qty 2

## 2016-07-17 MED ORDER — IBUPROFEN 400 MG PO TABS: 600.0000 mg | ORAL_TABLET | Freq: Three times a day (TID) | ORAL | Status: DC | PRN

## 2016-07-17 MED ORDER — ACETAMINOPHEN 325 MG PO TABS
650.0000 mg | ORAL_TABLET | ORAL | Status: DC | PRN
  Administered 2016-07-18: 650 mg via ORAL
  Filled 2016-07-17: qty 2

## 2016-07-17 MED ORDER — ONDANSETRON HCL 4 MG PO TABS
4.0000 mg | ORAL_TABLET | Freq: Three times a day (TID) | ORAL | Status: DC | PRN
Start: 2016-07-17 — End: 2016-07-20

## 2016-07-17 MED ORDER — ZOLPIDEM TARTRATE 5 MG PO TABS
5.0000 mg | ORAL_TABLET | Freq: Every evening | ORAL | Status: DC | PRN
  Administered 2016-07-19: 5 mg via ORAL
  Filled 2016-07-17: qty 1

## 2016-07-17 MED ORDER — SERTRALINE HCL 50 MG PO TABS
50.0000 mg | ORAL_TABLET | Freq: Every day | ORAL | Status: DC
  Administered 2016-07-17 – 2016-07-19 (×3): 50 mg via ORAL
  Filled 2016-07-17 (×3): qty 1

## 2016-07-17 MED ORDER — LURASIDONE HCL 40 MG PO TABS
40.0000 mg | ORAL_TABLET | Freq: Every day | ORAL | Status: DC
  Administered 2016-07-18 – 2016-07-19 (×2): 40 mg via ORAL
  Filled 2016-07-17 (×4): qty 1

## 2016-07-17 MED ORDER — POTASSIUM CHLORIDE CRYS ER 20 MEQ PO TBCR
40.0000 meq | EXTENDED_RELEASE_TABLET | Freq: Once | ORAL | Status: AC
  Administered 2016-07-17: 40 meq via ORAL
  Filled 2016-07-17: qty 2

## 2016-07-17 MED ORDER — LORAZEPAM 1 MG PO TABS
1.0000 mg | ORAL_TABLET | Freq: Once | ORAL | Status: AC
  Administered 2016-07-17: 1 mg via ORAL
  Filled 2016-07-17: qty 1

## 2016-07-17 MED ORDER — ALUM & MAG HYDROXIDE-SIMETH 200-200-20 MG/5ML PO SUSP: 30.0000 mL | ORAL | Status: DC | PRN

## 2016-07-17 NOTE — ED Provider Notes (Signed)
[facility]EMERGENCY DEPT Provider Note   CSN: [id number] Arrival date & time: 07/17/16  1400     History   Chief Complaint Chief Complaint  Patient presents with  . Psychiatric Evaluation    HPI Amy Le is a 41 y.o. female.  Patient called EMS, because she was feeling unstable.  She feels like her psychiatric illness is out of control.  She has not taken any of her psychiatric medications, since being discharged from a psychiatric hospital 1 month ago.  She states that she has reverted to "using drugs".  She also occasionally hears voices, which are not commanding her to do anything.  She states that she feels suicidal but does not have a plan.  She states that she has homicidal but does not have a plan or anybody that she knows that she wants to kill.  She is staying in various places, not in a shelter.  She was supposed to follow-up at [facility] psychiatric outpatient clinic for a appointment, 3 weeks ago but did not go to that appointment.  She was transported here by police, she is not in custody, and has not been committed.  The patient denies headache, fever, nausea, vomiting, cough, shortness of breath, chest pain, weakness or dizziness.  There are no other known modifying factors.    HPI  Past Medical History:  Diagnosis Date  . Bipolar 1 disorder (HCC)   . BV (bacterial vaginosis)   . Chlamydia   . Trichomonas     Patient Active Problem List   Diagnosis Date Noted  . Severe episode of recurrent major depressive disorder, without psychotic features (HCC)   . PTSD (post-traumatic stress disorder) 03/25/2016  . 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 FOOT SURGERY    . MULTIPLE TOOTH EXTRACTIONS Bilateral 2017  . TUBAL LIGATION      OB History    No data available       Home Medications    Prior to Admission medications   Medication Sig Start Date  End Date Taking? Authorizing Provider  hydrOXYzine (ATARAX/VISTARIL) 50 MG tablet Take 1 tablet (50 mg total) by mouth every 6 (six) hours as needed for anxiety. 04/09/16  Yes Adonis Brook, NP  lurasidone (LATUDA) 40 MG TABS tablet Take 1 tablet (40 mg total) by mouth daily with breakfast. 04/10/16  Yes Adonis Brook, NP  sertraline (ZOLOFT) 50 MG tablet Take 1 tablet (50 mg total) by mouth daily. 04/10/16  Yes Adonis Brook, NP  traZODone (DESYREL) 150 MG tablet Take 1 tablet (150 mg total) by mouth at bedtime as needed for sleep. 04/09/16  Yes Adonis Brook, NP    Family History Family History  Problem Relation Age of Onset  . Mental illness Neg Hx     Social History Social History  Substance Use Topics  . Smoking status: Current Every Day Smoker    Packs/day: 0.50    Types: Cigarettes  . Smokeless tobacco: Never Used  . Alcohol use No     Allergies   Patient has no known allergies.   Review of Systems Review of Systems  All other systems reviewed and are negative.    Physical Exam Updated Vital Signs BP 132/84   Pulse 86   Temp 98 F (36.7 C)   Resp 18   LMP 07/13/2016 (Exact Date)   SpO2 100%   Physical Exam  Constitutional: She is oriented to person, place,  and time. She appears well-developed and well-nourished. No distress.  HENT:  Head: Normocephalic and atraumatic.  Eyes: Conjunctivae and EOM are normal. Pupils are equal, round, and reactive to light.  Neck: Normal range of motion and phonation normal. Neck supple.  Cardiovascular: Normal rate and regular rhythm.   Pulmonary/Chest: Effort normal and breath sounds normal. She exhibits no tenderness.  Abdominal: Soft. She exhibits no distension. There is no tenderness. There is no guarding.  Musculoskeletal: Normal range of motion.  Neurological: She is alert and oriented to person, place, and time. She exhibits normal muscle tone.  No dysarthria, aphasia, or nystagmus.  Skin: Skin is warm and dry.    Psychiatric: Her behavior is normal. Judgment and thought content normal.  She is anxious.  She does not appear to be responding to internal stimuli.  She is cooperative, and friendly.  Nursing note and vitals reviewed.    ED Treatments / Results  Labs (all labs ordered are listed, but only abnormal results are displayed) Labs Reviewed  COMPREHENSIVE METABOLIC PANEL - Abnormal; Notable for the following:       Result Value   Potassium 3.3 (*)    BUN 5 (*)    ALT 10 (*)    All other components within normal limits  ACETAMINOPHEN LEVEL - Abnormal; Notable for the following:    Acetaminophen (Tylenol), Serum <10 (*)    All other components within normal limits  RAPID URINE DRUG SCREEN, HOSP PERFORMED - Abnormal; Notable for the following:    Cocaine POSITIVE (*)    Tetrahydrocannabinol POSITIVE (*)    All other components within normal limits  ETHANOL  SALICYLATE LEVEL  CBC    EKG  EKG Interpretation None       Radiology No results found.  Procedures Procedures (including critical care time)  Medications Ordered in ED Medications  potassium chloride SA (K-DUR,KLOR-CON) CR tablet 40 mEq (not administered)  acetaminophen (TYLENOL) tablet 650 mg (not administered)  ibuprofen (ADVIL,MOTRIN) tablet 600 mg (not administered)  zolpidem (AMBIEN) tablet 5 mg (not administered)  nicotine (NICODERM CQ - dosed in mg/24 hours) patch 21 mg (not administered)  ondansetron (ZOFRAN) tablet 4 mg (not administered)  alum & mag hydroxide-simeth (MAALOX/MYLANTA) 200-200-20 MG/5ML suspension 30 mL (not administered)  LORazepam (ATIVAN) tablet 1 mg (1 mg Oral Given 07/17/16 1542)     Initial Impression / Assessment and Plan / ED Course  I have reviewed the triage vital signs and the nursing notes.  Pertinent labs & imaging results that were available during my care of the patient were reviewed by me and considered in my medical decision making (see chart for details).  Clinical  Course as of Jul 17 1636  Sat Jul 17, 2016  1634 Slightly low.  She is not on any potassium leaching medications.  This is likely nutritional.  We will give a single dose of potassium and encourage her to eat a regular diet. Potassium: (!) 3.3 [EW]  1634 Substance of abuse COCAINE: (!) POSITIVE [EW]  1634 Substance of abuse Tetrahydrocannabinol: (!) POSITIVE [EW]  1635 Normal Alcohol, Ethyl (B): <5 [EW]  1635 At this time she is medically cleared for treatment by psychiatry  [EW]    Clinical Course User Index [EW] Mancel BaleWentz, Hamzeh Tall, MD    Medications  potassium chloride SA (K-DUR,KLOR-CON) CR tablet 40 mEq (not administered)  acetaminophen (TYLENOL) tablet 650 mg (not administered)  ibuprofen (ADVIL,MOTRIN) tablet 600 mg (not administered)  zolpidem (AMBIEN) tablet 5 mg (not administered)  nicotine (  NICODERM CQ - dosed in mg/24 hours) patch 21 mg (not administered)  ondansetron (ZOFRAN) tablet 4 mg (not administered)  alum & mag hydroxide-simeth (MAALOX/MYLANTA) 200-200-20 MG/5ML suspension 30 mL (not administered)  LORazepam (ATIVAN) tablet 1 mg (1 mg Oral Given 07/17/16 1542)    Patient Vitals for the past 24 hrs:  BP Temp Pulse Resp SpO2  07/17/16 1410 132/84 98 F (36.7 C) 86 18 100 %   TTS consult   Final Clinical Impressions(s) / ED Diagnoses   Final diagnoses:  Polysubstance abuse  Suicidal ideation  Bipolar affective disorder, remission status unspecified (HCC)    Homeless patient with medication and psychiatry noncompliance, with history of bipolar disorder, and recurrent substance abuse; cocaine and marijuana.  She is medically cleared, and will need to seen by the psychiatry service for consideration of placement.  Patient does not have active plans for suicide or homicide, so she will not be committed at this time.  Nursing Notes Reviewed/ Care Coordinated Applicable Imaging Reviewed Interpretation of Laboratory Data incorporated into ED treatment   Plan-as per  TTS in conjunction with oncoming provider team  New Prescriptions New Prescriptions   No medications on file     Mancel Bale, MD 07/17/16 [phone]

## 2016-07-17 NOTE — BH Assessment (Addendum)
Assessment Note  Amy Le is an 41 y.o. female. Pt reports she came to [facility] for suicidal and homicidal thoughts. PT refused to state what her plan for suicide or homicide is, but states that she is upset with people in her neighborhood who have "messed with" me and that she has access to a gun.  Pt states she is feeling paranoid like those people are out to get her and poisoning her food.  Pt also states she was hospitalized last month and did not follow up after discharge and has also stopped taking her medication.  Pt reports she is having auditory and visual hallucinations.  PT also reports daily use of cocaine and weekly use of alcohol. Diagnosis: major depressive disorder, cocaine use disorder  Past Medical History:  Past Medical History:  Diagnosis Date  . Bipolar 1 disorder (HCC)   . BV (bacterial vaginosis)   . Chlamydia   . Trichomonas     Past Surgical History:  Procedure Laterality Date  . FOOT SURGERY    . MULTIPLE TOOTH EXTRACTIONS Bilateral 2017  . TUBAL LIGATION      Family History:  Family History  Problem Relation Age of Onset  . Mental illness Neg Hx     Social History:  reports that she has been smoking Cigarettes.  She has been smoking about 0.50 packs per day. She has never used smokeless tobacco. She reports that she uses drugs, including "Crack" cocaine, Cocaine, and Marijuana. She reports that she does not drink alcohol.  Additional Social History:  Alcohol / Drug Use Pain Medications: pt denies Prescriptions: pt denies Over the Counter: pt denies History of alcohol / drug use?: Yes Substance #1 Name of Substance 1: crack cocaine 1 - Age of First Use: 22 1 - Amount (size/oz): $100 1 - Frequency: daily 1 - Duration: 3 months 1 - Last Use / Amount: 5/4, $100-150 Substance #2 Name of Substance 2: Alcohol 2 - Age of First Use: 15 2 - Amount (size/oz): 3 drinks 2 - Frequency: 1x week 2 - Last Use / Amount: 5/2  CIWA: CIWA-Ar BP:  132/84 Pulse Rate: 86 COWS:    Allergies: No Known Allergies  Home Medications:  (Not in a hospital admission)  OB/GYN Status:  Patient's last menstrual period was 07/13/2016 (exact date).  General Assessment Data Location of Assessment: [facility] ED TTS Assessment: In system Is this a Tele or Face-to-Face Assessment?: Face-to-Face Is this an Initial Assessment or a Re-assessment for this encounter?: Initial Assessment Marital status: Single Is patient pregnant?: No Pregnancy Status: No Living Arrangements: Non-relatives/Friends (Pt states she moves around a lot) Can pt return to current living arrangement?: Yes Admission Status: Voluntary Is patient capable of signing voluntary admission?: Yes Referral Source: Self/Family/Friend     Crisis Care Plan Living Arrangements: Non-relatives/Friends (Pt states she moves around a lot) Name of Psychiatrist: none Name of Therapist: none  Education Status Is patient currently in school?: No  Risk to self with the past 6 months Suicidal Ideation: Yes-Currently Present Has patient been a risk to self within the past 6 months prior to admission? : Yes Suicidal Intent: Yes-Currently Present Has patient had any suicidal intent within the past 6 months prior to admission? : Yes Is patient at risk for suicide?: Yes Suicidal Plan?: Yes-Currently Present Has patient had any suicidal plan within the past 6 months prior to admission? : Yes Specify Current Suicidal Plan: pt refuses to share what her plan is Access to Means:  (unknown) What  has been your use of drugs/alcohol within the last 12 months?: current use Previous Attempts/Gestures: Yes How many times?: 1 Triggers for Past Attempts: Other (Comment) (family problems) Intentional Self Injurious Behavior: None Family Suicide History: No Recent stressful life event(s): Conflict (Comment), Other (Comment) (pt reports her children dont live with her) Persecutory voices/beliefs?:  Yes Depression: Yes Depression Symptoms: Despondent, Insomnia, Tearfulness, Isolating, Fatigue, Guilt, Loss of interest in usual pleasures, Feeling worthless/self pity, Feeling angry/irritable Substance abuse history and/or treatment for substance abuse?: Yes Suicide prevention information given to non-admitted patients: Not applicable  Risk to Others within the past 6 months Homicidal Ideation: Yes-Currently Present Does patient have any lifetime risk of violence toward others beyond the six months prior to admission? : Yes (comment) Thoughts of Harm to Others: Yes-Currently Present Comment - Thoughts of Harm to Others: Pt reports HI towards people in her neighborhood who have "messed with me" Current Homicidal Intent: Yes-Currently Present Current Homicidal Plan: Yes-Currently Present Describe Current Homicidal Plan: pt refuses to share her plan Access to Homicidal Means: Yes Describe Access to Homicidal Means: pt states she has access to a gun Identified Victim: no specific person: "people in my neighborhood." History of harm to others?: Yes Assessment of Violence:  (Pt states she has hurt people but will not give details.) Violent Behavior Description: unknown Does patient have access to weapons?: Yes (Comment) (states she has access to a gun) Criminal Charges Pending?: No Does patient have a court date: No Is patient on probation?: No  Psychosis Hallucinations: Auditory, Visual, With command (voice tells me to do "bad stuff") Delusions: None noted  Mental Status Report Appearance/Hygiene: Disheveled, In scrubs Eye Contact: Good Motor Activity: Restlessness Speech: Unremarkable Level of Consciousness: Alert Mood: Pleasant Affect: Other (Comment), Anxious Anxiety Level: Moderate Thought Processes: Relevant Judgement: Unimpaired Orientation: Person, Place, Time, Situation Obsessive Compulsive Thoughts/Behaviors: None  Cognitive Functioning Concentration: Unable to  Assess Memory: Recent Intact, Remote Intact IQ: Average Insight: Poor Impulse Control: Poor Appetite: Fair Weight Loss: 20 Weight Gain: 0 Sleep: Decreased Total Hours of Sleep: 6 Vegetative Symptoms: None  ADLScreening [facility] Assessment Services) Patient's cognitive ability adequate to safely complete daily activities?: Yes Patient able to express need for assistance with ADLs?: Yes Independently performs ADLs?: Yes (appropriate for developmental age)  Prior Inpatient Therapy Prior Inpatient Therapy: Yes Prior Therapy Dates: multiple admissions-including recent Prior Therapy Facilty/Provider(s): [facility] Mitzi Davenport([place], [place]) Reason for Treatment: substance abuse; depression  Prior Outpatient Therapy Prior Outpatient Therapy: Yes Prior Therapy Dates: unk Prior Therapy Facilty/Provider(s): [facility] Reason for Treatment: Bipolar 1 Does patient have an ACCT team?: No Does patient have Intensive In-House Services?  : No Does patient have [facility] services? : No Does patient have P4CC services?: No  ADL Screening (condition at time of admission) Patient's cognitive ability adequate to safely complete daily activities?: Yes Patient able to express need for assistance with ADLs?: Yes Independently performs ADLs?: Yes (appropriate for developmental age)       Abuse/Neglect Assessment (Assessment to be complete while patient is alone) Physical Abuse: Denies Verbal Abuse: Denies Sexual Abuse: Denies Exploitation of patient/patient's resources: Denies Self-Neglect: Denies          Additional Information 1:1 In Past 12 Months?:  (unknown) CIRT Risk: No Elopement Risk: No Does patient have medical clearance?: Yes     Disposition: Discussed pt with PA Claudette Headonrad Withrow [facility].  Due to history of inconsistent statements while under the influence of substances, pt will be reevaluated in AM for possible discharge.  Disposition Initial Assessment Completed for this Encounter:  Yes Disposition of Patient: Inpatient treatment program Type of inpatient treatment program: Adult  On Site Evaluation by:   Reviewed with Physician:    Lorri Frederick 07/17/2016 6:00 PM

## 2016-07-17 NOTE — ED Triage Notes (Signed)
Pt presents with SI and HI, auditory and visual hallucinations; pt reports she has a plan, declined to tell what it is but has the means.  Pt reports she has been off her medication "for a minute".

## 2016-07-17 NOTE — ED Notes (Signed)
Dinner tray given to pt.  Pt eating at this time

## 2016-07-17 NOTE — ED Notes (Signed)
Dinner tray ordered.

## 2016-07-18 NOTE — ED Notes (Signed)
Ordered regular breakfast tray for pt.  

## 2016-07-18 NOTE — ED Notes (Signed)
Pt given orange sherbet upon request d/t her being asleep at snack time. Reviewed meal and snack time rules with pt.

## 2016-07-19 DIAGNOSIS — F332 Major depressive disorder, recurrent severe without psychotic features: Secondary | ICD-10-CM | POA: Diagnosis not present

## 2016-07-19 DIAGNOSIS — F1490 Cocaine use, unspecified, uncomplicated: Secondary | ICD-10-CM | POA: Diagnosis not present

## 2016-07-19 DIAGNOSIS — F17210 Nicotine dependence, cigarettes, uncomplicated: Secondary | ICD-10-CM | POA: Diagnosis not present

## 2016-07-19 DIAGNOSIS — R45851 Suicidal ideations: Secondary | ICD-10-CM

## 2016-07-19 DIAGNOSIS — F1290 Cannabis use, unspecified, uncomplicated: Secondary | ICD-10-CM | POA: Diagnosis not present

## 2016-07-19 DIAGNOSIS — F14251 Cocaine dependence with cocaine-induced psychotic disorder with hallucinations: Secondary | ICD-10-CM

## 2016-07-19 DIAGNOSIS — R45850 Homicidal ideations: Secondary | ICD-10-CM

## 2016-07-19 MED ORDER — LORAZEPAM 0.5 MG PO TABS
0.5000 mg | ORAL_TABLET | Freq: Once | ORAL | Status: AC
  Administered 2016-07-19: 0.5 mg via ORAL
  Filled 2016-07-19: qty 1

## 2016-07-19 NOTE — ED Notes (Signed)
Pt still c/o anxiety, EDP made aware. Advise a one time dose of .5mg  ativan.
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
## 2016-07-19 NOTE — ED Notes (Addendum)
Dinner tray ordered -- snack given

## 2016-07-19 NOTE — ED Notes (Signed)
Pt given dinner tray, eating currently.

## 2016-07-19 NOTE — ED Notes (Signed)
Pt requesting something for anxiety.

## 2016-07-20 ENCOUNTER — Inpatient Hospital Stay (HOSPITAL_COMMUNITY)
Admission: AD | Admit: 2016-07-20 | Discharge: 2016-07-26 | DRG: 885 | Disposition: A | Discharge status: Home / Self Care | Payer: Medicaid Other | Source: Intra-hospital | Attending: Psychiatry | Admitting: Psychiatry

## 2016-07-20 ENCOUNTER — Encounter (HOSPITAL_COMMUNITY): Payer: Self-pay

## 2016-07-20 DIAGNOSIS — G4700 Insomnia, unspecified: Secondary | ICD-10-CM | POA: Diagnosis present

## 2016-07-20 DIAGNOSIS — Z79899 Other long term (current) drug therapy: Secondary | ICD-10-CM

## 2016-07-20 DIAGNOSIS — F419 Anxiety disorder, unspecified: Secondary | ICD-10-CM | POA: Diagnosis present

## 2016-07-20 DIAGNOSIS — R45851 Suicidal ideations: Secondary | ICD-10-CM | POA: Diagnosis present

## 2016-07-20 DIAGNOSIS — Z8619 Personal history of other infectious and parasitic diseases: Secondary | ICD-10-CM | POA: Diagnosis not present

## 2016-07-20 DIAGNOSIS — F32A Depression, unspecified: Secondary | ICD-10-CM | POA: Diagnosis present

## 2016-07-20 DIAGNOSIS — F17210 Nicotine dependence, cigarettes, uncomplicated: Secondary | ICD-10-CM | POA: Diagnosis not present

## 2016-07-20 DIAGNOSIS — F1420 Cocaine dependence, uncomplicated: Secondary | ICD-10-CM | POA: Diagnosis present

## 2016-07-20 DIAGNOSIS — F4310 Post-traumatic stress disorder, unspecified: Secondary | ICD-10-CM | POA: Diagnosis not present

## 2016-07-20 DIAGNOSIS — F1220 Cannabis dependence, uncomplicated: Secondary | ICD-10-CM | POA: Diagnosis not present

## 2016-07-20 DIAGNOSIS — F329 Major depressive disorder, single episode, unspecified: Secondary | ICD-10-CM | POA: Diagnosis present

## 2016-07-20 DIAGNOSIS — F333 Major depressive disorder, recurrent, severe with psychotic symptoms: Principal | ICD-10-CM | POA: Diagnosis present

## 2016-07-20 MED ORDER — NICOTINE 21 MG/24HR TD PT24
21.0000 mg | MEDICATED_PATCH | Freq: Every day | TRANSDERMAL | Status: DC
  Filled 2016-07-20 (×7): qty 1

## 2016-07-20 MED ORDER — OLANZAPINE 5 MG PO TABS: 5.0000 mg | ORAL_TABLET | Freq: Three times a day (TID) | ORAL | Status: DC | PRN

## 2016-07-20 MED ORDER — ACETAMINOPHEN 325 MG PO TABS
650.0000 mg | ORAL_TABLET | ORAL | Status: DC | PRN
  Administered 2016-07-20 – 2016-07-21 (×2): 650 mg via ORAL
  Filled 2016-07-20 (×2): qty 2

## 2016-07-20 MED ORDER — LURASIDONE HCL 40 MG PO TABS
40.0000 mg | ORAL_TABLET | Freq: Every day | ORAL | Status: DC
  Administered 2016-07-20 – 2016-07-21 (×2): 40 mg via ORAL
  Filled 2016-07-20 (×5): qty 1

## 2016-07-20 MED ORDER — ALUM & MAG HYDROXIDE-SIMETH 200-200-20 MG/5ML PO SUSP: 30.0000 mL | ORAL | Status: DC | PRN

## 2016-07-20 MED ORDER — NICOTINE 21 MG/24HR TD PT24: 21.0000 mg | MEDICATED_PATCH | Freq: Every day | TRANSDERMAL | Status: DC | PRN

## 2016-07-20 MED ORDER — TRAZODONE HCL 150 MG PO TABS
150.0000 mg | ORAL_TABLET | Freq: Every evening | ORAL | Status: DC | PRN
  Administered 2016-07-20 – 2016-07-25 (×6): 150 mg via ORAL
  Filled 2016-07-20 (×6): qty 1

## 2016-07-20 MED ORDER — ONDANSETRON HCL 4 MG PO TABS: 4.0000 mg | ORAL_TABLET | Freq: Three times a day (TID) | ORAL | Status: DC | PRN

## 2016-07-20 MED ORDER — HYDROXYZINE HCL 50 MG PO TABS
50.0000 mg | ORAL_TABLET | Freq: Four times a day (QID) | ORAL | Status: DC | PRN
  Administered 2016-07-20 – 2016-07-26 (×13): 50 mg via ORAL
  Filled 2016-07-20 (×5): qty 1
  Filled 2016-07-20: qty 2
  Filled 2016-07-20 (×7): qty 1

## 2016-07-20 MED ORDER — OLANZAPINE 10 MG IM SOLR: 5.0000 mg | Freq: Three times a day (TID) | INTRAMUSCULAR | Status: DC | PRN

## 2016-07-20 MED ORDER — SERTRALINE HCL 50 MG PO TABS
50.0000 mg | ORAL_TABLET | Freq: Every day | ORAL | Status: DC
  Administered 2016-07-20 – 2016-07-21 (×2): 50 mg via ORAL
  Filled 2016-07-20 (×5): qty 1

## 2016-07-20 MED ORDER — MAGNESIUM HYDROXIDE 400 MG/5ML PO SUSP: 30.0000 mL | Freq: Every day | ORAL | Status: DC | PRN

## 2016-07-20 MED ORDER — IBUPROFEN 600 MG PO TABS
600.0000 mg | ORAL_TABLET | Freq: Three times a day (TID) | ORAL | Status: DC | PRN
  Administered 2016-07-20 – 2016-07-26 (×5): 600 mg via ORAL
  Filled 2016-07-20 (×5): qty 1

## 2016-07-20 NOTE — ED Notes (Signed)
Pt has been advised that Charge,RN is aware of missing sneakers; Pt to follow up with results of search

## 2016-07-20 NOTE — [facility] Suicide Risk Assessment (Signed)
[facility] INPATIENT:  Family/Significant Other Suicide Prevention Education  Suicide Prevention Education:  Patient Refusal for Family/Significant Other Suicide Prevention Education: The patient Amy Le has refused to provide written consent for family/significant other to be provided Family/Significant Other Suicide Prevention Education during admission and/or prior to discharge.  Physician notified.  Ida RogueRodney B Lanise Mergen 07/20/2016, 2:47 PM

## 2016-07-20 NOTE — Plan of Care (Signed)
Problem: Education: Goal: Ability to make informed decisions regarding treatment will improve Outcome: Progressing Nurse discussed depression/suicidal thoughts/coping skills with patient.        

## 2016-07-20 NOTE — Progress Notes (Signed)
EKG completed and given to MD for review.  

## 2016-07-20 NOTE — ED Notes (Signed)
[facility]; Admitting Md Janann ColonelSpencer Simone; Awaiting completion of EMTALA

## 2016-07-20 NOTE — ED Notes (Signed)
ED Provider at bedside. 

## 2016-07-20 NOTE — [facility] Group Notes (Signed)
[facility] LCSW Group Therapy  07/20/2016 , 12:47 PM   Type of Therapy:  Group Therapy  Participation Level:  Active  Participation Quality:  Attentive  Affect:  Appropriate  Cognitive:  Alert  Insight:  Improving  Engagement in Therapy:  Engaged  Modes of Intervention:  Discussion, Exploration and Socialization  Summary of Progress/Problems: Today's group focused on the term Diagnosis.  Participants were asked to define the term, and then pronounce whether it is a negative, positive or neutral term. Attended initially.  Called out to see provider and did not return.   Daryel Geraldorth, Cono Gebhard B 07/20/2016 , 12:47 PM

## 2016-07-20 NOTE — Progress Notes (Signed)
Recreation Therapy Notes  INPATIENT RECREATION THERAPY ASSESSMENT  Patient Details Name: Gus RankinJacquetta Boudoin MRN: [id number] DOB: [birth year] Today's Date: 07/20/2016  Patient Stressors: Family, Other (Comment) (Drug use)  Pt stated she was for suicide.  Coping Skills:   Isolate, Substance Abuse, Avoidance, Self-Injury, Talking, Music  Personal Challenges: Communication, Concentration, Decision-Making, Expressing Yourself, Problem-Solving, Relationships, Self-Esteem/Confidence, Social Interaction, Stress Management, Substance Abuse, Time Management, Trusting Others  Leisure Interests (2+):  Individual - Reading, Individual - Other (Comment) (Sleep)  Awareness of Community Resources:  No  Patient Strengths:  Good listener; respectful  Patient Identified Areas of Improvement:  Drug use; Coping skills  Current Recreation Participation:  Never  Patient Goal for Hospitalization:  "Get better all around"  [place]idence[place]  [place]  Current [place] (including self-harm):  No  Current HI:  No  Consent to Intern Participation: N/A  Caroll RancherMarjette Janal Haak, LRT/CTRS  Caroll RancherLindsay, Woodson Macha A 07/20/2016, 3:35 PM
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
## 2016-07-20 NOTE — Progress Notes (Signed)
Patient ID: [name], female   DOB: 10-Jul-1975, 41 y.o.   MRN: [id number] D: Client reports of her day "pretty comfortable"  Client spends most of the shift in her room. Goal: "regulate medicine" "get balance back in my life, find placement. Client reports cocaine use. A: Writer provided emotional support encouraged client to move forward with recovery. Medications reviewed, administered as ordered. Staff will monitor q1715min for safety. R: client is safe on the unit, did not attend group.

## 2016-07-20 NOTE — [facility] Group Notes (Signed)

## 2016-07-20 NOTE — Tx Team (Signed)
Initial Treatment Plan 07/20/2016 6:46 AM Amy Le ZOX:[id number]    PATIENT STRESSORS: Financial difficulties Occupational concerns Substance abuse   PATIENT STRENGTHS: Capable of independent living Communication skills   PATIENT IDENTIFIED PROBLEMS: SI  "I wanted to cut my wrists but dont know why"  Anxiety  "I think I need some medications to help with my anxiety and depression"               DISCHARGE CRITERIA:  Ability to meet basic life and health needs Improved stabilization in mood, thinking, and/or behavior Medical problems require only outpatient monitoring  PRELIMINARY DISCHARGE PLAN: Attend aftercare/continuing care group Outpatient therapy  PATIENT/FAMILY INVOLVEMENT: This treatment plan has been presented to and reviewed with the patient, Amy Le,.  The patient has been given the opportunity to ask questions and make suggestions.  Sylvan CheeseSteven M Delynn Olvera, RN 07/20/2016, 6:46 AM

## 2016-07-20 NOTE — [facility] Suicide Risk Assessment (Signed)
[facility]   Nursing information obtained from:    Demographic factors:    Current Mental Status:    Loss Factors:    Historical Factors:    Risk Reduction Factors:     Total Time spent with patient: 30 minutes Principal Problem: MDD (major depressive disorder), recurrent, severe, with psychosis (HCC) Diagnosis:   Patient Active Problem List   Diagnosis Date Noted  . Cocaine use disorder, severe, dependence (HCC) [F14.20] 07/20/2016  . MDD (major depressive disorder), recurrent, severe, with psychosis (HCC) [F33.3] 07/20/2016  . Cannabis use disorder, moderate, dependence (HCC) [F12.20] 07/20/2016  . Severe episode of recurrent major depressive disorder, without psychotic features (HCC) [F33.2]   . PTSD (post-traumatic stress disorder) [F43.10] 03/25/2016  . 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 Pt also has been abusing cocaine - 80$ worth per day since the past several years , cannabis often , is motivated to get help.   Continued Clinical Symptoms:    The "Alcohol Use Disorders Identification Test", Guidelines for Use in Primary Care, Second Edition.  World Science writerHealth Organization [facility](WHO). Score between 0-7:  no or low risk or alcohol related problems. Score between 8-15:  moderate risk of alcohol related problems. Score between 16-19:  high risk of alcohol related problems. Score 20 or above:  warrants further diagnostic evaluation for alcohol dependence and treatment.   CLINICAL FACTORS:   Alcohol/Substance Abuse/Dependencies Unstable or Poor Therapeutic Relationship Previous Psychiatric Diagnoses and Treatments   Musculoskeletal: Strength & Muscle Tone: within normal  limits Gait & Station: normal Patient leans: N/A  Psychiatric Specialty Exam: Physical Exam  Review of Systems  Psychiatric/Behavioral: Positive for depression, substance abuse and suicidal ideas. The patient is nervous/anxious.   All other systems reviewed and are negative.   Blood pressure (!) 106/48, pulse 71, temperature 98.2 F (36.8 C), temperature source Oral, resp. rate 18, height 5\' 6"  (1.676 m), weight 98 kg (216 lb), last menstrual period 07/13/2016, SpO2 100 %.Body mass index is 34.86 kg/m.  General Appearance: Casual  Eye Contact:  Fair  Speech:  Clear and Coherent  Volume:  Decreased  Mood:  Anxious, Depressed and Dysphoric  Affect:  Congruent  Thought Process:  Goal Directed and Descriptions of Associations: Circumstantial  Orientation:  Full (Time, Place, and Person)  Thought Content:  Hallucinations: Auditory Command:  kill self, Paranoid Ideation and Rumination  Suicidal Thoughts:  Yes.  without intent/plan  Homicidal Thoughts:  No  Memory:  Immediate;   Fair Recent;   Fair Remote;   Fair  Judgement:  Impaired  Insight:  Shallow  Psychomotor Activity:  Normal  Concentration:  Concentration: Fair and Attention Span: Fair  Recall:  [employer]Fair  Fund of Knowledge:  Fair  Language:  Fair  Akathisia:  No  Handed:  Right  AIMS (if indicated):     Assets:  Communication Skills Desire for Improvement  ADL's:  Intact  Cognition:  WNL  Sleep:         COGNITIVE FEATURES THAT CONTRIBUTE TO RISK:  Closed-mindedness, Polarized thinking and Thought constriction (tunnel vision)    SUICIDE RISK:   Moderate:  Frequent suicidal ideation with limited intensity, and duration, some specificity in terms of plans, no associated intent, good self-control, limited dysphoria/symptomatology,  some risk factors present, and identifiable protective factors, including available and accessible social support.  PLAN OF CARE: Patient to be restarted on Latuda as well as zoloft . Pt  reports she was not compliant and wants to be back on the same dose.  Please also see H&P for plan.  Pt is also motivated to go to a substance abuse treatment program.  I certify that inpatient services furnished can reasonably be expected to improve the patient's condition.   Salle Brandle, MD 07/20/2016, 11:46 AM

## 2016-07-20 NOTE — ED Notes (Signed)
Pt states she had a pair of [employer] sneaker with her on arrival; RN checked belongings inventory sheet and belongings bag with no record of sneakers; Maximiano Cossavid Charge, RN notified and did department search for sneakers; [facility]; Complaint noted on inventory sheet by the transferring RN

## 2016-07-20 NOTE — Progress Notes (Addendum)
D:  Patient stated she does have SI thoughts to kill self, that she feels "hopeless".  Has never actually hurt herself.  Plans to discharge to friend's house.  Does not work.  Does not have any disability income.  Does not take her medications.  No support systems.  No health issues.  Did not go to her therapist appointments.  Has 5 children ages 5226 yrs to 11 yrs.  Children live with family members.  St[facility], then stated police brought her to hospital after she called police.  Stated she uses alittle THC, once week, one joint, since age 41 yrs old.  Denied any alcohol use.  Cocaine, $80.00 daily, since age 89 yrs old.  Denied all other drug use.  Patient rated depression and hopeless #7, anxiety #8. A:  Medications administered per MD orders, but did refuse nicotine patch this morning.  Emotional support and encouragement given patient. R:  Denied HI.  Denied A/V hallucinations.  SI, contracts for safety, no plan.  Safety maintained with 15 minute checks. Locker 1 has one blue, yellow, orange, pink pants, one black, blue, yellow, pink pants, one blue torn shirt (scarf), one pink lighter. Patient has been pleasant.   Fall risk information given and discussed with patient, low fall risk.

## 2016-07-20 NOTE — Progress Notes (Signed)
Recreation Therapy Notes  Date: 07/20/16 Time: 1000 Location: [address] Dayroom  Group Topic: Self-Esteem  Goal Area(s) Addresses:  Patient will identify positive ways to increase self-esteem. Patient will verbalize benefit of increased self-esteem.  Intervention: Construction paper, markers  Activity: Personalized Plates.  Patients were to design personalized license plates that highlights their uniqueness.  Patients could show important dates, important events, things that are special to them or hidden talents they may have.  Education: Self-Esteem, Discharge Planning.   Education Outcome: Acknowledges education/In group clarification offered/Needs additional education  Clinical Observations/Feedback: Pt did not attend group.   Ashanty Coltrane, LRT/CTRS         Ibeth Fahmy A 07/20/2016 12:05 PM 

## 2016-07-20 NOTE — Progress Notes (Signed)
Pt to be transferred to Adult unit room 303-1 this am.  Pt vitals stable.  Pt denies pain or discomfort.  Pt denies SI, HI or AVH.

## 2016-07-20 NOTE — Progress Notes (Signed)
Admission note:  Amy RankinJacquetta Luz [facility]  Pt sts she has been off her meds and is having SI with plan to cut wrists.  Pt sts she has superficially cut wrists in past with no visible scar on R wrist.  Pt sts she has tried to hang self as well. Pt sts she is hearing voices prior to admit telling her to hurt herself.  Pt verbally contracts for safety and sts she currently does not have SI thoughts and does not hear voices. [facility].  Pt admits crack use with $60-$100 per day.  Pt is smoker with no interest in quitting.  Pt failed to go to f/u appointment and cannot explain why.  Pt is wanting medication intervention to help with depression and SI thoughts.  Pt sts problem is "not the cocaine".  Pt hungry. Pt received snacks and drinks.  Pt oriented to unit.  Pt continuously observed for safety except when in bathroom. Pt remains safe on unit.

## 2016-07-20 NOTE — Progress Notes (Signed)
Recreation Therapy Notes  Animal-Assisted Activity (AAA) Program Checklist/Progress Notes Patient Eligibility Criteria Checklist & Daily Group note for Rec TxIntervention  Date: 05.08.2018 Time: 2:45pm [address]: [address]   AAA/T Program Assumption of Risk Form signed by Patient/ or Parent Legal Guardian Yes  Patient is free of allergies or sever asthma Yes  Patient reports no fear of animals Yes  Patient reports no history of cruelty to animals Yes  Patient understands his/her participation is voluntary Yes  Patient washes hands before animal contact Yes  Patient washes hands after animal contact Yes  Behavioral Response: Did not attend.   Marykay Lexenise L Muriel Wilber, LRT/CTRS        Marcela Alatorre L 07/20/2016 3:02 PM

## 2016-07-20 NOTE — H&P (Signed)
Psychiatric Admission Assessment Adult  Patient Identification: Amy Le  MRN:  [id number]  Date of Evaluation:  07/20/2016  Chief Complaint: Suicidal ideations, feeling unstable  Principal Diagnosis: Bipolar 1 disorder, Cocaine use disorder, dependence.  Diagnosis:   Patient Active Problem List   Diagnosis Date Noted  . Cocaine use disorder, severe, dependence (HCC) [F14.20] 07/20/2016  . Depression [F32.9] 07/20/2016  . Severe episode of recurrent major depressive disorder, without psychotic features (HCC) [F33.2]   . PTSD (post-traumatic stress disorder) [F43.10] 03/25/2016  . 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 She was discharged from this hospital on 04-09-16 with prescriptions for her medications & a follow-up appointment to [facility] for further mental health care, Amy Le reports that she did not follow-up care as recommended & has been off of her mental health medications x 2 months. She says she resumed drug use shortly after discharge. During this assessment, Amy Le reports, "I was feeling suicidal. It started a couple of days ago. I brought it upon myself. It is my fault. I was off of my medications x over a month. Since I stopped taking my medicines, I have been here & there, going crazy mentally. I started feeling hopeless, then became suicidal. I would like to get back on my mental health medicines & a referral to a substance abuse treatment center after discharge".  Associated Signs/Symptoms: Depression Symptoms:  depressed mood, insomnia, hopelessness, anxiety,  (Hypo) Manic Symptoms:  Impulsivity, Labiality of Mood,  Anxiety Symptoms:  Excessive Worry,  Psychotic Symptoms:  Admits auditory hallucinations, telling her to kill  herself, denies delusional thoughts or paranoia, ruminations present.Marland Kitchen  PTSD Symptoms: Denies any PTSD symptoms or events.  Total Time spent with patient: 1 hour  Past Psychiatric History: Bipolar disorder, Cocaine dependence.  Is the patient at risk to self? Yes.    Has the patient been a risk to self in the past 6 months? Yes.    Has the patient been a risk to self within the distant past? Yes.    Is the patient a risk to others? No.  Has the patient been a risk to others in the past 6 months? No.  Has the patient been a risk to others within the distant past? No.   Prior Inpatient Therapy: Yes ([facility] x 4). Prior Outpatient Therapy: Yes [facility]).  Alcohol Screening:    Substance Abuse History in the last 12 months:  Yes.    Consequences of Substance Abuse: Medical Consequences:  Liver damage, Possible death by overdose Legal Consequences:  Arrests, jail time, Loss of driving privilege. Family Consequences:  Family discord, divorce and or separation.  Previous Psychotropic Medications: Yes   Psychological Evaluations: No.  Past Medical History:  Past Medical History:  Diagnosis Date  . Bipolar 1 disorder (HCC)   . BV (bacterial vaginosis)   . Chlamydia   . Trichomonas     Past Surgical History:  Procedure Laterality Date  . FOOT SURGERY    . MULTIPLE TOOTH EXTRACTIONS Bilateral 2017  . TUBAL LIGATION     Family History:  Family History  Problem Relation Age of Onset  . Mental illness Neg Hx    Family Psychiatric  History: Denies any family hx of  Mental illness.  Tobacco Screening:   Social History:  History  Alcohol Use  No     History  Drug Use  . Types: "Crack" cocaine, Cocaine, Marijuana    Comment: 60 dollars per day    Additional Social History:  Allergies:  No Known Allergies  Lab Results:  No results found for this or any previous visit (from the past 48 hour(s)). Blood Alcohol level:  Lab Results  Component Value Date   [facility] 07[facility]    ETH <5 06/06/2016   Metabolic Disorder Labs:  Lab Results  Component Value Date   HGBA1C 5.6 03/26/2016   MPG 114 03/26/2016   Lab Results  Component Value Date   PROLACTIN 5.8 03/26/2016   Lab Results  Component Value Date   CHOL 151 03/26/2016   TRIG 149 03/26/2016   HDL 40 (L) 03/26/2016   CHOLHDL 3.8 03/26/2016   VLDL 30 03/26/2016   LDLCALC 81 03/26/2016   Current Medications: Current Facility-Administered Medications  Medication Dose Route Frequency Provider Last Rate Last Dose  . acetaminophen (TYLENOL) tablet 650 mg  650 mg Oral Q4H PRN Thermon Leylandavis, Laura A, NP      . alum & mag hydroxide-simeth (MAALOX/MYLANTA) 200-200-20 MG/5ML suspension 30 mL  30 mL Oral PRN Fransisca Kaufmannavis, Laura A, NP      . hydrOXYzine (ATARAX/VISTARIL) tablet 50 mg  50 mg Oral Q6H PRN Fransisca Kaufmannavis, Laura A, NP      . ibuprofen (ADVIL,MOTRIN) tablet 600 mg  600 mg Oral Q8H PRN Fransisca Kaufmannavis, Laura A, NP      . lurasidone (LATUDA) tablet 40 mg  40 mg Oral Q breakfast Fransisca Kaufmannavis, Laura A, NP   40 mg at 07/20/16 0827  . magnesium hydroxide (MILK OF MAGNESIA) suspension 30 mL  30 mL Oral Daily PRN Eappen, Saramma, MD      . nicotine (NICODERM CQ - dosed in mg/24 hours) patch 21 mg  21 mg Transdermal Daily PRN Fransisca Kaufmannavis, Laura A, NP      . nicotine (NICODERM CQ - dosed in mg/24 hours) patch 21 mg  21 mg Transdermal Daily Cobos, Fernando A, MD      . ondansetron (ZOFRAN) tablet 4 mg  4 mg Oral Q8H PRN Fransisca Kaufmannavis, Laura A, NP      . sertraline (ZOLOFT) tablet 50 mg  50 mg Oral Daily Fransisca Kaufmannavis, Laura A, NP   50 mg at 07/20/16 0827  . traZODone (DESYREL) tablet 150 mg  150 mg Oral QHS PRN Thermon Leylandavis, Laura A, NP       PTA Medications: Prescriptions Prior to Admission  Medication Sig Dispense Refill Last Dose  . hydrOXYzine (ATARAX/VISTARIL) 50 MG tablet Take 1 tablet (50 mg total) by mouth every 6 (six) hours as needed for anxiety. 30 tablet 0 Past Month at Unknown time  . lurasidone (LATUDA) 40 MG TABS tablet Take 1 tablet (40 mg total) by mouth daily  with breakfast. 30 tablet 0 Past Month at Unknown time  . sertraline (ZOLOFT) 50 MG tablet Take 1 tablet (50 mg total) by mouth daily. 30 tablet 0 Past Month at Unknown time  . traZODone (DESYREL) 150 MG tablet Take 1 tablet (150 mg total) by mouth at bedtime as needed for sleep. 30 tablet 0 Past Month at Unknown time   Musculoskeletal: Strength & Muscle Tone: within normal limits Gait & Station: normal Patient leans: N/A  Psychiatric Specialty Exam: Physical Exam  Nursing note and vitals reviewed. Constitutional: She is oriented to person, place, and time. She appears well-developed.  HENT:  Head: Normocephalic.  Eyes: Pupils are equal, round, and reactive  to light.  Neck: Normal range of motion.  Cardiovascular: Normal rate.   Respiratory: Effort normal.  GI: Soft.  Genitourinary:  Genitourinary Comments: Denies any issues in this area.  Musculoskeletal: Normal range of motion.  Neurological: She is alert and oriented to person, place, and time.  Skin: Skin is warm and dry.    Review of Systems  Constitutional: Negative.   HENT: Negative.   Eyes: Negative.   Respiratory: Negative.   Cardiovascular: Negative.   Gastrointestinal: Negative.   Genitourinary: Negative.   Musculoskeletal: Negative.   Skin: Negative.   Neurological: Negative.   Endo/Heme/Allergies: Negative.   Psychiatric/Behavioral: Positive for depression (Hx of SI & attempt by hanging), substance abuse (Hx. Cocaine dependence) and suicidal ideas. Negative for hallucinations and memory loss. The patient is nervous/anxious and has insomnia.     Blood pressure (!) 106/48, pulse 71, temperature 98.2 F (36.8 C), temperature source Oral, resp. rate 18, height 5\' 6"  (1.676 m), weight 98 kg (216 lb), last menstrual period 07/13/2016, SpO2 100 %.Body mass index is 34.86 kg/m.  General Appearance: Casual  Eye Contact:  Fair  Speech:  Clear and Coherent  Volume:  Decreased  Mood:  Anxious, Depressed and Dysphoric   Affect:  Congruent  Thought Process:  Goal Directed and Descriptions of Associations: Circumstantial  Orientation:  Full (Time, Place, and Person)  Thought Content:  Hallucinations: Auditory Command:  kill self, Paranoid Ideation and Rumination  Suicidal Thoughts:  Yes.  without intent/plan  Homicidal Thoughts:  No  Memory:  Immediate;   Fair Recent;   Fair Remote;   Fair  Judgement:  Impaired  Insight:  Shallow  Psychomotor Activity:  Normal  Concentration:  Concentration: Fair and Attention Span: Fair  Recall:  [employer] of Knowledge:  Fair  Language:  Fair  Akathisia:  No  Handed:  Right  AIMS (if indicated):     Assets:  Communication Skills Desire for Improvement  ADL's:  Intact  Cognition:  WNL  Sleep:        Treatment Plan/Recommendations: 1. Admit for crisis management and stabilization, estimated length of stay 3-5 days.  2. Medication management to reduce current symptoms to base line and improve the patient's overall level of functioning: See MAR.  3. Treat health problems as indicated.  4. Develop treatment plan to decrease risk of relapse upon discharge and the need for readmission.  5. Psycho-social education regarding relapse prevention and self care.  6. Health care follow up as needed for medical problems.  7. Review, reconcile, and reinstate any pertinent home medications for other health issues where appropriate. 8. Call for consults with hospitalist for any additional specialty patient care services as needed.  Observation Level/Precautions:  15 minute checks  Laboratory:  Per ED, UDS positive for Cocaine.  Psychotherapy: Group roup sessions  Medications: See MAR  Consultations: As needed    Discharge Concerns: Safety, maintaining sobriety, mood stability  Estimated LOS:  3-5 days  Other: Admit to the [address].     Physician Treatment Plan for Primary Diagnosis: Will initiate medication regimen to stabilize current mood symptoms.  Long Term  Goal(s): Improvement in symptoms so as ready for discharge  Short Term Goals: Ability to identify changes in lifestyle to reduce recurrence of condition will improve, Ability to verbalize feelings will improve, Ability to disclose and discuss suicidal ideas and Ability to demonstrate self-control will improve  Physician Treatment Plan for Secondary Diagnosis: Active Problems:   Cocaine use disorder, severe, dependence (HCC)  Depression  Long Term Goal(s): Improvement in symptoms so as ready for discharge  Short Term Goals: Ability to identify and develop effective coping behaviors will improve, Compliance with prescribed medications will improve and Ability to identify triggers associated with substance abuse/mental health issues will improve  I certify that inpatient services furnished can reasonably be expected to improve the patient's condition.    Sanjuana Kava, NP PMHNP, FNP-BC 5/8/201811:13 AM

## 2016-07-20 NOTE — Social Work (Signed)
Referred to [facility], is [place] resident.  Anne Cunningham, LCSW Lead Clinical Social Worker Phone:  [phone]  

## 2016-07-21 MED ORDER — LURASIDONE HCL 60 MG PO TABS
60.0000 mg | ORAL_TABLET | Freq: Every day | ORAL | Status: DC
  Filled 2016-07-21: qty 1

## 2016-07-21 MED ORDER — LURASIDONE HCL 40 MG PO TABS
40.0000 mg | ORAL_TABLET | Freq: Every day | ORAL | Status: DC
  Administered 2016-07-21 – 2016-07-25 (×5): 40 mg via ORAL
  Filled 2016-07-21 (×7): qty 1

## 2016-07-21 MED ORDER — LURASIDONE HCL 40 MG PO TABS
40.0000 mg | ORAL_TABLET | Freq: Every day | ORAL | Status: DC
  Administered 2016-07-22 – 2016-07-26 (×5): 40 mg via ORAL
  Filled 2016-07-21 (×7): qty 1

## 2016-07-21 MED ORDER — ASENAPINE MALEATE 5 MG SL SUBL
5.0000 mg | SUBLINGUAL_TABLET | Freq: Two times a day (BID) | SUBLINGUAL | Status: DC | PRN
  Administered 2016-07-21: 5 mg via SUBLINGUAL
  Filled 2016-07-21: qty 1

## 2016-07-21 MED ORDER — SERTRALINE HCL 25 MG PO TABS
75.0000 mg | ORAL_TABLET | Freq: Every day | ORAL | Status: DC
  Administered 2016-07-22 – 2016-07-26 (×5): 75 mg via ORAL
  Filled 2016-07-21 (×7): qty 3

## 2016-07-21 NOTE — Progress Notes (Signed)
[facility] MD Progress Note  07/21/2016 2:46 PM Amy Le  MRN:  [id number] Subjective:  Patient states " I have a lot of nerve issues. "  Objective:Patient seen and chart reviewed.Discussed patient with treatment team.  Pt observed as having anxiety issues , reports her as needed medications are not helping. She is afraid to take zyprexa, since it makes her eat a lot . She wonders if there is anything else that she can take. Discussed adding Saphris PRN as well as increasing her zoloft. Per RN , continues to be anxious on and off , does present as paranoid at times. Has been compliant on medications , denies ADRs.    Principal Problem: MDD (major depressive disorder), recurrent, severe, with psychosis (HCC) Diagnosis:   Patient Active Problem List   Diagnosis Date Noted  . Cocaine use disorder, severe, dependence (HCC) [F14.20] 07/20/2016  . MDD (major depressive disorder), recurrent, severe, with psychosis (HCC) [F33.3] 07/20/2016  . Cannabis use disorder, moderate, dependence (HCC) [F12.20] 07/20/2016  . Severe episode of recurrent major depressive disorder, without psychotic features (HCC) [F33.2]   . PTSD (post-traumatic stress disorder) [F43.10] 03/25/2016  . 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   Past Medical History:  Past Medical History:  Diagnosis Date  . Bipolar 1 disorder (HCC)   . BV (bacterial vaginosis)   . Chlamydia   . Trichomonas     Past Surgical History:  Procedure Laterality Date  . FOOT SURGERY    . MULTIPLE TOOTH EXTRACTIONS Bilateral 2017  . TUBAL LIGATION     Family History:  Family History  Problem Relation Age of Onset  . Mental illness Neg Hx    Family Psychiatric  History: Please see H&P.  Social History:  History  Alcohol Use No     History  Drug Use  . Types: "Crack" cocaine,  Cocaine, Marijuana    Comment: 60 dollars per day    Social History   Social History  . Marital status: Single    Spouse name: N/A  . Number of children: N/A  . Years of education: N/A   Social History Main Topics  . Smoking status: Current Every Day Smoker    Packs/day: 0.50    Types: Cigarettes  . Smokeless tobacco: Never Used     Comment: Refused  . Alcohol use No  . Drug use: Yes    Types: "Crack" cocaine, Cocaine, Marijuana     Comment: 60 dollars per day  . Sexual activity: Yes    Birth control/ protection: Surgical   Other Topics Concern  . None   Social History Narrative  . None   Additional Social History:    Pain Medications: pt denies Prescriptions: pt denies Over the Counter: pt denies History of alcohol / drug use?: Yes Longest period of sobriety (when/how long): unknown Negative Consequences of Use: Financial Withdrawal Symptoms: Agitation, Irritability, Nausea / Vomiting, Sweats Name of Substance 1: crack cocaine 1 - Age of First Use: 22 1 - Amount (size/oz):  ($60) 1 - Frequency: daily 1 - Duration: 3 months 1 - Last Use / Amount: 5/4, $100-150 Name of Substance 2: Alcohol (denied in admission) 2 - Age of First Use: 15 2 - Amount (size/oz): 3 drinks 2 - Frequency: 1x week 2 - Duration: ongoing 2 - Last Use / Amount: 5/2  Sleep: Fair  Appetite:  Fair  Current Medications: Current Facility-Administered Medications  Medication Dose Route Frequency Provider Last Rate Last Dose  . acetaminophen (TYLENOL) tablet 650 mg  650 mg Oral Q4H PRN Fransisca Kaufmann A, NP   650 mg at 07/21/16 1412  . alum & mag hydroxide-simeth (MAALOX/MYLANTA) 200-200-20 MG/5ML suspension 30 mL  30 mL Oral PRN Fransisca Kaufmann A, NP      . asenapine (SAPHRIS) sublingual tablet 5 mg  5 mg Sublingual BID PRN Darianna Amy, MD      . hydrOXYzine (ATARAX/VISTARIL) tablet 50 mg  50 mg Oral Q6H PRN Fransisca Kaufmann A, NP   50 mg at 07/21/16 0817  . ibuprofen  (ADVIL,MOTRIN) tablet 600 mg  600 mg Oral Q8H PRN Fransisca Kaufmann A, NP   600 mg at 07/21/16 0410  . [START ON 07/22/2016] lurasidone (LATUDA) tablet 40 mg  40 mg Oral Q breakfast Jermie Hippe, MD      . lurasidone (LATUDA) tablet 40 mg  40 mg Oral Q supper Pegi Milazzo, MD      . magnesium hydroxide (MILK OF MAGNESIA) suspension 30 mL  30 mL Oral Daily PRN Enslee Bibbins, MD      . nicotine (NICODERM CQ - dosed in mg/24 hours) patch 21 mg  21 mg Transdermal Daily PRN Fransisca Kaufmann A, NP      . nicotine (NICODERM CQ - dosed in mg/24 hours) patch 21 mg  21 mg Transdermal Daily Cobos, Fernando A, MD      . ondansetron (ZOFRAN) tablet 4 mg  4 mg Oral Q8H PRN Thermon Leyland, NP      . Melene Muller ON 07/22/2016] sertraline (ZOLOFT) tablet 75 mg  75 mg Oral Daily Malva Diesing, MD      . traZODone (DESYREL) tablet 150 mg  150 mg Oral QHS PRN Thermon Leyland, NP   150 mg at 07/20/16 2122    Lab Results: No results found for this or any previous visit (from the past 48 hour(s)).  Blood Alcohol level:  Lab Results  Component Value Date   ETH <5 07/17/2016   ETH <5 06/06/2016    Metabolic Disorder Labs: Lab Results  Component Value Date   HGBA1C 5.6 03/26/2016   MPG 114 03/26/2016   Lab Results  Component Value Date   PROLACTIN 5.8 03/26/2016   Lab Results  Component Value Date   CHOL 151 03/26/2016   TRIG 149 03/26/2016   HDL 40 (L) 03/26/2016   CHOLHDL 3.8 03/26/2016   VLDL 30 03/26/2016   LDLCALC 81 03/26/2016    Physical Findings: AIMS: Facial and Oral Movements Muscles of Facial Expression: None, normal Lips and Perioral Area: None, normal Jaw: None, normal Tongue: None, normal,Extremity Movements Upper (arms, wrists, hands, fingers): None, normal Lower (legs, knees, ankles, toes): None, normal, Trunk Movements Neck, shoulders, hips: None, normal, Overall Severity Severity of abnormal movements (highest score from questions above): None, normal Incapacitation due to abnormal  movements: None, normal Patient's awareness of abnormal movements (rate only patient's report): No Awareness, Dental Status Current problems with teeth and/or dentures?: No Does patient usually wear dentures?: No  CIWA:  CIWA-Ar Total: 1 COWS:  COWS Total Score: 1  Musculoskeletal: Strength & Muscle Tone: within normal limits Gait & Station: normal Patient leans: N/A  Psychiatric Specialty Exam: Physical Exam  Nursing note and vitals reviewed.   Review of Systems  Psychiatric/Behavioral: Positive for substance abuse. The patient is nervous/anxious.   All other systems reviewed and  are negative.   Blood pressure 105/69, pulse 90, temperature 98 F (36.7 C), temperature source Oral, resp. rate 16, height 5\' 6"  (1.676 m), weight 98 kg (216 lb), last menstrual period 07/13/2016, SpO2 100 %.Body mass index is 34.86 kg/m.  General Appearance: Casual  Eye Contact:  Fair  Speech:  Normal Rate  Volume:  Normal  Mood:  Anxious and Depressed  Affect:  Appropriate  Thought Process:  Goal Directed and Descriptions of Associations: Circumstantial  Orientation:  Full (Time, Place, and Person)  Thought Content:  Paranoid Ideation and Rumination  Suicidal Thoughts:  No  Homicidal Thoughts:  No  Memory:  Immediate;   Fair Recent;   Fair Remote;   Fair  Judgement:  Impaired  Insight:  Fair  Psychomotor Activity:  Restlessness  Concentration:  Concentration: Fair and Attention Span: Fair  Recall:  [employer]  Fund of Knowledge:  Fair  Language:  Fair  Akathisia:  No  Handed:  Right  AIMS (if indicated):     Assets:  Communication Skills Desire for Improvement  ADL's:  Intact  Cognition:  WNL  Sleep:  Number of Hours: 6    MDD (major depressive disorder), recurrent, severe, with psychosis (HCC) unstable   Will continue today 07/21/16 plan as below except where it is noted.   Treatment Plan Summary:Patient is [age] y old , has a hx of depression, ptsd, cocaine use disorder , who  presented with worsening anxiety, depression and SI. Pt is making some improvement , however continues to need medication readjustments for anxiety issues and paranoia. Will continue treatment. Daily contact with patient to assess and evaluate symptoms and progress in treatment, Medication management and Plan see below  Will increase Latuda to 40 mg po bid with meals for psychosis/mood sx. Will increase Zoloft to 75 mg po daily for affective sx. Will continue Trazodone 150 mg po qhs prn for insomnia. Add Saphris 5 mg sl bid prn for anxiety sx/agitation. EKG reviewed - qtc wnl. CSW will continue to work on disposition.   Miron Marxen, MD 07/21/2016, 2:46 PM

## 2016-07-21 NOTE — Progress Notes (Signed)
Recreation Therapy Notes  Date: 07/21/16 Time: 1000 Location: [address] Dayroom  Group Topic: Leisure Education, Goal Setting  Goal Area(s) Addresses:  Patient will be able to identify at least 3 life goals.  Patient will be able to identify benefit of investing in life goals.  Patient will be able to identify benefit of setting life goals.   Behavioral Response:  Minimal  Intervention: Goals sheet, pencils  Activity: Life Goals.  Patients were to determine what they do well, where they need to improve and set Le goal to make that improvement.  Patients had to set goals in the areas of family, friends, work/school, spirituality, body and mental health.  Education:  Discharge Planning, PharmacologistCoping Skills, Leisure Education   Education Outcome: Acknowledges Education/In Group Clarification Provided/Needs Additional Education  Clinical Observations:  Pt completed sheet but left before processing and did not return.   Caroll RancherMarjette Harlem Bula, LRT/CTRS         Caroll RancherLindsay, Amy Le 07/21/2016 12:24 PM

## 2016-07-21 NOTE — Progress Notes (Signed)
D: Pt presents with animated affect and anxious mood. Pt rates anxiety 9/10. Depression 6/10. Pt verbalized paranoia, feeling like someone is out to get her. Pt denies SI. Pt requesting for anxiety med to be increased. Writer informed pt that Vistaril 50 mg every six hours is ordered for anxiety. Dr. Elna BreslowEappen made aware of pt request. Pt seeking long-term substance abuse tx.  A: Medications reviewed with pt. Medications administered as ordered per MD. Verbal support provided. Pt encouraged to attend groups. 15 minute checks performed for safety.  R: Pt compliant with tx.

## 2016-07-21 NOTE — [facility] Group Notes (Signed)
[facility] Group Notes:  (Counselor/Nursing/MHT/Case Management/Adjunct)  07/21/2016 1:15PM  Type of Therapy:  Group Therapy  Participation Level:  Active  Participation Quality:  Appropriate  Affect:  Flat  Cognitive:  Oriented  Insight:  Improving  Engagement in Group:  Limited  Engagement in Therapy:  Limited  Modes of Intervention:  Discussion, Exploration and Socialization  Summary of Progress/Problems: The topic for group was balance in life.  Pt participated in the discussion about when their life was in balance and out of balance and how this feels.  Pt discussed ways to get back in balance and short term goals they can work on to get where they want to be. Stayed for the first 15 minutes. Stated she feels unbalanced because "my concentration is better than it was, but still not where it needs to be."  Left, and was observed walking in the hall and looking in the group room window periodically.  Returned with about 8 minutes left to go.   Ida Rogueorth, Jamillia Closson B 07/21/2016 3:23 PM

## 2016-07-21 NOTE — Tx Team (Signed)
Interdisciplinary Treatment and Diagnostic Plan Update  07/21/2016 Time of Session: 9:55 AM  Steph Cheadle MRN: [id number]  Principal Diagnosis: MDD (major depressive disorder), recurrent, severe, with psychosis ([place])  Secondary Diagnoses: Principal Problem:   MDD (major depressive disorder), recurrent, severe, with psychosis ([place]) Active Problems:   Cocaine use disorder, severe, dependence ([place])   Cannabis use disorder, moderate, dependence ([place])   Current Medications:  Current Facility-Administered Medications  Medication Dose Route Frequency Provider Last Rate Last Dose  . acetaminophen (TYLENOL) tablet 650 mg  650 mg Oral Q4H PRN Niel Hummer, NP   650 mg at 07/20/16 2025  . alum & mag hydroxide-simeth (MAALOX/MYLANTA) 200-200-20 MG/5ML suspension 30 mL  30 mL Oral PRN Elmarie Shiley A, NP      . hydrOXYzine (ATARAX/VISTARIL) tablet 50 mg  50 mg Oral Q6H PRN Elmarie Shiley A, NP   50 mg at 07/21/16 0817  . ibuprofen (ADVIL,MOTRIN) tablet 600 mg  600 mg Oral Q8H PRN Elmarie Shiley A, NP   600 mg at 07/21/16 0410  . lurasidone (LATUDA) tablet 40 mg  40 mg Oral Q breakfast Elmarie Shiley A, NP   40 mg at 07/21/16 0817  . magnesium hydroxide (MILK OF MAGNESIA) suspension 30 mL  30 mL Oral Daily PRN Eappen, Saramma, MD      . nicotine (NICODERM CQ - dosed in mg/24 hours) patch 21 mg  21 mg Transdermal Daily PRN Elmarie Shiley A, NP      . nicotine (NICODERM CQ - dosed in mg/24 hours) patch 21 mg  21 mg Transdermal Daily Cobos, Fernando A, MD      . OLANZapine (ZYPREXA) tablet 5 mg  5 mg Oral TID PRN Ursula Alert, MD       Or  . OLANZapine (ZYPREXA) injection 5 mg  5 mg Intramuscular TID PRN Ursula Alert, MD      . ondansetron (ZOFRAN) tablet 4 mg  4 mg Oral Q8H PRN Elmarie Shiley A, NP      . sertraline (ZOLOFT) tablet 50 mg  50 mg Oral Daily Elmarie Shiley A, NP   50 mg at 07/21/16 0817  . traZODone (DESYREL) tablet 150 mg  150 mg Oral QHS PRN Niel Hummer, NP   150 mg at 07/20/16 2122     PTA Medications: Prescriptions Prior to Admission  Medication Sig Dispense Refill Last Dose  . hydrOXYzine (ATARAX/VISTARIL) 50 MG tablet Take 1 tablet (50 mg total) by mouth every 6 (six) hours as needed for anxiety. 30 tablet 0 Past Month at Unknown time  . lurasidone (LATUDA) 40 MG TABS tablet Take 1 tablet (40 mg total) by mouth daily with breakfast. 30 tablet 0 Past Month at Unknown time  . sertraline (ZOLOFT) 50 MG tablet Take 1 tablet (50 mg total) by mouth daily. 30 tablet 0 Past Month at Unknown time  . traZODone (DESYREL) 150 MG tablet Take 1 tablet (150 mg total) by mouth at bedtime as needed for sleep. 30 tablet 0 Past Month at Unknown time    Treatment Modalities: Medication Management, Group therapy, Case management,  1 to 1 session with clinician, Psychoeducation, Recreational therapy.   Physician Treatment Plan for Primary Diagnosis: MDD (major depressive disorder), recurrent, severe, with psychosis ([place]) Long Term Goal(s): Improvement in symptoms so as ready for discharge  Short Term Goals: Ability to identify changes in lifestyle to reduce recurrence of condition will improve Ability to verbalize feelings will improve Ability to disclose and discuss suicidal ideas Ability to demonstrate  self-control will improve Ability to identify and develop effective coping behaviors will improve Compliance with prescribed medications will improve Ability to identify triggers associated with substance abuse/mental health issues will improve  Medication Management: Evaluate patient's response, side effects, and tolerance of medication regimen.  Therapeutic Interventions: 1 to 1 sessions, Unit Group sessions and Medication administration.  Evaluation of Outcomes: Progressing  Physician Treatment Plan for Secondary Diagnosis: Principal Problem:   MDD (major depressive disorder), recurrent, severe, with psychosis ([place]) Active Problems:   Cocaine use disorder, severe, dependence  ([place])   Cannabis use disorder, moderate, dependence ([place])   Long Term Goal(s): Improvement in symptoms so as ready for discharge  Short Term Goals: Ability to identify changes in lifestyle to reduce recurrence of condition will improve Ability to verbalize feelings will improve Ability to disclose and discuss suicidal ideas Ability to demonstrate self-control will improve Ability to identify and develop effective coping behaviors will improve Compliance with prescribed medications will improve Ability to identify triggers associated with substance abuse/mental health issues will improve  Medication Management: Evaluate patient's response, side effects, and tolerance of medication regimen.  Therapeutic Interventions: 1 to 1 sessions, Unit Group sessions and Medication administration.  Evaluation of Outcomes: Progressing   RN Treatment Plan for Primary Diagnosis: MDD (major depressive disorder), recurrent, severe, with psychosis ([place]) Long Term Goal(s): Knowledge of disease and therapeutic regimen to maintain health will improve  Short Term Goals: Ability to identify and develop effective coping behaviors will improve and Compliance with prescribed medications will improve  Medication Management: RN will administer medications as ordered by provider, will assess and evaluate patient's response and provide education to patient for prescribed medication. RN will report any adverse and/or side effects to prescribing provider.  Therapeutic Interventions: 1 on 1 counseling sessions, Psychoeducation, Medication administration, Evaluate responses to treatment, Monitor vital signs and CBGs as ordered, Perform/monitor CIWA, COWS, AIMS and Fall Risk screenings as ordered, Perform wound care treatments as ordered.  Evaluation of Outcomes: Progressing   LCSW Treatment Plan for Primary Diagnosis: MDD (major depressive disorder), recurrent, severe, with psychosis ([place]) Long Term Goal(s): Safe  transition to appropriate next level of care at discharge, Engage patient in therapeutic group addressing interpersonal concerns.  Short Term Goals: Engage patient in aftercare planning with referrals and resources  Therapeutic Interventions: Assess for all discharge needs, 1 to 1 time with Social worker, Explore available resources and support systems, Assess for adequacy in community support network, Educate family and significant other(s) on suicide prevention, Complete Psychosocial Assessment, Interpersonal group therapy.  Evaluation of Outcomes: Not met.  Pt expressing interest in [facility], [facility].   Progress in Treatment: Attending groups: Yes Participating in groups: Minimally Taking medication as prescribed: Yes Toleration medication: Yes, no side effects reported at this time Family/Significant other contact made: No Patient understands diagnosis: Yes AEB asking for help with homelessness Discussing patient identified problems/goals with staff: Yes Medical problems stabilized or resolved: Yes Denies suicidal/homicidal ideation: Yes Issues/concerns per patient self-inventory: None Other: N/A  New problem(s) identified: None identified at this time.   New Short Term/Long Term Goal(s): None identified at this time.   Discharge Plan or Barriers:   Reason for Continuation of Hospitalization: Depression Hallucinations  Medication stabilization   Estimated Length of Stay: 3-5 days  Attendees: Patient: 07/21/2016  9:55 AM  Physician: Ursula Alert, MD 07/21/2016  9:55 AM  Nursing: Darrol Angel, RN 07/21/2016  9:55 AM  RN Care Manager: Lars Pinks, RN 07/21/2016  9:55 AM  Social  Worker: Ripley Fraise 07/21/2016  9:55 AM  Recreational Therapist: Marjette  07/21/2016  9:55 AM  Other: Norberto Sorenson 07/21/2016  9:55 AM  Other:  07/21/2016  9:55 AM    Scribe for Treatment Team:  Roque Lias LCSW 07/21/2016 9:55 AM

## 2016-07-22 DIAGNOSIS — F17210 Nicotine dependence, cigarettes, uncomplicated: Secondary | ICD-10-CM

## 2016-07-22 DIAGNOSIS — F1220 Cannabis dependence, uncomplicated: Secondary | ICD-10-CM

## 2016-07-22 NOTE — Progress Notes (Signed)
  DATA ACTION RESPONSE  Objective- Pt. is visible in the hallway, requesting bedtime meds. Pt. had just woke up from a nap.  Presents with a sad affect and mood. Was minimal and cautious with interaction.  Subjective- Denies having any SI/HI/AVH/Pain at this time. Pt. states " It has been a good day, I slept well".Continues to be cooperative and remain safe on the unit.  1:1 interaction in private to establish rapport. Encouragement, education, & support given from staff.  PRN Vistaril and Trazodone  requested and will re-eval accordingly.   Safety maintained with Q 15 checks. Continues to follow treatment plan and will monitor closely. No additonal questions/concerns noted.

## 2016-07-22 NOTE — Progress Notes (Signed)
[facility]  07/22/2016 2:25 PM Amy RankinJacquetta Le  MRN:  [id number]  Subjective:  Patient states " It is going really well today"  Objective:Patient seen and chart reviewed. Discussed patient with treatment team. Patient says she is doing well today. She says her medications are now helping. She denies any adverse effects from medications, Denies any SIHI, AVH. Says she hoping to get into the [facility] program for substance abuse abusers. She is visible on the unit, attending group sessions.  Has been compliant on medications. She appears to be in no apparent distress.  Principal Problem: MDD (major depressive disorder), recurrent, severe, with psychosis (H[facility])  Diagnosis:   Patient Active Problem List   Diagnosis Date Noted  . Cocaine use disorder, severe, dependence (HCC) [F14.20] 07/20/2016  . MDD (major depressive disorder), recurrent, severe, with psychosis ([facility]) [F33.3] 07/20/2016  . Cannabis use disorder, moderate, dependence (HCC) [F12.20] 07/20/2016  . Severe episode of recurrent major depressive disorder, without psychotic features (HCC) [F33.2]   . PTSD (post-traumatic stress disorder) [F43.10] 03/25/2016  . 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  Past Medical History:  Past Medical History:  Diagnosis Date  . Bipolar 1 disorder (HCC)   . BV (bacterial vaginosis)   . Chlamydia   . Trichomonas     Past Surgical History:  Procedure Laterality Date  . FOOT SURGERY    . MULTIPLE TOOTH EXTRACTIONS Bilateral 2017  . TUBAL LIGATION     Family History:  Family History  Problem Relation Age of Onset  . Mental illness Neg Hx    Family Psychiatric  History: Please see H&P.  Social History:  History  Alcohol Use No     History  Drug Use  . Types: "Crack" cocaine, Cocaine, Marijuana    Comment: 60  dollars per day    Social History   Social History  . Marital status: Single    Spouse name: N/A  . Number of children: N/A  . Years of education: N/A   Social History Main Topics  . Smoking status: Current Every Day Smoker    Packs/day: 0.50    Types: Cigarettes  . Smokeless tobacco: Never Used     Comment: Refused  . Alcohol use No  . Drug use: Yes    Types: "Crack" cocaine, Cocaine, Marijuana     Comment: 60 dollars per day  . Sexual activity: Yes    Birth control/ protection: Surgical   Other Topics Concern  . None   Social History Narrative  . None   Additional Social History:    Pain Medications: pt denies Prescriptions: pt denies Over the Counter: pt denies History of alcohol / drug use?: Yes Longest period of sobriety (when/how long): unknown Negative Consequences of Use: Financial Withdrawal Symptoms: Agitation, Irritability, Nausea / Vomiting, Sweats Name of Substance 1: crack cocaine 1 - Age of First Use: 22 1 - Amount (size/oz):  ($60) 1 - Frequency: daily 1 - Duration: 3 months 1 - Last Use / Amount: 5/4, $100-150 Name of Substance 2: Alcohol (denied in admission) 2 - Age of First Use: 15 2 - Amount (size/oz): 3 drinks 2 - Frequency: 1x week 2 - Duration: ongoing 2 - Last Use / Amount: 5/2  Sleep: Good  Appetite:  Fair  Current Medications: Current Facility-Administered Medications  Medication Dose Route Frequency Provider Last Rate Last Dose  .  acetaminophen (TYLENOL) tablet 650 mg  650 mg Oral Q4H PRN Fransisca Kaufmann A, NP   650 mg at 07/21/16 1412  . alum & mag hydroxide-simeth (MAALOX/MYLANTA) 200-200-20 MG/5ML suspension 30 mL  30 mL Oral PRN Fransisca Kaufmann A, NP      . asenapine (SAPHRIS) sublingual tablet 5 mg  5 mg Sublingual BID PRN Jomarie Longs, MD   5 mg at 07/21/16 1451  . hydrOXYzine (ATARAX/VISTARIL) tablet 50 mg  50 mg Oral Q6H PRN Fransisca Kaufmann A, NP   50 mg at 07/22/16 1134  . ibuprofen (ADVIL,MOTRIN) tablet 600 mg  600 mg Oral  Q8H PRN Fransisca Kaufmann A, NP   600 mg at 07/22/16 1328  . lurasidone (LATUDA) tablet 40 mg  40 mg Oral Q breakfast Jomarie Longs, MD   40 mg at 07/22/16 0848  . lurasidone (LATUDA) tablet 40 mg  40 mg Oral Q supper Jomarie Longs, MD   40 mg at 07/21/16 1708  . magnesium hydroxide (MILK OF MAGNESIA) suspension 30 mL  30 mL Oral Daily PRN Eappen, Saramma, MD      . nicotine (NICODERM CQ - dosed in mg/24 hours) patch 21 mg  21 mg Transdermal Daily PRN Fransisca Kaufmann A, NP      . nicotine (NICODERM CQ - dosed in mg/24 hours) patch 21 mg  21 mg Transdermal Daily Cobos, Fernando A, MD      . ondansetron (ZOFRAN) tablet 4 mg  4 mg Oral Q8H PRN Fransisca Kaufmann A, NP      . sertraline (ZOLOFT) tablet 75 mg  75 mg Oral Daily Jomarie Longs, MD   75 mg at 07/22/16 0848  . traZODone (DESYREL) tablet 150 mg  150 mg Oral QHS PRN Thermon Leyland, NP   150 mg at 07/21/16 2244    Lab Results: No results found for this or any previous visit (from the past 48 hour(s)).  Blood Alcohol level:  Lab Results  Component Value Date   ETH <5 07/17/2016   ETH <5 06/06/2016    Metabolic Disorder Labs: Lab Results  Component Value Date   HGBA1C 5.6 03/26/2016   MPG 114 03/26/2016   Lab Results  Component Value Date   PROLACTIN 5.8 03/26/2016   Lab Results  Component Value Date   CHOL 151 03/26/2016   TRIG 149 03/26/2016   HDL 40 (L) 03/26/2016   CHOLHDL 3.8 03/26/2016   VLDL 30 03/26/2016   LDLCALC 81 03/26/2016    Physical Findings: AIMS: Facial and Oral Movements Muscles of Facial Expression: None, normal Lips and Perioral Area: None, normal Jaw: None, normal Tongue: None, normal,Extremity Movements Upper (arms, wrists, hands, fingers): None, normal Lower (legs, knees, ankles, toes): None, normal, Trunk Movements Neck, shoulders, hips: None, normal, Overall Severity Severity of abnormal movements (highest score from questions above): None, normal Incapacitation due to abnormal movements: None,  normal Patient's awareness of abnormal movements (rate only patient's report): No Awareness, Dental Status Current problems with teeth and/or dentures?: No Does patient usually wear dentures?: No  CIWA:  CIWA-Ar Total: 1 COWS:  COWS Total Score: 1  Musculoskeletal: Strength & Muscle Tone: within normal limits Gait & Station: normal Patient leans: N/A  Psychiatric Specialty Exam: Physical Exam  Nursing note and vitals reviewed.   Review of Systems  Psychiatric/Behavioral: Positive for substance abuse. The patient is nervous/anxious.   All other systems reviewed and are negative.   Blood pressure (!) 121/105, pulse 72, temperature 98.4 F (36.9 C), temperature source Oral,  resp. rate 16, height 5\' 6"  (1.676 m), weight 98 kg (216 lb), last menstrual period 07/13/2016, SpO2 100 %.Body mass index is 34.86 kg/m.  General Appearance: Casual  Eye Contact:  Fair  Speech:  Normal Rate  Volume:  Normal  Mood:  Anxious and Depressed  Affect:  Appropriate  Thought Process:  Goal Directed and Descriptions of Associations: Circumstantial  Orientation:  Full (Time, Place, and Person)  Thought Content:  Paranoid Ideation and Rumination  Suicidal Thoughts:  No  Homicidal Thoughts:  No  Memory:  Immediate;   Fair Recent;   Fair Remote;   Fair  Judgement:  Impaired  Insight:  Fair  Psychomotor Activity:  Restlessness  Concentration:  Concentration: Fair and Attention Span: Fair  Recall:  [employer] of Knowledge:  Fair  Language:  Fair  Akathisia:  No  Handed:  Right  AIMS (if indicated):     Assets:  Communication Skills Desire for Improvement  ADL's:  Intact  Cognition:  WNL  Sleep:  Number of Hours: 6.25    MDD (major depressive disorder), recurrent, severe, with psychosis (HCC) unstable   Will continue today 07/22/16 plan as below except where it is noted.  Treatment Plan Summary: Patient is 17 y old , has a hx of depression, ptsd, cocaine use disorder , who presented with  worsening anxiety, depression and SI. Pt is making some improvement , however continues to need medication readjustments for anxiety issues and paranoia. Will continue treatment. Daily contact with patient to assess and evaluate symptoms and progress in treatment, Medication management and Plan see below  Will continue Latuda 40 mg po bid with meals for psychosis/mood sx. Will continue Zoloft 75 mg po daily for affective sx. Will continue Trazodone 150 mg po qhs prn for insomnia. Will continue Saphris 5 mg sl bid prn for anxiety sx/agitation. EKG reviewed - qtc wnl. CSW will continue to work on disposition.  Sanjuana Kava, NP, PMHNP, FNP-BC 07/22/2016, 2:25 PMPatient ID: Amy Le, female   DOB: 1975/05/04, 41 y.o.   MRN: [id number]

## 2016-07-22 NOTE — Progress Notes (Signed)
Patient ID: [name], female   DOB: 05-26-1975, 41 y.o.   MRN: [id number] D: Client reports of her day "I been okay, just my surroundings been hostile, I went to my room and shut down" "things better now y'all here now, another shift" A: Writer provided emotional support commended client on using coping skills knowing when circumstances becomes unbearable it's okay to go to a safe place, mentally and physically. Staff will monitor q4315min for safety. R: Client is safe on the unit.

## 2016-07-22 NOTE — Progress Notes (Signed)
DAR NOTE: Patient presents with flat affect and depressed mood.  Reports pain, auditory and visual hallucinations.  Reports suicidal thoughts but contracts for safety.  Reports withdrawal symptoms of tremors, agitation and irritability.  Rates depression at 6, hopelessness at 6, and anxiety at 6.  Maintained on routine safety checks.  Medications given as prescribed.  Support and encouragement offered as needed.  States goal for today is "rehab."  Patient is withdrawn and isolative to her room.  Motrin 600 mg given for complain of pain with good effect.  Vistaril 50 mg given for anxiety with good effect.

## 2016-07-22 NOTE — Progress Notes (Signed)
Recreation Therapy Notes  Date: 07/22/16 Time: 1000 Location: 300 Hall Dayroom  Group Topic: Coping Skills  Goal Area(s) Addresses:  Patient will be able to positive coping skills. Patient will be able to identify benefits of using positive coping skills. Patient will be able to benefits of using coping skills post d/c.  Behavioral Response: Minimal  Intervention: Mindmap, pencils  Activity: Mindmap.  Patients were given a blank mindmap.  Patients and LRT filled in the first eight boxes together.  Patients were to then identify three coping skills for each of the stressors identified by the patients and LRT.  Once patients identified their coping skills, LRT would fill in the coping skills on the board so patients could fill in any blank spaces they may have had on their sheets.  Education: PharmacologistCoping Skills, Building control surveyorDischarge Planning.   Education Outcome: Acknowledges understanding/In group clarification offered/Needs additional education.   Clinical Observations/Feedback: Pt filled out sheet but left before processing and did not return.    Caroll RancherMarjette Basilio [place], LRT/CTRS         Caroll RancherLindsay, Merrissa Giacobbe A 07/22/2016 11:39 AM

## 2016-07-22 NOTE — [facility] Group Notes (Signed)
[facility]  07/22/2016 , 12:59 PM    Type of Therapy:  [facility] [employer] Presentation  Participation Level:  Active  Participation Quality:  Attentive  Affect:  Blunted  Cognitive:  Oriented  Insight:  Limited  Engagement in Therapy:  Engaged  Modes of Intervention:  Discussion, Education and Socialization  Summary of Progress/Problems:  Amy Le from [employer] came to present his recovery story and play the guitar.  Invited.  Chose to not attend.  Amy Le, Amy Le 07/22/2016 , 12:59 PM

## 2016-07-23 NOTE — Progress Notes (Signed)
Nursing Note 07/23/2016 [id number]  Data Reports sleeping fair with PRN sleep med.  Rates depression 7/10, hopelessness 6/10, and anxiety 8/10. Affect blunted but appropriate.  Denies HI, SI and AVH.  Attending groups, spends free time in day area.  Polite and appropriate with staff and peers.  Action Spoke with patient 1:1, nurse offered support to patient throughout shift.  Continues to be monitored on 15 minute checks for safety.  Response Remains safe and appropriate on unit.

## 2016-07-23 NOTE — Progress Notes (Signed)
D.  Pt pleasant on approach, was awakened by new roommate coming into room.  Pt requested medication for sleep. Pt had been in bed during beginning of shift.  Pt denies SI/HI/hallucinations at this time.  A.  Support and encouragement offered, medication given as ordered  R. Pt remains safe on the unit, will continue to monitor.

## 2016-07-23 NOTE — Plan of Care (Signed)
Problem: Coping: Goal: Ability to identify and develop effective coping behavior will improve Outcome: Progressing Ability to identify and develop effective coping behavior will improve AEB client recognizing stressors and using coping skills. "I been okay, just my surroundings been hostile" "I went to my room and shut down"

## 2016-07-23 NOTE — Progress Notes (Signed)
Recreation Therapy Notes  Date: 07/23/16 Time: 1000 Location: 300 Hall Dayroom  Group Topic: Stress Management  Goal Area(s) Addresses:  Patient will verbalize importance of using healthy stress management.  Patient will identify positive emotions associated with healthy stress management.   Behavioral Response: Minimal  Intervention: Stress Management  Activity :  Peaceful Waves, Music.  LRT introduced the stress management techniques of guided imagery and music.  Patients were to listen and follow along as LRT read script to participate in guided imagery.  Patients also listened to music, socialized and moved along with the music.  Education:  Stress Management, Discharge Planning.   Education Outcome: Acknowledges edcuation/In group clarification offered/Needs additional education  Clinical Observations/Feedback: Pt sat quietly at the table and colored during the guided imagery.  Pt was respectful of peers who were meditating.  Pt was bright and appropriate.   Caroll RancherMarjette Allea Kassner, LRT/CTRS         Lillia AbedLindsay, Arthi Mcdonald A 07/23/2016 11:22 AM

## 2016-07-23 NOTE — Progress Notes (Signed)
Adult Psychoeducational Group Note  Date:  07/23/2016 Time:  1:55 AM  Group Topic/Focus:  Wrap-Up Group:   The focus of this group is to help patients review their daily goal of treatment and discuss progress on daily workbooks.  Participation Level:  Active  Participation Quality:  Appropriate, Attentive and Sharing  Affect:  Appropriate  Cognitive:  Appropriate  Insight: Appropriate and Improving  Engagement in Group:  Developing/Improving  Modes of Intervention:  Discussion and Support  Additional Comments:  Pt attended wrap group. Pt stated she is going to a program when she discharge. Pt shared she worked on his coping skills for when she is in a difficult situation such as other people adding on stressors of being upset. Karleen HampshireFox, Bernardine Langworthy Brittini 07/23/2016, 1:55 AM

## 2016-07-23 NOTE — [facility] Group Notes (Signed)
[facility] LCSW Group Therapy  07/23/2016  1:05 PM  Type of Therapy:  Group therapy  Participation Level:  Active  Participation Quality:  Attentive  Affect:  Flat  Cognitive:  Oriented  Insight:  Limited  Engagement in Therapy:  Limited  Modes of Intervention:  Discussion, Socialization  Summary of Progress/Problems:  Chaplain was here to lead a group on themes of hope and courage. "For me courage, is the determination to follow through going to rehab, and getting it right this time.  I've done it before, but it hasn't stuck. My children and grandchildren are my motivation.  I want to have a closer relationship with them. They call me cupcake."  Left early and did not return.  Daryel Geraldorth, Elijan Googe B 07/23/2016 11:06 AM

## 2016-07-23 NOTE — Progress Notes (Signed)
[facility] MD Progress Note  07/23/2016 3:08 PM Laniqua Torrens  MRN:  [id number]  Subjective:  Patient states "I'm doing well. I don't have much of a complaint today"  Objective: Patient seen and chart reviewed. Discussed patient with treatment team. Patient says she is doing well today. She says her medications are now helping. She denies any adverse effects from medications, Denies any SIHI, AVH. Says she hoping to get into the [facility] program for substance abuse abusers. She is visible on the unit, attending group sessions.  Has been compliant on medications. She appears to be in no apparent distress. Does not appear to be responding to any internal stimuli.  Principal Problem: MDD (major depressive disorder), recurrent, severe, with psychosis (HCC)  Diagnosis:   Patient Active Problem List   Diagnosis Date Noted  . Cocaine use disorder, severe, dependence (HCC) [F14.20] 07/20/2016  . MDD (major depressive disorder), recurrent, severe, with psychosis (HCC) [F33.3] 07/20/2016  . Cannabis use disorder, moderate, dependence (HCC) [F12.20] 07/20/2016  . Severe episode of recurrent major depressive disorder, without psychotic features (HCC) [F33.2]   . PTSD (post-traumatic stress disorder) [F43.10] 03/25/2016  . 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  Past Medical History:  Past Medical History:  Diagnosis Date  . Bipolar 1 disorder (HCC)   . BV (bacterial vaginosis)   . Chlamydia   . Trichomonas     Past Surgical History:  Procedure Laterality Date  . FOOT SURGERY    . MULTIPLE TOOTH EXTRACTIONS Bilateral 2017  . TUBAL LIGATION     Family History:  Family History  Problem Relation Age of Onset  . Mental illness Neg Hx    Family Psychiatric  History: Please see H&P.  Social History:  History  Alcohol Use No      History  Drug Use  . Types: "Crack" cocaine, Cocaine, Marijuana    Comment: 60 dollars per day    Social History   Social History  . Marital status: Single    Spouse name: N/A  . Number of children: N/A  . Years of education: N/A   Social History Main Topics  . Smoking status: Current Every Day Smoker    Packs/day: 0.50    Types: Cigarettes  . Smokeless tobacco: Never Used     Comment: Refused  . Alcohol use No  . Drug use: Yes    Types: "Crack" cocaine, Cocaine, Marijuana     Comment: 60 dollars per day  . Sexual activity: Yes    Birth control/ protection: Surgical   Other Topics Concern  . None   Social History Narrative  . None   Additional Social History:    Pain Medications: pt denies Prescriptions: pt denies Over the Counter: pt denies History of alcohol / drug use?: Yes Longest period of sobriety (when/how long): unknown Negative Consequences of Use: Financial Withdrawal Symptoms: Agitation, Irritability, Nausea / Vomiting, Sweats Name of Substance 1: crack cocaine 1 - Age of First Use: 22 1 - Amount (size/oz):  ($60) 1 - Frequency: daily 1 - Duration: 3 months 1 - Last Use / Amount: 5/4, $100-150 Name of Substance 2: Alcohol (denied in admission) 2 - Age of First Use: 15 2 - Amount (size/oz): 3 drinks 2 - Frequency: 1x week 2 - Duration: ongoing 2 - Last Use / Amount: 5/2  Sleep: Good  Appetite:  Fair  Current  Medications: Current Facility-Administered Medications  Medication Dose Route Frequency Provider Last Rate Last Dose  . acetaminophen (TYLENOL) tablet 650 mg  650 mg Oral Q4H PRN Fransisca Kaufmannavis, Laura A, NP   650 mg at 07/21/16 1412  . alum & mag hydroxide-simeth (MAALOX/MYLANTA) 200-200-20 MG/5ML suspension 30 mL  30 mL Oral PRN Fransisca Kaufmannavis, Laura A, NP      . asenapine (SAPHRIS) sublingual tablet 5 mg  5 mg Sublingual BID PRN Eappen, Levin BaconSaramma, MD   5 mg at 07/21/16 1451  . hydrOXYzine (ATARAX/VISTARIL) tablet 50 mg  50 mg Oral Q6H PRN Fransisca Kaufmannavis, Laura A,  NP   50 mg at 07/23/16 1117  . ibuprofen (ADVIL,MOTRIN) tablet 600 mg  600 mg Oral Q8H PRN Fransisca Kaufmannavis, Laura A, NP   600 mg at 07/22/16 2328  . lurasidone (LATUDA) tablet 40 mg  40 mg Oral Q breakfast Jomarie LongsEappen, Saramma, MD   40 mg at 07/23/16 0849  . lurasidone (LATUDA) tablet 40 mg  40 mg Oral Q supper Jomarie LongsEappen, Saramma, MD   40 mg at 07/22/16 1720  . magnesium hydroxide (MILK OF MAGNESIA) suspension 30 mL  30 mL Oral Daily PRN Eappen, Saramma, MD      . nicotine (NICODERM CQ - dosed in mg/24 hours) patch 21 mg  21 mg Transdermal Daily PRN Fransisca Kaufmannavis, Laura A, NP      . nicotine (NICODERM CQ - dosed in mg/24 hours) patch 21 mg  21 mg Transdermal Daily Cobos, Fernando A, MD      . ondansetron (ZOFRAN) tablet 4 mg  4 mg Oral Q8H PRN Fransisca Kaufmannavis, Laura A, NP      . sertraline (ZOLOFT) tablet 75 mg  75 mg Oral Daily Jomarie LongsEappen, Saramma, MD   75 mg at 07/23/16 0849  . traZODone (DESYREL) tablet 150 mg  150 mg Oral QHS PRN Thermon Leylandavis, Laura A, NP   150 mg at 07/22/16 2328    Lab Results: No results found for this or any previous visit (from the past 48 hour(s)).  Blood Alcohol level:  Lab Results  Component Value Date   ETH <5 07/17/2016   ETH <5 06/06/2016    Metabolic Disorder Labs: Lab Results  Component Value Date   HGBA1C 5.6 03/26/2016   MPG 114 03/26/2016   Lab Results  Component Value Date   PROLACTIN 5.8 03/26/2016   Lab Results  Component Value Date   CHOL 151 03/26/2016   TRIG 149 03/26/2016   HDL 40 (L) 03/26/2016   CHOLHDL 3.8 03/26/2016   VLDL 30 03/26/2016   LDLCALC 81 03/26/2016    Physical Findings: AIMS: Facial and Oral Movements Muscles of Facial Expression: None, normal Lips and Perioral Area: None, normal Jaw: None, normal Tongue: None, normal,Extremity Movements Upper (arms, wrists, hands, fingers): None, normal Lower (legs, knees, ankles, toes): None, normal, Trunk Movements Neck, shoulders, hips: None, normal, Overall Severity Severity of abnormal movements (highest score from  questions above): None, normal Incapacitation due to abnormal movements: None, normal Patient's awareness of abnormal movements (rate only patient's report): No Awareness, Dental Status Current problems with teeth and/or dentures?: No Does patient usually wear dentures?: No  CIWA:  CIWA-Ar Total: 1 COWS:  COWS Total Score: 1  Musculoskeletal: Strength & Muscle Tone: within normal limits Gait & Station: normal Patient leans: N/A  Psychiatric Specialty Exam: Physical Exam  Nursing note and vitals reviewed. Constitutional: She appears well-developed.  HENT:  Head: Normocephalic.  Eyes: Pupils are equal, round, and reactive to light.  Neck: Normal range of  motion.  Cardiovascular: Normal rate.   Respiratory: Effort normal.  GI: Soft.  Genitourinary:  Genitourinary Comments: Diferred  Musculoskeletal: Normal range of motion.  Neurological: She is alert.  Skin: Skin is warm.    Review of Systems  Constitutional: Negative.   HENT: Negative.   Eyes: Negative.   Respiratory: Negative.   Cardiovascular: Negative.   Gastrointestinal: Negative.   Genitourinary: Negative.   Musculoskeletal: Negative.   Skin: Negative.   Neurological: Negative.   Endo/Heme/Allergies: Negative.   Psychiatric/Behavioral: Positive for depression ("Improving") and substance abuse (Hx. Cocaine). Negative for hallucinations, memory loss and suicidal ideas. The patient is nervous/anxious and has insomnia ("Improving").   All other systems reviewed and are negative.   Blood pressure 120/74, pulse (!) 102, temperature 97.3 F (36.3 C), temperature source Oral, resp. rate 18, height 5\' 6"  (1.676 m), weight 98 kg (216 lb), last menstrual period 07/13/2016, SpO2 100 %.Body mass index is 34.86 kg/m.  General Appearance: Casual  Eye Contact:  Fair  Speech:  Normal Rate  Volume:  Normal  Mood:  Anxious and Depressed  Affect:  Appropriate  Thought Process:  Goal Directed and Descriptions of Associations:  Circumstantial  Orientation:  Full (Time, Place, and Person)  Thought Content:  Paranoid Ideation and Rumination  Suicidal Thoughts:  No  Homicidal Thoughts:  No  Memory:  Immediate;   Fair Recent;   Fair Remote;   Fair  Judgement:  Impaired  Insight:  Fair  Psychomotor Activity:  Restlessness  Concentration:  Concentration: Fair and Attention Span: Fair  Recall:  [employer] of Knowledge:  Fair  Language:  Fair  Akathisia:  No  Handed:  Right  AIMS (if indicated):     Assets:  Communication Skills Desire for Improvement  ADL's:  Intact  Cognition:  WNL  Sleep:  Number of Hours: 6.75    MDD (major depressive disorder), recurrent, severe, with psychosis (HCC) unstable   Will continue today 07/23/16 plan as below except where it is noted.  Treatment Plan Summary: Patient is 86 y old , has a hx of depression, ptsd, cocaine use disorder , who presented with worsening anxiety, depression and SI. Pt is making some improvement , however continues to need medication readjustments for anxiety issues and paranoia. Will continue treatment. Daily contact with patient to assess and evaluate symptoms and progress in treatment, Medication management and Plan see below  Will continue Latuda 40 mg po bid with meals for psychosis/mood sx. Will continue Zoloft 75 mg po daily for affective sx. Will continue Trazodone 150 mg po qhs prn for insomnia. Will continue Saphris 5 mg sl bid prn for anxiety sx/agitation. EKG reviewed - qtc wnl. CSW will continue to work on disposition.  Sanjuana Kava, NP, PMHNP, FNP-BC 07/23/2016, 3:08 PMPatient ID: [name], female   DOB: Mar 04, 1976, 41 y.o.   MRN: [id number]

## 2016-07-24 MED ORDER — QUETIAPINE FUMARATE ER 50 MG PO TB24
50.0000 mg | ORAL_TABLET | Freq: Two times a day (BID) | ORAL | Status: DC | PRN
  Administered 2016-07-24 – 2016-07-26 (×5): 50 mg via ORAL
  Filled 2016-07-24 (×5): qty 1

## 2016-07-24 NOTE — Progress Notes (Signed)
Adult Psychoeducational Group Note  Date:  07/24/2016 Time:  4:24 AM3  Group Topic/Focus:  Wrap-Up Group:   The focus of this group is to help patients review their daily goal of treatment and discuss progress on daily workbooks.  Participation Level:  Did Not Attend  Participation Quality:  Did not attend  Affect:  Did not attend  Cognitive:  Did not attend  Insight: None  Engagement in Group:  Did not attend  Modes of Intervention:  Did not attend  Additional Comments:  Patient did not attend wrap up group this evening.  Amy FurnaceChristopher  Corrisa Gibby 07/24/2016, 4:24 AM

## 2016-07-24 NOTE — Progress Notes (Signed)
[facility]  07/24/2016 4:46 PM Amy Le  MRN:  [id number]  Subjective:  Patient reports " I need something to help with this anxiety, states the vistaril is not helping and I took the Saphris 2 day ago and I felt like  I was going to pass out."   Objective: Amy Le is awake, alert and oriented *3. Patient reports suicidal ideation without plan or intent. Denies  homicidal ideation. Denies auditory or visual hallucination and does not appear to be responding to internal stimuli. Patient reports her anxiety is 8/10 and need something strong for anxiety medications. Reports she doesnt have time to try her coping skills. "they don't help anyway." patient reports some irritability, states is mostly anxiety.  Patient reports she is medication compliant without mediation side effects.  Report learning new coping skills, however is not interested in trying with sever anxity. Reports good appetite and reports a difficult time resting throughout the night. Patient report she is excited regarding discharge on Monday. Support, encouragement and reassurance was provided.   Principal Problem: MDD (major depressive disorder), recurrent, severe, with psychosis (HCC)  Diagnosis:   Patient Active Problem List   Diagnosis Date Noted  . Cocaine use disorder, severe, dependence (HCC) [F14.20] 07/20/2016  . MDD (major depressive disorder), recurrent, severe, with psychosis (HCC) [F33.3] 07/20/2016  . Cannabis use disorder, moderate, dependence (HCC) [F12.20] 07/20/2016  . Severe episode of recurrent major depressive disorder, without psychotic features (HCC) [F33.2]   . PTSD (post-traumatic stress disorder) [F43.10] 03/25/2016  . 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  Past Medical History:  Past Medical History:  Diagnosis Date   . Bipolar 1 disorder (HCC)   . BV (bacterial vaginosis)   . Chlamydia   . Trichomonas     Past Surgical History:  Procedure Laterality Date  . FOOT SURGERY    . MULTIPLE TOOTH EXTRACTIONS Bilateral 2017  . TUBAL LIGATION     Family History:  Family History  Problem Relation Age of Onset  . Mental illness Neg Hx    Family Psychiatric  History: Please see H&P.  Social History:  History  Alcohol Use No     History  Drug Use  . Types: "Crack" cocaine, Cocaine, Marijuana    Comment: 60 dollars per day    Social History   Social History  . Marital status: Single    Spouse name: N/A  . Number of children: N/A  . Years of education: N/A   Social History Main Topics  . Smoking status: Current Every Day Smoker    Packs/day: 0.50    Types: Cigarettes  . Smokeless tobacco: Never Used     Comment: Refused  . Alcohol use No  . Drug use: Yes    Types: "Crack" cocaine, Cocaine, Marijuana     Comment: 60 dollars per day  . Sexual activity: Yes    Birth control/ protection: Surgical   Other Topics Concern  . None   Social History Narrative  . None   Additional Social History:    Pain Medications: pt denies Prescriptions: pt denies Over the Counter: pt denies History of alcohol / drug use?: Yes Longest period of sobriety (when/how long): unknown Negative Consequences of Use: Financial Withdrawal Symptoms: Agitation, Irritability, Nausea / Vomiting, Sweats Name of Substance 1: crack cocaine 1 - Age of First Use: 22 1 - Amount (size/oz):  ($  60) 1 - Frequency: daily 1 - Duration: 3 months 1 - Last Use / Amount: 5/4, $100-150 Name of Substance 2: Alcohol (denied in admission) 2 - Age of First Use: 15 2 - Amount (size/oz): 3 drinks 2 - Frequency: 1x week 2 - Duration: ongoing 2 - Last Use / Amount: 5/2  Sleep: Good  Appetite:  Fair  Current Medications: Current Facility-Administered Medications  Medication Dose Route Frequency Provider Last Rate Last Dose   . acetaminophen (TYLENOL) tablet 650 mg  650 mg Oral Q4H PRN Fransisca Kaufmann A, NP   650 mg at 07/21/16 1412  . alum & mag hydroxide-simeth (MAALOX/MYLANTA) 200-200-20 MG/5ML suspension 30 mL  30 mL Oral PRN Fransisca Kaufmann A, NP      . hydrOXYzine (ATARAX/VISTARIL) tablet 50 mg  50 mg Oral Q6H PRN Thermon Leyland, NP   50 mg at 07/24/16 1129  . ibuprofen (ADVIL,MOTRIN) tablet 600 mg  600 mg Oral Q8H PRN Fransisca Kaufmann A, NP   600 mg at 07/22/16 2328  . lurasidone (LATUDA) tablet 40 mg  40 mg Oral Q breakfast Jomarie Longs, MD   40 mg at 07/24/16 0949  . lurasidone (LATUDA) tablet 40 mg  40 mg Oral Q supper Jomarie Longs, MD   40 mg at 07/24/16 1634  . magnesium hydroxide (MILK OF MAGNESIA) suspension 30 mL  30 mL Oral Daily PRN Eappen, Saramma, MD      . nicotine (NICODERM CQ - dosed in mg/24 hours) patch 21 mg  21 mg Transdermal Daily PRN Fransisca Kaufmann A, NP      . nicotine (NICODERM CQ - dosed in mg/24 hours) patch 21 mg  21 mg Transdermal Daily Cobos, Rockey Situ, MD      . ondansetron (ZOFRAN) tablet 4 mg  4 mg Oral Q8H PRN Fransisca Kaufmann A, NP      . QUEtiapine (SEROQUEL XR) 24 hr tablet 50 mg  50 mg Oral BID PRN Oneta Rack, NP      . sertraline (ZOLOFT) tablet 75 mg  75 mg Oral Daily Jomarie Longs, MD   75 mg at 07/24/16 0948  . traZODone (DESYREL) tablet 150 mg  150 mg Oral QHS PRN Thermon Leyland, NP   150 mg at 07/23/16 2304    Lab Results: No results found for this or any previous visit (from the past 48 hour(s)).  Blood Alcohol level:  Lab Results  Component Value Date   ETH <5 07/17/2016   ETH <5 06/06/2016    Metabolic Disorder Labs: Lab Results  Component Value Date   HGBA1C 5.6 03/26/2016   MPG 114 03/26/2016   Lab Results  Component Value Date   PROLACTIN 5.8 03/26/2016   Lab Results  Component Value Date   CHOL 151 03/26/2016   TRIG 149 03/26/2016   HDL 40 (L) 03/26/2016   CHOLHDL 3.8 03/26/2016   VLDL 30 03/26/2016   LDLCALC 81 03/26/2016    Physical  Findings: AIMS: Facial and Oral Movements Muscles of Facial Expression: None, normal Lips and Perioral Area: None, normal Jaw: None, normal Tongue: None, normal,Extremity Movements Upper (arms, wrists, hands, fingers): None, normal Lower (legs, knees, ankles, toes): None, normal, Trunk Movements Neck, shoulders, hips: None, normal, Overall Severity Severity of abnormal movements (highest score from questions above): None, normal Incapacitation due to abnormal movements: None, normal Patient's awareness of abnormal movements (rate only patient's report): No Awareness, Dental Status Current problems with teeth and/or dentures?: No Does patient usually wear dentures?: No  CIWA:  CIWA-Ar Total: 1 COWS:  COWS Total Score: 1  Musculoskeletal: Strength & Muscle Tone: within normal limits Gait & Station: normal Patient leans: N/A  Psychiatric Specialty Exam: Physical Exam  Nursing note and vitals reviewed. Constitutional: She appears well-developed.  HENT:  Head: Normocephalic.  Eyes: Pupils are equal, round, and reactive to light.  Neck: Normal range of motion.  Cardiovascular: Normal rate.   Respiratory: Effort normal.  GI: Soft.  Genitourinary:  Genitourinary Comments: Diferred  Musculoskeletal: Normal range of motion.  Neurological: She is alert.  Skin: Skin is warm.  Psychiatric: She has a normal mood and affect. Her behavior is normal.    Review of Systems  Psychiatric/Behavioral: Positive for depression, substance abuse and suicidal ideas. Negative for hallucinations and memory loss. The patient is nervous/anxious and has insomnia ("Improving").     Blood pressure (!) 132/54, pulse 90, temperature 98.6 F (37 C), temperature source Oral, resp. rate 16, height 5\' 6"  (1.676 m), weight 98 kg (216 lb), last menstrual period 07/13/2016, SpO2 100 %.Body mass index is 34.86 kg/m.  General Appearance: Casual anxious   Eye Contact:  Fair  Speech:  Normal Rate  Volume:  Normal   Mood:  Anxious and Depressed  Affect:  Appropriate  Thought Process:  Goal Directed and Descriptions of Associations: Circumstantial  Orientation:  Full (Time, Place, and Person)  Thought Content:  Paranoid Ideation and Rumination  Suicidal Thoughts:  No  Homicidal Thoughts:  No  Memory:  Immediate;   Fair Recent;   Fair Remote;   Fair  Judgement:  Impaired  Insight:  Fair  Psychomotor Activity:  Restlessness  Concentration:  Concentration: Fair and Attention Span: Fair  Recall:  [employer] of Knowledge:  Fair  Language:  Fair  Akathisia:  No  Handed:  Right  AIMS (if indicated):     Assets:  Communication Skills Desire for Improvement  ADL's:  Intact  Cognition:  WNL  Sleep:  Number of Hours: 5.5     I agree with current treatment plan on 07/24/2016, Patient seen face-to-face for psychiatric evaluation follow-up, chart reviewed. Reviewed the information documented and agree with the treatment plan. MDD (major depressive disorder), recurrent, severe, with psychosis (HCC) improving   Will continue today 07/24/16 plan as below except where it is noted.  Treatment Plan Summary: Daily contact with patient to assess and evaluate symptoms and progress in treatment, Medication management and Plan see below  Will continue Latuda 40 mg po bid with meals for psychosis/mood sx. Will continue Zoloft 75 mg po daily for affective sx. Will continue Trazodone 150 mg po qhs prn for insomnia. Will discontinue Saphris 5 mg sl bid prn for anxiety sx/agitation. And Start Seroquel 50 mg PO BID PRN for anxiety/agitation  EKG reviewed - qtc wnl. CSW will continue to work on disposition.  Oneta Rack, NP 07/24/2016, 4:46 PM

## 2016-07-24 NOTE — Progress Notes (Signed)
D: Patient's self inventory sheet: patient has good sleep, recieved sleep medication.good  Appetite, high energy level, good concentration. Rated depression 6/10, hopeless 7/10, anxiety 10/10. SI/HI/AVH: Denies all. Physical complaints are withdrawal related - tremors, cramping, agiation, irritability, pain. Goal is "preparing for miracle of hope". Plans to work on "pray".   A: Medications administered, assessed medication knowledge and education given on medication regimen.  Emotional support and encouragement given patient. R: Denies SI and HI , contracts for safety. Safety maintained with 15 minute checks.

## 2016-07-24 NOTE — Progress Notes (Signed)
Psychoeducational Group Note  Date:  07/24/2016 Time:  2030  Group Topic/Focus:  wrap up group  Participation Level: Did Not Attend  Participation Quality:  Not Applicable  Affect:  Not Applicable  Cognitive:  Not Applicable  Insight:  Not Applicable  Engagement in Group: Not Applicable  Additional Comments:  Pt was notified that group was beginning but remained in bed.   Marcille BuffyMcNeil, Uzair Godley S 07/24/2016, 9:24 PM

## 2016-07-25 MED ORDER — LURASIDONE HCL 40 MG PO TABS: 40.0000 mg | ORAL_TABLET | Freq: Every day | ORAL | Status: DC

## 2016-07-25 NOTE — Progress Notes (Signed)
[facility] MD Progress Note  07/25/2016 11:58 AM Amy Le  MRN:  [id number]  Subjective: Amy Le reports, "I will need the Seroquel dose increased because it is helping me with the anxiety. The Saphris did me bad, it made me sick like I was gonna pass out. I would like to stay on the Seroquel".  Objective: Amy Le seen, chart reviewed. She is awake, alert and oriented x 3. Patient reports passive suicidal ideation without plan or intent. Denies homicidal ideation. Denies auditory or visual hallucination and does not appear to be responding to internal stimuli. Patient reports her anxiety is 7/10 and need something strong for anxiety medications. Says Seroquel helps. Patient reports she is medication compliant without mediation side effects. Report learning new coping skills, however is not interested in trying with sever anxity. Reports good appetite and reports a difficult time resting throughout the night. Patient report she is excited regarding discharge on Monday. She is attending group sessions. Support, encouragement and reassurance was provided.   Principal Problem: MDD (major depressive disorder), recurrent, severe, with psychosis (HCC)  Diagnosis:   Patient Active Problem List   Diagnosis Date Noted  . Cocaine use disorder, severe, dependence (HCC) [F14.20] 07/20/2016  . MDD (major depressive disorder), recurrent, severe, with psychosis (HCC) [F33.3] 07/20/2016  . Cannabis use disorder, moderate, dependence (HCC) [F12.20] 07/20/2016  . Severe episode of recurrent major depressive disorder, without psychotic features (HCC) [F33.2]   . PTSD (post-traumatic stress disorder) [F43.10] 03/25/2016  . 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  Past Medical History:  Past Medical History:  Diagnosis Date  . Bipolar 1 disorder (HCC)   .  BV (bacterial vaginosis)   . Chlamydia   . Trichomonas     Past Surgical History:  Procedure Laterality Date  . FOOT SURGERY    . MULTIPLE TOOTH EXTRACTIONS Bilateral 2017  . TUBAL LIGATION     Family History:  Family History  Problem Relation Age of Onset  . Mental illness Neg Hx    Family Psychiatric  History: Please see H&P.  Social History:  History  Alcohol Use No     History  Drug Use  . Types: "Crack" cocaine, Cocaine, Marijuana    Comment: 60 dollars per day    Social History   Social History  . Marital status: Single    Spouse name: N/A  . Number of children: N/A  . Years of education: N/A   Social History Main Topics  . Smoking status: Current Every Day Smoker    Packs/day: 0.50    Types: Cigarettes  . Smokeless tobacco: Never Used     Comment: Refused  . Alcohol use No  . Drug use: Yes    Types: "Crack" cocaine, Cocaine, Marijuana     Comment: 60 dollars per day  . Sexual activity: Yes    Birth control/ protection: Surgical   Other Topics Concern  . None   Social History Narrative  . None   Additional Social History:    Pain Medications: pt denies Prescriptions: pt denies Over the Counter: pt denies History of alcohol / drug use?: Yes Longest period of sobriety (when/how long): unknown Negative Consequences of Use: Financial Withdrawal Symptoms: Agitation, Irritability, Nausea / Vomiting, Sweats Name of Substance 1: crack cocaine 1 - Age of First Use: 22 1 - Amount (size/oz):  ($60) 1 - Frequency: daily 1 - Duration: 3  months 1 - Last Use / Amount: 5/4, $100-150 Name of Substance 2: Alcohol (denied in admission) 2 - Age of First Use: 15 2 - Amount (size/oz): 3 drinks 2 - Frequency: 1x week 2 - Duration: ongoing 2 - Last Use / Amount: 5/2  Sleep: Good  Appetite:  Fair  Current Medications: Current Facility-Administered Medications  Medication Dose Route Frequency Provider Last Rate Last Dose  . acetaminophen (TYLENOL) tablet  650 mg  650 mg Oral Q4H PRN Fransisca Kaufmann A, NP   650 mg at 07/21/16 1412  . alum & mag hydroxide-simeth (MAALOX/MYLANTA) 200-200-20 MG/5ML suspension 30 mL  30 mL Oral PRN Fransisca Kaufmann A, NP      . hydrOXYzine (ATARAX/VISTARIL) tablet 50 mg  50 mg Oral Q6H PRN Thermon Leyland, NP   50 mg at 07/24/16 2203  . ibuprofen (ADVIL,MOTRIN) tablet 600 mg  600 mg Oral Q8H PRN Fransisca Kaufmann A, NP   600 mg at 07/22/16 2328  . lurasidone (LATUDA) tablet 40 mg  40 mg Oral Q breakfast Jomarie Longs, MD   40 mg at 07/25/16 0805  . lurasidone (LATUDA) tablet 40 mg  40 mg Oral Q supper Jomarie Longs, MD   40 mg at 07/24/16 1634  . magnesium hydroxide (MILK OF MAGNESIA) suspension 30 mL  30 mL Oral Daily PRN Eappen, Saramma, MD      . nicotine (NICODERM CQ - dosed in mg/24 hours) patch 21 mg  21 mg Transdermal Daily PRN Fransisca Kaufmann A, NP      . nicotine (NICODERM CQ - dosed in mg/24 hours) patch 21 mg  21 mg Transdermal Daily Cobos, Rockey Situ, MD      . ondansetron (ZOFRAN) tablet 4 mg  4 mg Oral Q8H PRN Fransisca Kaufmann A, NP      . QUEtiapine (SEROQUEL XR) 24 hr tablet 50 mg  50 mg Oral BID PRN Oneta Rack, NP   50 mg at 07/25/16 0807  . sertraline (ZOLOFT) tablet 75 mg  75 mg Oral Daily Eappen, Levin Bacon, MD   75 mg at 07/25/16 0805  . traZODone (DESYREL) tablet 150 mg  150 mg Oral QHS PRN Thermon Leyland, NP   150 mg at 07/24/16 2203   Lab Results: No results found for this or any previous visit (from the past 48 hour(s)).  Blood Alcohol level:  Lab Results  Component Value Date   ETH <5 07/17/2016   ETH <5 06/06/2016   Metabolic Disorder Labs: Lab Results  Component Value Date   HGBA1C 5.6 03/26/2016   MPG 114 03/26/2016   Lab Results  Component Value Date   PROLACTIN 5.8 03/26/2016   Lab Results  Component Value Date   CHOL 151 03/26/2016   TRIG 149 03/26/2016   HDL 40 (L) 03/26/2016   CHOLHDL 3.8 03/26/2016   VLDL 30 03/26/2016   LDLCALC 81 03/26/2016    Physical Findings: AIMS:  Facial and Oral Movements Muscles of Facial Expression: None, normal Lips and Perioral Area: None, normal Jaw: None, normal Tongue: None, normal,Extremity Movements Upper (arms, wrists, hands, fingers): None, normal Lower (legs, knees, ankles, toes): None, normal, Trunk Movements Neck, shoulders, hips: None, normal, Overall Severity Severity of abnormal movements (highest score from questions above): None, normal Incapacitation due to abnormal movements: None, normal Patient's awareness of abnormal movements (rate only patient's report): No Awareness, Dental Status Current problems with teeth and/or dentures?: No Does patient usually wear dentures?: No  CIWA:  CIWA-Ar Total: 1 COWS:  COWS Total Score: 1  Musculoskeletal: Strength & Muscle Tone: within normal limits Gait & Station: normal Patient leans: N/A  Psychiatric Specialty Exam: Physical Exam  Nursing note and vitals reviewed. Constitutional: She appears well-developed.  HENT:  Head: Normocephalic.  Eyes: Pupils are equal, round, and reactive to light.  Neck: Normal range of motion.  Cardiovascular: Normal rate.   Respiratory: Effort normal.  GI: Soft.  Genitourinary:  Genitourinary Comments: Deferred  Musculoskeletal: Normal range of motion.  Neurological: She is alert.  Skin: Skin is warm.  Psychiatric: She has a normal mood and affect. Her behavior is normal.    Review of Systems  Psychiatric/Behavioral: Positive for depression, substance abuse and suicidal ideas. Negative for hallucinations and memory loss. The patient is nervous/anxious and has insomnia ("Improving").     Blood pressure 109/77, pulse 96, temperature 97.8 F (36.6 C), resp. rate 16, height 5\' 6"  (1.676 m), weight 98 kg (216 lb), last menstrual period 07/13/2016, SpO2 100 %.Body mass index is 34.86 kg/m.  General Appearance: Casual anxious   Eye Contact:  Fair  Speech:  Normal Rate  Volume:  Normal  Mood:  Anxious and Depressed  Affect:   Appropriate  Thought Process:  Goal Directed and Descriptions of Associations: Circumstantial  Orientation:  Full (Time, Place, and Person)  Thought Content:  Paranoid Ideation and Rumination  Suicidal Thoughts:  No  Homicidal Thoughts:  No  Memory:  Immediate;   Fair Recent;   Fair Remote;   Fair  Judgement:  Impaired  Insight:  Fair  Psychomotor Activity:  Restlessness  Concentration:  Concentration: Fair and Attention Span: Fair  Recall:  [employer]Fair  Fund of Knowledge:  Fair  Language:  Fair  Akathisia:  No  Handed:  Right  AIMS (if indicated):     Assets:  Communication Skills Desire for Improvement  ADL's:  Intact  Cognition:  WNL  Sleep:  Number of Hours: 6.25   MDD (major depressive disorder), recurrent, severe, with psychosis (HCC) improving   Will continue today 07/25/16 plan as below except where it is noted.  Treatment Plan Summary: Daily contact with patient to assess and evaluate symptoms and progress in treatment, Medication management and Plan see below  Will continue Latuda 40 mg po bid with meals for psychosis/mood sx. Will continue Zoloft 75 mg po daily for affective sx. Will continue Trazodone 150 mg po qhs prn for insomnia. Will Seroquel 50 mg PO BID PRN for anxiety/agitation  EKG reviewed - qtc wnl. CSW will continue to work on disposition.  Sanjuana KavaNwoko, Jerusalen Mateja I, NP, PMHNP, FNP-BC. 07/25/2016, 11:58 AMPatient ID: Amy RankinJacquetta Le, female   DOB: 1975-11-20, 41 y.o.   MRN: [id number]

## 2016-07-25 NOTE — Progress Notes (Signed)
D: Pt at the time of assessment was isolative and withdrawn to room: remained in bed all evening. Pt at the time denied pain, anxiety, depression, SI, HI or AVH; states, "you just woke me up; I have not been awake to be feeling. Pt remained calm and cooperative. A: Medications offered as prescribed. All patient's questions and concerns addressed. Support, encouragement, and safe environment provided. Will continue to monitor for any changes. 15-minute safety checks continue. R: Pt was med compliant. Pt did not attend wrap-up group. Safety checks continue.

## 2016-07-25 NOTE — [facility] Group Notes (Signed)
[facility] LCSW Group Therapy  07/25/2016  11 AM  Type of Therapy:  Group Therapy  Participation Level:  Did Not Attend; invited to participate yet did not despite overhead announcement and encouragement by staff   Summary of Progress/Problems: Topic for today was thoughts and feelings regarding discharge. We discussed fears of upcoming changes including judgements, expectations and stigma of mental health issues. We then discussed supports: what constitutes a supportive framework, identification of supports and what to do when others are not supportive. Patients processed their greatest challenges  Klayten Jolliff C Arriyah Madej, LCSW    

## 2016-07-25 NOTE — Progress Notes (Signed)
Psychoeducational Group Note  Date:  07/25/2016 Time:  2030  Group Topic/Focus:  wrap up group  Participation Level: Did Not Attend  Participation Quality:  Not Applicable  Affect:  Not Applicable  Cognitive:  Not Applicable  Insight:  Not Applicable  Engagement in Group: Not Applicable  Additional Comments:  Pt notified that group was beginning but remained in bed. Pt also invited to attend AA meeting to encourage going to group but stayed in bed.   Marcille BuffyMcNeil, Kittie Krizan S 07/25/2016, 8:38 PM

## 2016-07-25 NOTE — [facility] Counselor (Signed)
CSW informed by pt's RN that she was to be [facility] at [employer]9[facility] [place]. CSW spoke to patient and called [facility] at [phone] and spoke to pastor who reports they would have someone leave to pick up pt at 9am on 07/26/2016. Unclear to writer if this provider is providing treatment or transportation only or both. Request left for weekday CSW to [facility] before 9AM on Monday 5/14.  Carney Bernatherine C Harrill, LCSW

## 2016-07-26 ENCOUNTER — Encounter (HOSPITAL_COMMUNITY): Payer: Self-pay | Admitting: Psychiatry

## 2016-07-26 MED ORDER — TRAZODONE HCL 150 MG PO TABS: 150.0000 mg | ORAL_TABLET | Freq: Every evening | ORAL | 0 refills | Status: DC | PRN

## 2016-07-26 MED ORDER — SERTRALINE HCL 25 MG PO TABS: 75.0000 mg | ORAL_TABLET | Freq: Every day | ORAL | 0 refills | Status: DC

## 2016-07-26 MED ORDER — HYDROXYZINE HCL 50 MG PO TABS: 50.0000 mg | ORAL_TABLET | Freq: Four times a day (QID) | ORAL | 0 refills | Status: DC | PRN

## 2016-07-26 MED ORDER — LURASIDONE HCL 40 MG PO TABS: 40.0000 mg | ORAL_TABLET | Freq: Every day | ORAL | 0 refills | Status: DC

## 2016-07-26 NOTE — Discharge Summary (Signed)
Physician Discharge Summary Note  Patient:  Amy Le is an 41 y.o., female MRN:  [id number] DOB:  [birth year] Patient phone:  [phone] (home)  Patient address:   [address] [place] [address],  Total Time spent with patient: 30 minutes  Date of Admission:  07/20/2016 Date of Discharge: 07/26/2016  Reason for Admission: Per H&P-  This is one of several admission assessments for this 41 year old AA female with hx of mental illness & Cocaine use disorder. She was discharged from this hospital on 04-09-16 with prescriptions for her medications & a follow-up appointment to Amy Le for further mental health care, Amy Le reports that she did not follow-up care as recommended & has been off of her mental health medications x 2 months. She says she resumed drug use shortly after discharge. During this assessment, Amy Le reports, "I was feeling suicidal. It started a couple of days ago. I brought it upon myself. It is my fault. I was off of my medications x over a month. Since I stopped taking my medicines, I have been here & there, going crazy mentally. I started feeling hopeless, then became suicidal. I would like to get back on my mental health medicines & a referral to a substance abuse treatment Le after discharge".   Principal Problem: MDD (major depressive disorder), recurrent, severe, with psychosis [facility]) Discharge Diagnoses: Patient Active Problem List   Diagnosis Date Noted  . Cocaine use disorder, severe, dependence (HCC) [F14.20] 07/20/2016  . MDD (major depressive disorder), recurrent, severe, with psychosis (HCC) [F33.3] 07/20/2016  . Cannabis use disorder, moderate, dependence (HCC) [F12.20] 07/20/2016  . Severe episode of recurrent major depressive disorder, without psychotic features (HCC) [F33.2]   . PTSD (post-traumatic stress disorder) [F43.10] 03/25/2016  . 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 Bipolar 1 disorder (HCC)   . BV (bacterial vaginosis)   . Chlamydia   . Trichomonas     Past Surgical History:  Procedure Laterality Date  . FOOT SURGERY    . MULTIPLE TOOTH EXTRACTIONS Bilateral 2017  . TUBAL LIGATION     Family History:  Family History  Problem Relation Age of Onset  . Mental illness Neg Hx    Family Psychiatric  History:  Social History:  History  Alcohol Use No     History  Drug Use  . Types: "Crack" cocaine, Cocaine, Marijuana    Comment: 60 dollars per day    Social History   Social History  . Marital status: Single    Spouse name: N/A  . Number of children: N/A  . Years of education: N/A   Social History Main Topics  . Smoking status: Current Every Day Smoker    Packs/day: 0.50    Types: Cigarettes  . Smokeless tobacco: Never Used     Comment: Refused  . Alcohol use No  . Drug use: Yes    Types: "Crack" cocaine, Cocaine, Marijuana     Comment: 60 dollars per day  . Sexual activity: Yes    Birth control/ protection: Surgical   Other Topics Concern  . None   Social History Narrative  . None    Hospital Course: Amy Le was admitted for MDD (major depressive disorder), recurrent, severe, with psychosis (HCC)  and crisis management.  Pt was treated discharged with the medications listed below under Medication List.  Medical problems were identified and treated as needed.  Home medications were restarted as appropriate.  Improvement was monitored by observation and Amy Le 's daily report of symptom reduction.  Emotional and mental status was monitored by daily self-inventory reports completed by Amy Le and clinical staff.         Amy Le was evaluated by the treatment team for stability and plans for continued recovery upon discharge. Amy Le 's motivation was an integral  factor for scheduling further treatment. Employment, transportation, bed availability, health status, family support, and any pending legal issues were also considered during hospital stay. Pt was offered further treatment options upon discharge including but not limited to Residential, Intensive Outpatient, and Outpatient treatment.  Amy Le will follow up with the services as listed below under Follow Up Information.     Upon completion of this admission the patient was both mentally and medically stable for discharge denying suicidal/homicidal ideation, auditory/visual/tactile hallucinations, delusional thoughts and paranoia.    Amy Le responded well to treatment with Latuda 40 mg, Zoloft 75 mg  and Trazodone 150 mg without adverse effects.  Pt demonstrated improvement without reported or observed adverse effects to the point of stability appropriate for outpatient management. Pertinent labs include:for which outpatient follow-up is necessary for lab recheck as mentioned below. Reviewed CBC, CMP, BAL, and UDS; all unremarkable aside from noted exceptions.   Physical Findings: AIMS: Facial and Oral Movements Muscles of Facial Expression: None, normal Lips and Perioral Area: None, normal Jaw: None, normal Tongue: None, normal,Extremity Movements Upper (arms, wrists, hands, fingers): None, normal Lower (legs, knees, ankles, toes): None, normal, Trunk Movements Neck, shoulders, hips: None, normal, Overall Severity Severity of abnormal movements (highest score from questions above): None, normal Incapacitation due to abnormal movements: None, normal Patient's awareness of abnormal movements (rate only patient's report): No Awareness, Dental Status Current problems with teeth and/or dentures?: No Does patient usually wear dentures?: No  CIWA:  CIWA-Ar Total: 1 COWS:  COWS Total Score: 1  Musculoskeletal: Strength & Muscle Tone: within normal limits Gait & Station:  normal Patient leans: N/A  Psychiatric Specialty Exam: See SRA by MD Physical Exam  Nursing note and vitals reviewed. Constitutional: She appears well-developed and well-nourished.  Cardiovascular: Normal rate.   Psychiatric: She has a normal mood and affect. Her behavior is normal.    Review of Systems  Psychiatric/Behavioral: Negative for depression (stable ). Substance abuse: ongoing progress.. stable. The patient is not nervous/anxious (stable).     Blood pressure (!) 141/78, pulse (!) 129, temperature 98.7 F (37.1 C), temperature source Oral, resp. rate 16, height 5\' 6"  (1.676 m), weight 98 kg (216 lb), last menstrual period 07/13/2016, SpO2 100 %.Body mass index is 34.86 kg/m.    Have you used any form of tobacco in the last 30 days? (Cigarettes, Smokeless Tobacco, Cigars, and/or Pipes): Yes  Has this patient used any form of tobacco in the last 30 days? (Cigarettes, Smokeless Tobacco, Cigars, and/or Pipes) Yes, No  Blood Alcohol level:  Lab Results  Component Value Date   [facility] Le <5 07/17/2016   ETH <5 06/06/2016    Metabolic Disorder Labs:  Lab Results  Component Value Date   HGBA1C 5.6 03/26/2016   MPG 114 03/26/2016   Lab Results  Component Value Date   PROLACTIN 5.8 03/26/2016   Lab Results  Component Value Date   CHOL 151 03/26/2016   TRIG 149 03/26/2016   HDL 40 (L) 03/26/2016   CHOLHDL 3.8 03/26/2016  VLDL 30 03/26/2016   LDLCALC 81 03/26/2016    See Psychiatric Specialty Exam and Suicide Risk Assessment completed by Attending Physician prior to discharge.  Discharge destination:  Home  Is patient on multiple antipsychotic therapies at discharge:  No   Has Patient had three or more failed trials of antipsychotic monotherapy by history:  No  Recommended Plan for Multiple Antipsychotic Therapies: NA  Discharge Instructions    Diet - low sodium heart healthy    Complete by:  As directed    Discharge instructions    Complete by:  As directed     Take all medications as prescribed. Keep all follow-up appointments as scheduled.  Do not consume alcohol or use illegal drugs while on prescription medications. Report any adverse effects from your medications to your primary care provider promptly.  In the event of recurrent symptoms or worsening symptoms, call 911, a crisis hotline, or go to the nearest emergency department for evaluation.   Increase activity slowly    Complete by:  As directed      Allergies as of 07/26/2016   No Known Allergies     Medication List    STOP taking these medications   hydrOXYzine 50 MG tablet Commonly known as:  ATARAX/VISTARIL     TAKE these medications     Indication  lurasidone 40 MG Tabs tablet Commonly known as:  LATUDA Take 1 tablet (40 mg total) by mouth daily with supper. What changed:  when to take this  Indication:  Depressive Phase of Manic-Depression   sertraline 25 MG tablet Commonly known as:  ZOLOFT Take 3 tablets (75 mg total) by mouth daily. Start taking on:  07/27/2016 What changed:  medication strength  how much to take  Indication:  Major Depressive Disorder   traZODone 150 MG tablet Commonly known as:  DESYREL Take 1 tablet (150 mg total) by mouth at bedtime as needed for sleep.  Indication:  Trouble Sleeping      Follow-up Information    [facility] Follow up.   Why:  Go to the walk-in clinic Tues or Thurs between 8:30 and 11 for your hospital follow up appointment Contact information: [facility]  [address]  [place] [place] [facility] [phone]          Follow-up recommendations:  Activity:  as tolerated Diet:  heart healthy  Comments: Take all medications as prescribed. Keep all follow-up appointments as scheduled.  Do not consume alcohol or use illegal drugs while on prescription medications. Report any adverse effects from your medications to your primary care provider promptly.  In the event of recurrent symptoms or worsening symptoms, call 911, a  crisis hotline, or go to the nearest emergency department for evaluation.   Signed: Oneta Rackanika N Shadow Schedler, NP 07/26/2016, 1:34 PM

## 2016-07-26 NOTE — Progress Notes (Signed)
D: Pt at the time of assessment was isolative and withdrawn to room: remained in bed all evening. Pt at the time denied pain, anxiety, depression, SI, HI or AVH; states, "you just woke me up." Pt remained calm and cooperative. A: Medications offered as prescribed. All patient's questions and concerns addressed. Support, encouragement, and safe environment provided. Will continue to monitor for any changes. 15-minute safety checks continue. R: Pt was med compliant. Pt did not attend wrap-up group. Safety checks continue.

## 2016-07-26 NOTE — Plan of Care (Signed)
Problem: [facility] Participation in Recreation Therapeutic Interventions Goal: STG-Patient will identify at least five coping skills for ** STG: Coping Skills - Patient will be able to identify at least 5 coping skills for S/I by conclusion of recreation therapy tx  Outcome: Completed/Met Date Met: 07/26/16 Pt was able to identify coping skills at completion of coping skills recreation therapy session.  Victorino Sparrow, LRT/CTRS

## 2016-07-26 NOTE — Progress Notes (Signed)
Recreation Therapy Notes  Date: 07/26/16 Time: 1000 Location: [address] Dayroom  Group Topic: Coping Skills  Goal Area(s) Addresses:  Patient will be able to identify positive coping skills. Patient will be able to identify benefits of coping skills. Patient will be able to identify benefits of using coping skills post d/c.  Intervention: Coping skills worksheet, magazines, scissors, glue sticks, construction paper  Activity: Coping skills collage.  Patients were to identify coping skills that can be used for diversions, cognitive, social, tension releasers and for physical.   Education: Coping Skills, Discharge Planning.   Education Outcome: Acknowledges understanding/In group clarification offered/Needs additional education.   Clinical Observations/Feedback: Pt did not attend group.   Shelbylynn Walczyk, LRT/CTRS         Padme Arriaga A 07/26/2016 12:03 PM 

## 2016-07-26 NOTE — Social Work (Signed)
Patient declined by TCT as her discharge plan is to relocate for substance abuse treatment in [place] area and [place] not [place] residing in [place].  Santa GeneraAnne Avianah Pellman, LCSW Lead Clinical Social Worker Phone:  [phone]

## 2016-07-26 NOTE — Tx Team (Signed)
Interdisciplinary Treatment and Diagnostic Plan Update  07/26/2016 Time of Session: 8:18 AM  Amy Le MRN: [id number]  Principal Diagnosis: MDD (major depressive disorder), recurrent, severe, with psychosis ([place])  Secondary Diagnoses: Principal Problem:   MDD (major depressive disorder), recurrent, severe, with psychosis ([place]) Active Problems:   Cocaine use disorder, severe, dependence ([place])   Cannabis use disorder, moderate, dependence ([place])   Current Medications:  Current Facility-Administered Medications  Medication Dose Route Frequency Provider Last Rate Last Dose  . acetaminophen (TYLENOL) tablet 650 mg  650 mg Oral Q4H PRN Elmarie Shiley A, NP   650 mg at 07/21/16 1412  . alum & mag hydroxide-simeth (MAALOX/MYLANTA) 200-200-20 MG/5ML suspension 30 mL  30 mL Oral PRN Elmarie Shiley A, NP      . hydrOXYzine (ATARAX/VISTARIL) tablet 50 mg  50 mg Oral Q6H PRN Niel Hummer, NP   50 mg at 07/25/16 2229  . ibuprofen (ADVIL,MOTRIN) tablet 600 mg  600 mg Oral Q8H PRN Elmarie Shiley A, NP   600 mg at 07/26/16 0332  . lurasidone (LATUDA) tablet 40 mg  40 mg Oral Q breakfast Ursula Alert, MD   40 mg at 07/25/16 0805  . lurasidone (LATUDA) tablet 40 mg  40 mg Oral Q supper Ursula Alert, MD   40 mg at 07/25/16 1812  . magnesium hydroxide (MILK OF MAGNESIA) suspension 30 mL  30 mL Oral Daily PRN Eappen, Ria Clock, MD      . ondansetron (ZOFRAN) tablet 4 mg  4 mg Oral Q8H PRN Elmarie Shiley A, NP      . QUEtiapine (SEROQUEL XR) 24 hr tablet 50 mg  50 mg Oral BID PRN Derrill Center, NP   50 mg at 07/25/16 1816  . sertraline (ZOLOFT) tablet 75 mg  75 mg Oral Daily Eappen, Ria Clock, MD   75 mg at 07/25/16 0805  . traZODone (DESYREL) tablet 150 mg  150 mg Oral QHS PRN Niel Hummer, NP   150 mg at 07/25/16 2229    PTA Medications: Prescriptions Prior to Admission  Medication Sig Dispense Refill Last Dose  . hydrOXYzine (ATARAX/VISTARIL) 50 MG tablet Take 1 tablet (50 mg total) by mouth  every 6 (six) hours as needed for anxiety. 30 tablet 0 Past Month at Unknown time  . lurasidone (LATUDA) 40 MG TABS tablet Take 1 tablet (40 mg total) by mouth daily with breakfast. 30 tablet 0 Past Month at Unknown time  . sertraline (ZOLOFT) 50 MG tablet Take 1 tablet (50 mg total) by mouth daily. 30 tablet 0 Past Month at Unknown time  . traZODone (DESYREL) 150 MG tablet Take 1 tablet (150 mg total) by mouth at bedtime as needed for sleep. 30 tablet 0 Past Month at Unknown time    Treatment Modalities: Medication Management, Group therapy, Case management,  1 to 1 session with clinician, Psychoeducation, Recreational therapy.   Physician Treatment Plan for Primary Diagnosis: MDD (major depressive disorder), recurrent, severe, with psychosis ([place]) Long Term Goal(s): Improvement in symptoms so as ready for discharge  Short Term Goals: Ability to identify changes in lifestyle to reduce recurrence of condition will improve Ability to verbalize feelings will improve Ability to disclose and discuss suicidal ideas Ability to demonstrate self-control will improve Ability to identify and develop effective coping behaviors will improve Compliance with prescribed medications will improve Ability to identify triggers associated with substance abuse/mental health issues will improve  Medication Management: Evaluate patient's response, side effects, and tolerance of medication regimen.  Therapeutic Interventions: 1  to 1 sessions, Unit Group sessions and Medication administration.  Evaluation of Outcomes: Adequate for Discharge  Physician Treatment Plan for Secondary Diagnosis: Principal Problem:   MDD (major depressive disorder), recurrent, severe, with psychosis ([place]) Active Problems:   Cocaine use disorder, severe, dependence ([place])   Cannabis use disorder, moderate, dependence ([place])   Long Term Goal(s): Improvement in symptoms so as ready for discharge  Short Term Goals: Ability to  identify changes in lifestyle to reduce recurrence of condition will improve Ability to verbalize feelings will improve Ability to disclose and discuss suicidal ideas Ability to demonstrate self-control will improve Ability to identify and develop effective coping behaviors will improve Compliance with prescribed medications will improve Ability to identify triggers associated with substance abuse/mental health issues will improve  Medication Management: Evaluate patient's response, side effects, and tolerance of medication regimen.  Therapeutic Interventions: 1 to 1 sessions, Unit Group sessions and Medication administration.  Evaluation of Outcomes: Adequate for Discharge   RN Treatment Plan for Primary Diagnosis: MDD (major depressive disorder), recurrent, severe, with psychosis ([place]) Long Term Goal(s): Knowledge of disease and therapeutic regimen to maintain health will improve  Short Term Goals: Ability to identify and develop effective coping behaviors will improve and Compliance with prescribed medications will improve  Medication Management: RN will administer medications as ordered by provider, will assess and evaluate patient's response and provide education to patient for prescribed medication. RN will report any adverse and/or side effects to prescribing provider.  Therapeutic Interventions: 1 on 1 counseling sessions, Psychoeducation, Medication administration, Evaluate responses to treatment, Monitor vital signs and CBGs as ordered, Perform/monitor CIWA, COWS, AIMS and Fall Risk screenings as ordered, Perform wound care treatments as ordered.  Evaluation of Outcomes: Adequate for Discharge   LCSW Treatment Plan for Primary Diagnosis: MDD (major depressive disorder), recurrent, severe, with psychosis ([place]) Long Term Goal(s): Safe transition to appropriate next level of care at discharge, Engage patient in therapeutic group addressing interpersonal concerns.  Short Term  Goals: Engage patient in aftercare planning with referrals and resources  Therapeutic Interventions: Assess for all discharge needs, 1 to 1 time with Social worker, Explore available resources and support systems, Assess for adequacy in community support network, Educate family and significant other(s) on suicide prevention, Complete Psychosocial Assessment, Interpersonal group therapy.  Evaluation of Outcomes: Met   Progress in Treatment: Attending groups: Yes Participating in groups: Minimally Taking medication as prescribed: Yes Toleration medication: Yes, no side effects reported at this time Family/Significant other contact made: No Patient understands diagnosis: Yes AEB asking for help with homelessness Discussing patient identified problems/goals with staff: Yes Medical problems stabilized or resolved: Yes Denies suicidal/homicidal ideation: Yes Issues/concerns per patient self-inventory: None Other: N/A  New problem(s) identified: None identified at this time.   New Short Term/Long Term Goal(s): None identified at this time.   Discharge Plan or Barriers: Going to [facility] in [place], follow up [place]  Reason for Continuation of Hospitalization:  Estimated Length of Stay: D/C today  Attendees: Patient: 07/26/2016  8:18 AM  Physician: Ursula Alert, MD 07/26/2016  8:18 AM  Nursing: Darrol Angel, RN 07/26/2016  8:18 AM  RN Care Manager: Lars Pinks, RN 07/26/2016  8:18 AM  Social Worker: Ripley Fraise 07/26/2016  8:18 AM  Recreational Therapist: Laretta Bolster  07/26/2016  8:18 AM  Other: Norberto Sorenson 07/26/2016  8:18 AM  Other:  07/26/2016  8:18 AM    Scribe for Treatment Team:  Roque Lias LCSW 07/26/2016 8:18 AM

## 2016-07-26 NOTE — Progress Notes (Signed)
Pt discharged home with family member. Pt was ambulatory, stable and appreciative at that time. All papers and prescriptions were given and valuables returned. Verbal understanding expressed. Denies SI/HI and A/VH. Pt given opportunity to express concerns and ask questions.  

## 2016-07-26 NOTE — Progress Notes (Signed)
  [facility] :  Will you be returning to the same living situation after discharge:  No. At discharge, do you have transportation home?: Yes,  [facility] from [place] to pick up Do you have the ability to pay for your medications: Yes,  MCD  Release of information consent forms completed and in the chart;  Patient's signature needed at discharge.  Patient to Follow up at: Follow-up Information    [facility] Follow up.   Why:  Go to the walk-in clinic Tues or Thurs between 8:30 and 11 for your hospital follow up appointment Contact information: [address]  [address]  [place] [phone]          Next level of care provider [facility]:no  Safety Planning and Suicide Prevention discussed: Yes,  yes  Have you used any form of tobacco in the last 30 days? (Cigarettes, Smokeless Tobacco, Cigars, and/or Pipes): Yes  Has patient been referred to the Quitline?: Patient refused referral  Patient has been referred for addiction treatment: Yes  Ida RogueRodney B Juneau Doughman 07/26/2016, 12:15 PM

## 2016-07-26 NOTE — [facility] Suicide Risk Assessment (Signed)
[facility]   Principal Problem: MDD (major depressive disorder), recurrent, severe, with psychosis (H[facility]) Discharge Diagnoses:  Patient Active Problem List   Diagnosis Date Noted  . Cocaine use disorder, severe, dependence (HCC) [F14.20] 07/20/2016  . MDD (major depressive disorder), recurrent, severe, with psychosis (HCC) [F33.3] 07/20/2016  . Cannabis use disorder, moderate, dependence (HCC) [F12.20] 07/20/2016  . Severe episode of recurrent major depressive disorder, without psychotic features (HCC) [F33.2]   . PTSD (post-traumatic stress disorder) [F43.10] 03/25/2016  . 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 Negative for depression, hallucinations and suicidal ideas. The patient is not nervous/anxious.   All other systems reviewed and are negative.   Blood pressure (!) 141/78, pulse (!) 129, temperature 98.7 F (37.1 C), temperature source Oral, resp. rate 16, height 5\' 6"  (1.676 m), weight 98 kg (216 lb), last menstrual period 07/13/2016, SpO2 100 %.Body mass index is 34.86 kg/m.  General Appearance: Casual  Eye Contact::  Fair  Speech:  Normal Rate409  Volume:  Normal  Mood:  Euthymic  Affect:  Congruent  Thought Process:  Goal Directed and Descriptions of Associations: Intact  Orientation:  Full (Time, Place, and Person)  Thought Content:  Logical  Suicidal Thoughts:  No  Homicidal Thoughts:  No  Memory:  Immediate;   Fair Recent;   Fair Remote;   Fair  Judgement:  Fair  Insight:  Fair  Psychomotor Activity:  Normal  Concentration:  Fair  Recall:  FiservFair  Fund of Knowledge:Fair  Language: Fair  Akathisia:  No  Handed:   Right  AIMS (if indicated):     Assets:  Communication Skills Desire for Improvement  Sleep:  Number of Hours: 4.25  Cognition: WNL  ADL's:  Intact   Mental Status Per Nursing Assessment::   On Admission:     Demographic Factors:  NA  Loss Factors: NA  Historical Factors: Impulsivity  Risk Reduction Factors:   Positive therapeutic relationship  Continued Clinical Symptoms:  Alcohol/Substance Abuse/Dependencies Previous Psychiatric Diagnoses and Treatments  Cognitive Features That Contribute To Risk:  Closed-mindedness    Suicide Risk:  Minimal: No identifiable suicidal ideation.  Patients presenting with no risk factors but with morbid ruminations; may be classified as minimal risk based on the severity of the depressive symptoms  Follow-up Information    [facility] Follow up.   Why:  Referral sent Tuesday Contact information: [address]  [place]  [[phone]          Plan Of Care/Follow-up recommendations:  Activity:  no restrictions Diet:  regular Tests:  as needed Other:  follow up with aftercare  Makaylynn Bonillas, MD 07/26/2016, 11:50 AM

## 2017-06-20 IMAGING — CR DG CHEST 2V
2 series · 2 of 2 positions shown · non-contrast
Comparison: 03/23/2016

CLINICAL DATA: Chest pain, cough, shortness of breath since
yesterday

EXAM:
CHEST  2 VIEW

[chest pa]
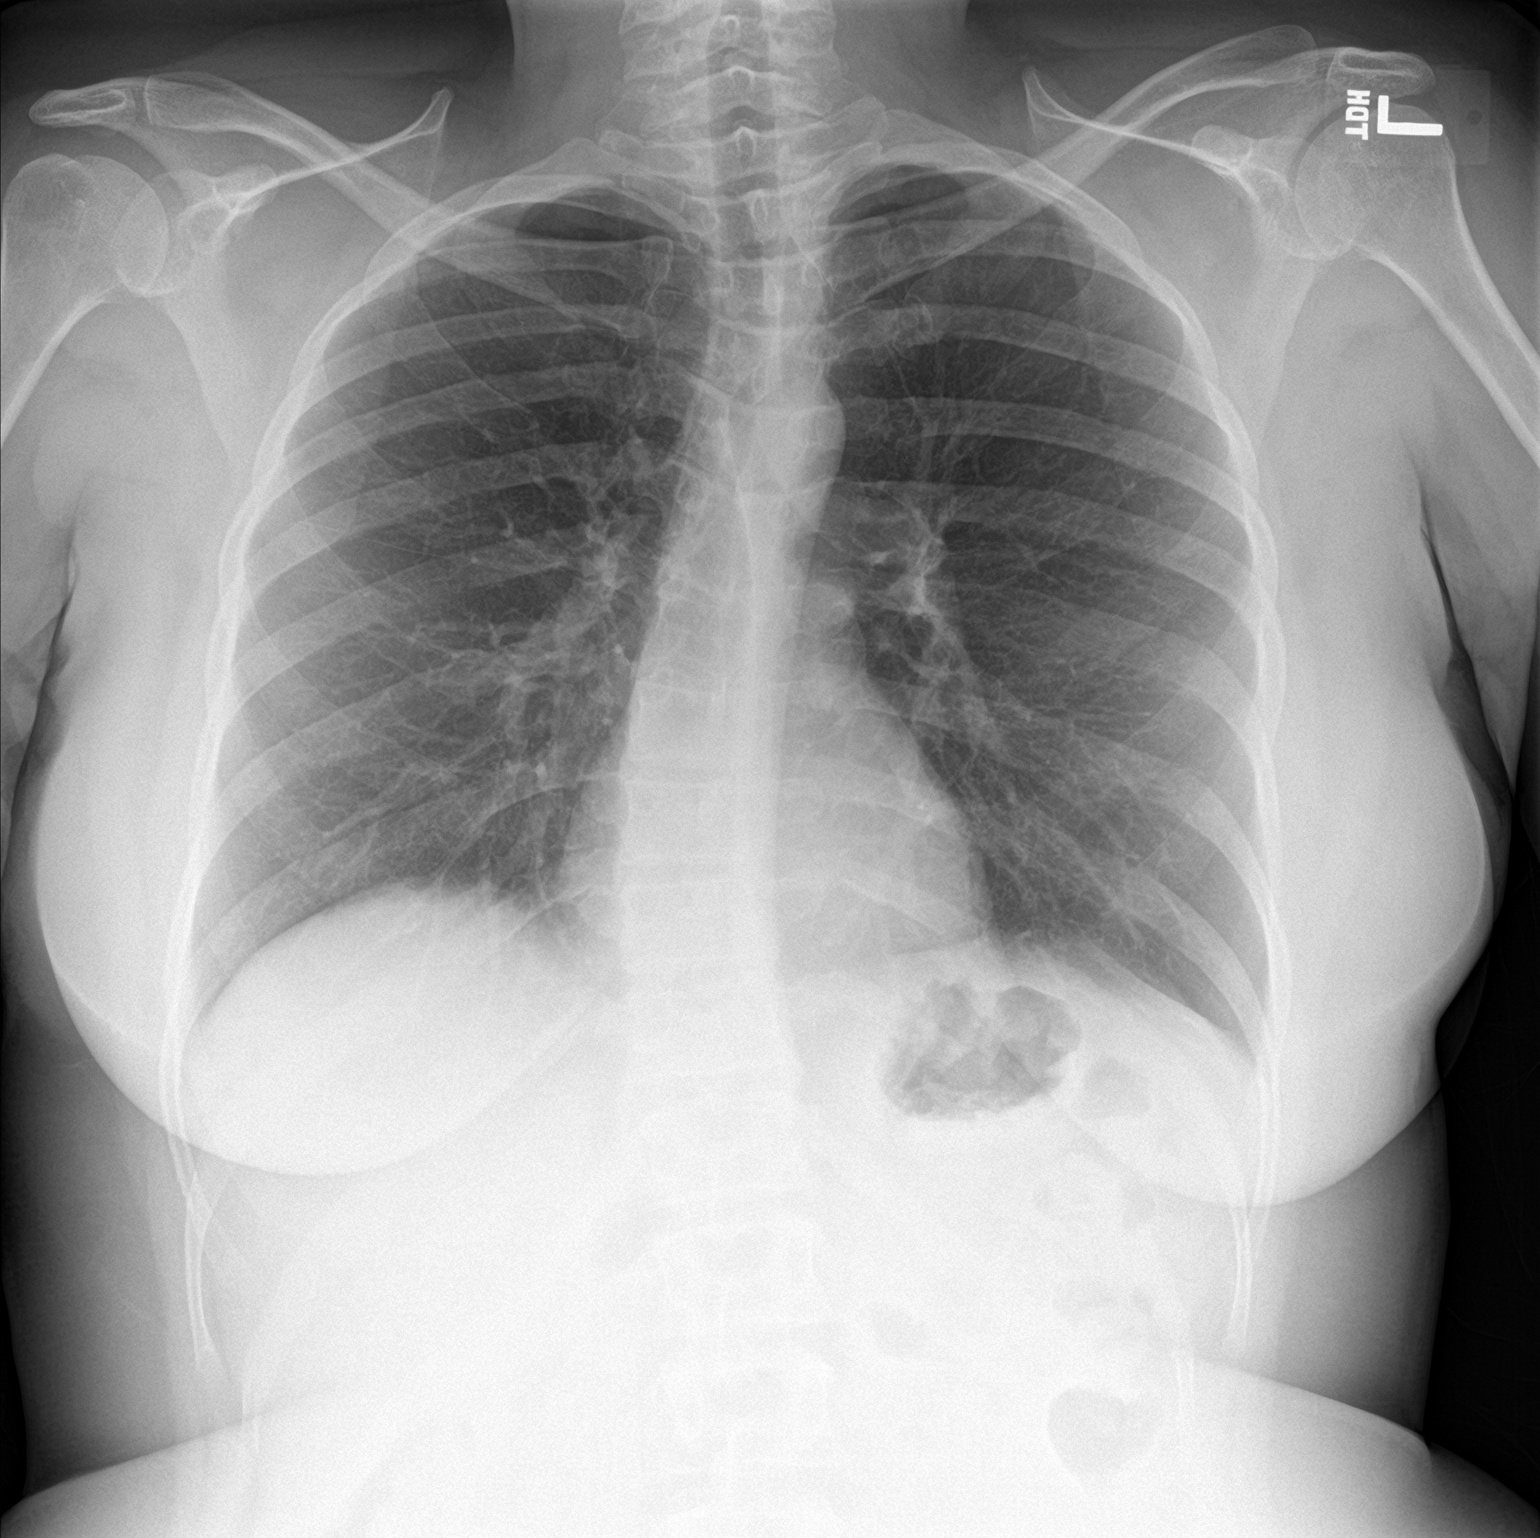

[chest lat]
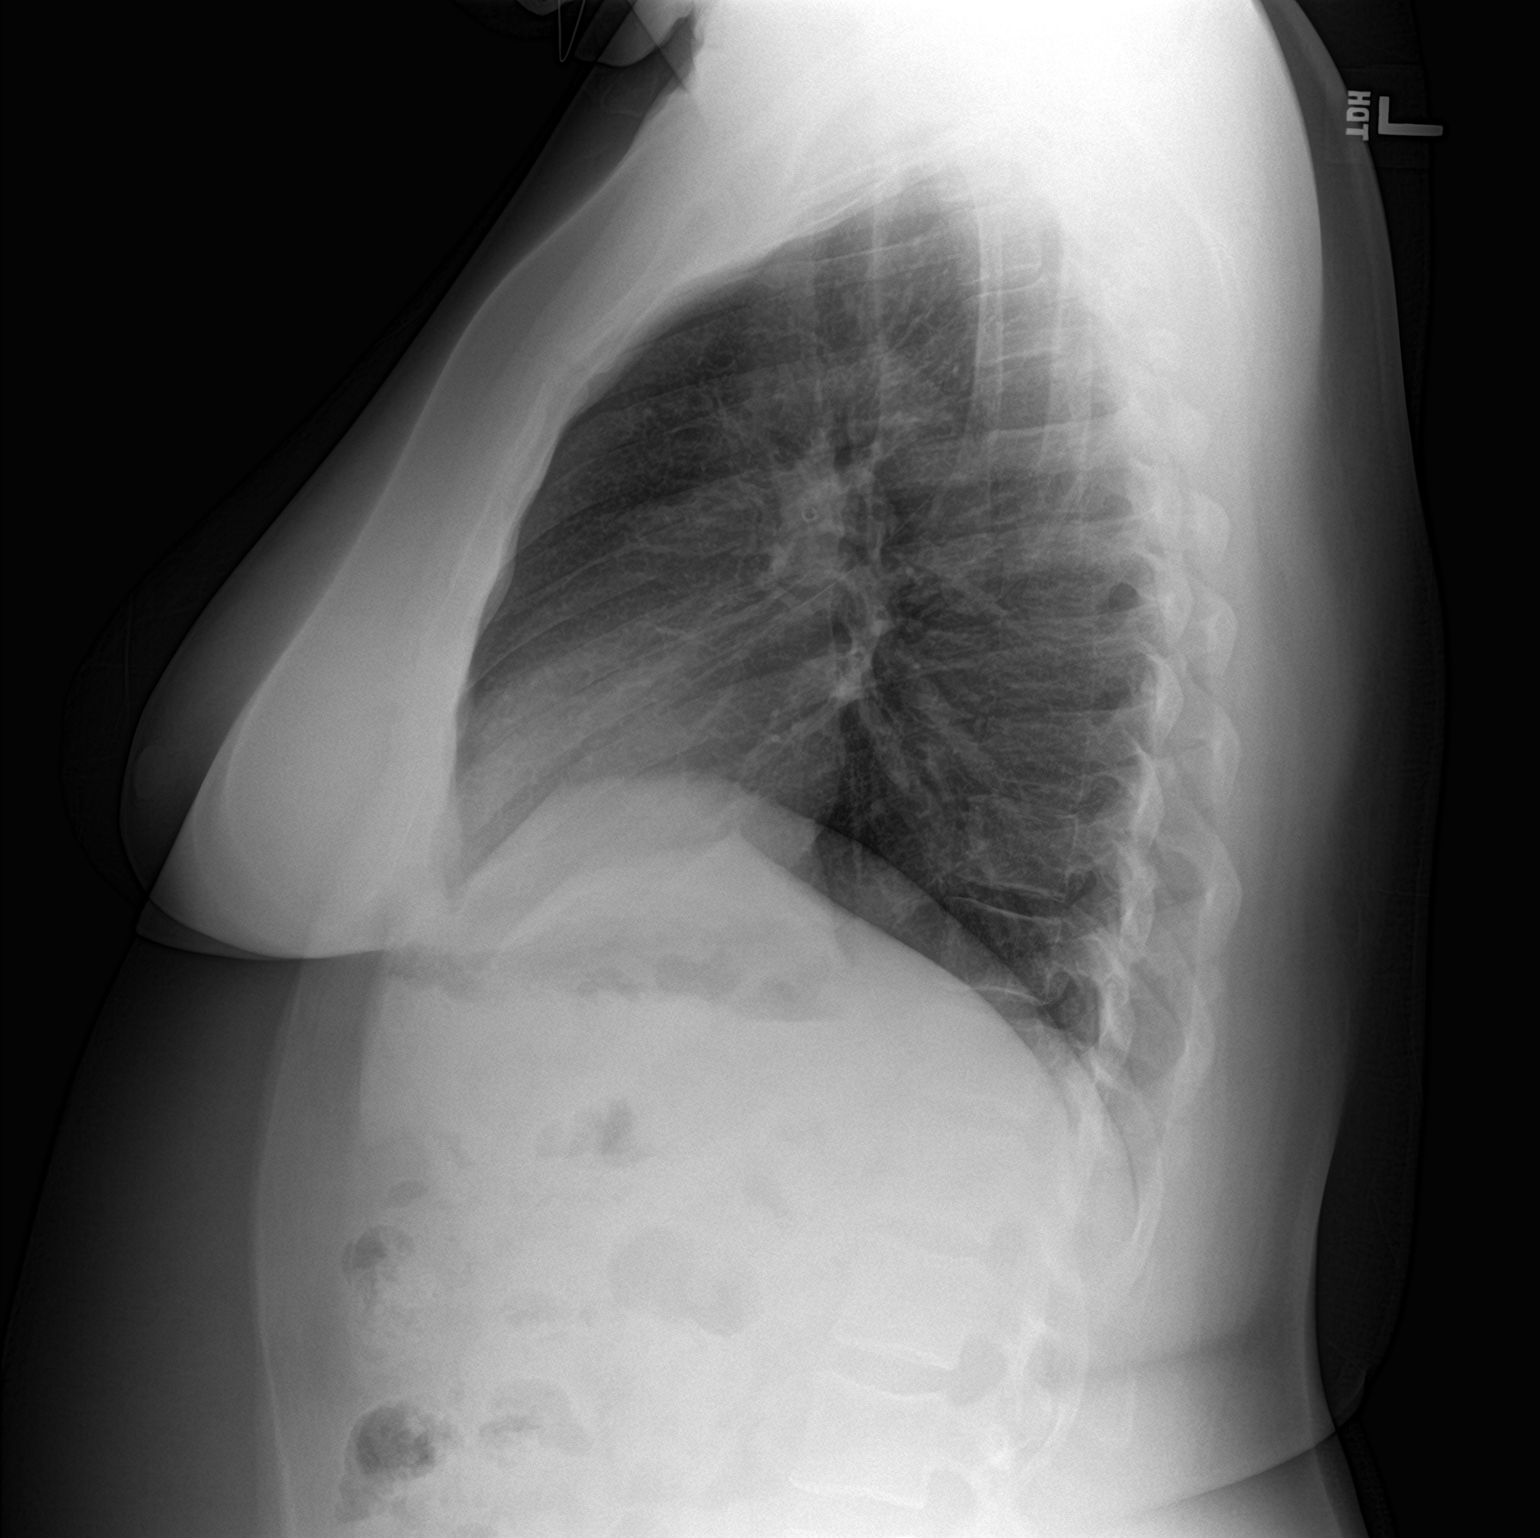

[2 of 2 positions shown; findings below may reference images not displayed]

FINDINGS: The heart size and mediastinal contours are within normal limits.
Both lungs are clear. The visualized skeletal structures are
unremarkable.
IMPRESSION: No active cardiopulmonary disease.

## 2019-01-30 ENCOUNTER — Emergency Department (HOSPITAL_COMMUNITY): Payer: Medicaid Other

## 2019-01-30 ENCOUNTER — Other Ambulatory Visit: Payer: Self-pay

## 2019-01-30 ENCOUNTER — Emergency Department (HOSPITAL_COMMUNITY)
Admission: EM | Admit: 2019-01-30 | Discharge: 2019-01-31 | Disposition: A | Discharge status: Home / Self Care | Payer: Medicaid Other | Attending: Emergency Medicine | Admitting: Emergency Medicine

## 2019-01-30 DIAGNOSIS — F332 Major depressive disorder, recurrent severe without psychotic features: Secondary | ICD-10-CM | POA: Insufficient documentation

## 2019-01-30 DIAGNOSIS — R079 Chest pain, unspecified: Secondary | ICD-10-CM | POA: Insufficient documentation

## 2019-01-30 DIAGNOSIS — R440 Auditory hallucinations: Secondary | ICD-10-CM

## 2019-01-30 DIAGNOSIS — Z8659 Personal history of other mental and behavioral disorders: Secondary | ICD-10-CM

## 2019-01-30 DIAGNOSIS — Z79899 Other long term (current) drug therapy: Secondary | ICD-10-CM | POA: Insufficient documentation

## 2019-01-30 DIAGNOSIS — R45851 Suicidal ideations: Secondary | ICD-10-CM | POA: Insufficient documentation

## 2019-01-30 DIAGNOSIS — Z20828 Contact with and (suspected) exposure to other viral communicable diseases: Secondary | ICD-10-CM | POA: Insufficient documentation

## 2019-01-30 DIAGNOSIS — F1420 Cocaine dependence, uncomplicated: Secondary | ICD-10-CM | POA: Insufficient documentation

## 2019-01-30 DIAGNOSIS — R45850 Homicidal ideations: Secondary | ICD-10-CM | POA: Insufficient documentation

## 2019-01-30 DIAGNOSIS — F17210 Nicotine dependence, cigarettes, uncomplicated: Secondary | ICD-10-CM | POA: Insufficient documentation

## 2019-01-30 DIAGNOSIS — F1410 Cocaine abuse, uncomplicated: Secondary | ICD-10-CM

## 2019-01-30 LAB — URINALYSIS, ROUTINE W REFLEX MICROSCOPIC
Bilirubin Urine: NEGATIVE
Glucose, UA: NEGATIVE mg/dL
Hgb urine dipstick: NEGATIVE
Ketones, ur: NEGATIVE mg/dL
Nitrite: POSITIVE — AB
Protein, ur: NEGATIVE mg/dL
Specific Gravity, Urine: 1.013 (ref 1.005–1.030)
WBC, UA: >50 WBC/hpf — ABNORMAL HIGH (ref 0–5)
pH: 6 (ref 5.0–8.0)

## 2019-01-30 LAB — ACETAMINOPHEN LEVEL: Acetaminophen (Tylenol), Serum: <10 ug/mL — ABNORMAL LOW (ref 10–30)

## 2019-01-30 LAB — BASIC METABOLIC PANEL
Anion gap: 11 (ref 5–15)
BUN: 9 mg/dL (ref 6–20)
CO2: 24 mmol/L (ref 22–32)
Calcium: 8.7 mg/dL — ABNORMAL LOW (ref 8.9–10.3)
Chloride: 103 mmol/L (ref 98–111)
Creatinine, Ser: 0.92 mg/dL (ref 0.44–1.00)
GFR calc Af Amer: >60 mL/min (ref >60)
GFR calc non Af Amer: >60 mL/min (ref >60)
Glucose, Bld: 105 mg/dL — ABNORMAL HIGH (ref 70–99)
Potassium: 3.6 mmol/L (ref 3.5–5.1)
Sodium: 138 mmol/L (ref 135–145)

## 2019-01-30 LAB — CBC
HCT: 40.3 % (ref 36.0–46.0)
Hemoglobin: 13.1 g/dL (ref 12.0–15.0)
MCH: 29.3 pg (ref 26.0–34.0)
MCHC: 32.5 g/dL (ref 30.0–36.0)
MCV: 90.2 fL (ref 80.0–100.0)
Platelets: 252 10*3/uL (ref 150–400)
RBC: 4.47 MIL/uL (ref 3.87–5.11)
RDW: 13.4 % (ref 11.5–15.5)
WBC: 4.1 10*3/uL (ref 4.0–10.5)
nRBC: 0 % (ref 0.0–0.2)

## 2019-01-30 LAB — I-STAT BETA HCG BLOOD, ED (MC, WL, AP ONLY): I-stat hCG, quantitative: <5 m[IU]/mL (ref <5)

## 2019-01-30 LAB — RAPID URINE DRUG SCREEN, HOSP PERFORMED
Amphetamines: NOT DETECTED
Barbiturates: NOT DETECTED
Benzodiazepines: NOT DETECTED
Cocaine: POSITIVE — AB
Opiates: NOT DETECTED
Tetrahydrocannabinol: POSITIVE — AB

## 2019-01-30 LAB — TROPONIN I (HIGH SENSITIVITY)
Troponin I (High Sensitivity): 3 ng/L (ref <18)
Troponin I (High Sensitivity): 3 ng/L (ref <18)

## 2019-01-30 LAB — ETHANOL: Alcohol, Ethyl (B): <10 mg/dL (ref <10)

## 2019-01-30 LAB — SALICYLATE LEVEL: Salicylate Lvl: <7 mg/dL (ref 2.8–30.0)

## 2019-01-30 LAB — SARS CORONAVIRUS 2 (TAT 6-24 HRS): SARS Coronavirus 2: NEGATIVE

## 2019-01-30 MED ORDER — CEPHALEXIN 250 MG PO CAPS
500.0000 mg | ORAL_CAPSULE | Freq: Three times a day (TID) | ORAL | Status: DC
  Administered 2019-01-30 – 2019-01-31 (×2): 500 mg via ORAL
  Filled 2019-01-30 (×2): qty 2

## 2019-01-30 NOTE — ED Provider Notes (Signed)
[facility] EMERGENCY DEPARTMENT Provider Note   CSN: [id number] Arrival date & time: 01/30/19  1035     History   Chief Complaint Chief Complaint  Patient presents with  . Chest Pain  . Suicidal    HPI Amy Le is a 43 y.o. female.     The history is provided by the patient and medical records. No language interpreter was used.  Mental Health Problem Presenting symptoms: hallucinations, homicidal ideas, suicidal thoughts and suicidal threats   Presenting symptoms: no agitation and no suicide attempt   Degree of incapacity (severity):  Severe Onset quality:  Gradual Duration:  3 days Timing:  Constant Progression:  Waxing and waning Chronicity:  Recurrent Context: drug abuse and noncompliance   Treatment compliance:  Untreated Worsened by:  Drugs Ineffective treatments:  None tried Associated symptoms: chest pain   Associated symptoms: no abdominal pain, no fatigue and no headaches     Past Medical History:  Diagnosis Date  . Bipolar 1 disorder ([place])   . BV (bacterial vaginosis)   . Chlamydia   . Trichomonas     Patient Active Problem List   Diagnosis Date Noted  . Cocaine use disorder, severe, dependence ([place]) 07/20/2016  . MDD (major depressive disorder), recurrent, severe, with psychosis ([place]) 07/20/2016  . Cannabis use disorder, moderate, dependence ([place]) 07/20/2016  . Severe episode of recurrent major depressive disorder, without psychotic features ([place])   . PTSD (post-traumatic stress disorder) 03/25/2016  . 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 FOOT SURGERY    . MULTIPLE TOOTH EXTRACTIONS Bilateral 2017  . TUBAL LIGATION       OB History   No obstetric history on file.      Home Medications    Prior to Admission medications   Medication Sig Start Date End Date Taking? Authorizing Provider   hydrOXYzine (ATARAX/VISTARIL) 50 MG tablet Take 1 tablet (50 mg total) by mouth every 6 (six) hours as needed for anxiety. 07/26/16   Derrill Center, NP  lurasidone (LATUDA) 40 MG TABS tablet Take 1 tablet (40 mg total) by mouth daily with supper. 07/26/16   Derrill Center, NP  sertraline (ZOLOFT) 25 MG tablet Take 3 tablets (75 mg total) by mouth daily. 07/27/16   Derrill Center, NP  traZODone (DESYREL) 150 MG tablet Take 1 tablet (150 mg total) by mouth at bedtime as needed for sleep. 07/26/16   Derrill Center, NP    Family History Family History  Problem Relation Age of Onset  . Mental illness Neg Hx     Social History Social History   Tobacco Use  . Smoking status: Current Every Day Smoker    Packs/day: 0.50    Types: Cigarettes  . Smokeless tobacco: Never Used  . Tobacco comment: Refused  Substance Use Topics  . Alcohol use: No  . Drug use: Yes    Types: "Crack" cocaine, Cocaine, Marijuana    Comment: 60 dollars per day     Allergies   Patient has no known allergies.   Review of Systems Review of Systems  Constitutional: Negative for chills and fatigue.  HENT: Negative for congestion.   Respiratory: Positive for shortness of breath (resolved). Negative for cough, chest tightness and wheezing.   Cardiovascular: Positive for chest pain. Negative for palpitations and leg swelling.  Gastrointestinal: Negative for abdominal pain, constipation, diarrhea, nausea and vomiting.  Genitourinary: Negative  for flank pain.  Musculoskeletal: Negative for back pain.  Neurological: Negative for light-headedness and headaches.  Psychiatric/Behavioral: Positive for hallucinations, homicidal ideas and suicidal ideas. Negative for agitation and confusion.  All other systems reviewed and are negative.    Physical Exam Updated Vital Signs BP 140/83   Pulse 77   Temp 97.9 F (36.6 C) (Oral)   Resp 14   SpO2 100%   Physical Exam Vitals signs and nursing note reviewed.   Constitutional:      General: She is not in acute distress.    Appearance: She is well-developed. She is not ill-appearing, toxic-appearing or diaphoretic.  HENT:     Head: Normocephalic and atraumatic.     Right Ear: External ear normal.     Left Ear: External ear normal.     Nose: Nose normal.     Mouth/Throat:     Pharynx: No oropharyngeal exudate.  Eyes:     Extraocular Movements: Extraocular movements intact.     Conjunctiva/sclera: Conjunctivae normal.     Pupils: Pupils are equal, round, and reactive to light.  Neck:     Musculoskeletal: Normal range of motion and neck supple.  Cardiovascular:     Rate and Rhythm: Normal rate and regular rhythm.     Heart sounds: Normal heart sounds. No murmur.  Pulmonary:     Effort: Pulmonary effort is normal. No tachypnea or respiratory distress.     Breath sounds: No stridor. No wheezing, rhonchi or rales.  Chest:     Chest wall: Tenderness present.  Abdominal:     General: There is no distension.     Tenderness: There is no abdominal tenderness. There is no rebound.  Musculoskeletal:     Right lower leg: She exhibits no tenderness. No edema.     Left lower leg: She exhibits no tenderness. No edema.  Skin:    General: Skin is warm.     Findings: No erythema or rash.  Neurological:     General: No focal deficit present.     Mental Status: She is alert and oriented to person, place, and time.     Motor: No abnormal muscle tone.     Coordination: Coordination normal.     Deep Tendon Reflexes: Reflexes are normal and symmetric.  Psychiatric:        Attention and Perception: She perceives auditory hallucinations.        Thought Content: Thought content includes homicidal and suicidal ideation. Thought content includes suicidal plan. Thought content does not include homicidal plan.      ED Treatments / Results  Labs (all labs ordered are listed, but only abnormal results are displayed) Labs Reviewed  BASIC METABOLIC PANEL -  Abnormal; Notable for the following components:      Result Value   Glucose, Bld 105 (*)    Calcium 8.7 (*)    All other components within normal limits  ACETAMINOPHEN LEVEL - Abnormal; Notable for the following components:   Acetaminophen (Tylenol), Serum <10 (*)    All other components within normal limits  SARS CORONAVIRUS 2 (TAT 6-24 HRS)  CBC  ETHANOL  SALICYLATE LEVEL  RAPID URINE DRUG SCREEN, HOSP PERFORMED  I-STAT BETA HCG BLOOD, ED ([facility], [facility], [facility] ONLY)  I-STAT BETA HCG BLOOD, ED ([facility], [facility], [facility] ONLY)  TROPONIN I (HIGH SENSITIVITY)  TROPONIN I (HIGH SENSITIVITY)    EKG EKG Interpretation  Date/Time:  Tuesday January 30 2019 10:39:08 EST Ventricular Rate:  80 PR Interval:  118 QRS  Duration: 86 QT Interval:  392 QTC Calculation: 452 R Axis:   69 Text Interpretation: Normal sinus rhythm Minimal voltage criteria for LVH, may be normal variant ( Sokolow-Lyon ) Borderline ECG When compared to prior, no signigficnat cahnges seen. No STEMI Confirmed by Theda Belfastegeler, Chris ([id number]) on 01/30/2019 2:02:45 PM   Radiology Dg Chest 2 View  Result Date: 01/30/2019 CLINICAL DATA:  Chest pain, confusion EXAM: CHEST - 2 VIEW COMPARISON:  June 06, 2016 FINDINGS: The heart size and mediastinal contours are within normal limits. Both lungs are clear. No pleural effusion or pneumothorax. The visualized skeletal structures are unremarkable. Dextrocurvature of the thoracic spine. IMPRESSION: No acute process in the chest. Electronically Signed   By: Guadlupe SpanishPraneil  Patel M.D.   On: 01/30/2019 11:09    Procedures Procedures (including critical care time)  Medications Ordered in ED Medications - No data to display   Initial Impression / Assessment and Plan / ED Course  I have reviewed the triage vital signs and the nursing notes.  Pertinent labs & imaging results that were available during my care of the patient were reviewed by me and considered in my medical decision making (see chart for details).         [name] is a 43 y.o. female with a past medical history significant for polysubstance abuse, depression, PTSD, and bipolar disorder who presents with cocaine use, intermittent chest pain, and suicidal ideation.  Patient reports that for the last 3 days she was doing cocaine and is having some chest pain with it.  She reports that it is not pleuritic but is slightly exertional.  She reports it is in her central chest and has currently resolved.  She denies any radiation of the pain but she reports it does have associated shortness of breath at times.  She denies recent trauma.  She denies fevers, chills, congestion, or cough.  She denies any leg pain or leg swelling.  No history of DVT or PE.  She reports that she has had this pain in the past.  She is also concerned because she is having suicidal ideation as well as some mild homicidal ideation and hallucinations.  She reports that she is hearing command hallucinations with voices telling her to hurt herself and others.  It is also telling her about Covid.  Patient reports that she has not had any Covid contact to her knowledge.  She reports has not been on her psychiatric medicine for around 1 month and thinks this could be contributing.  She reports that she is try to hurt her self in the past by slitting her wrist with a razor and reports that she got a razor out, went to the bathroom today, and was nearly going to slit her wrist when she decided to get help.  She does not have a plan to anyone else at this time.  Patient reports that she wants help and does not want to leave until she gets help and is voluntary at this time.  EKG shows no STEMI.  On exam, lungs clear and chest is lozenge palpation.  Good pulses in extremities.  No focal neurologic deficits.  Patient resting comfortably.  Patient is still reporting suicidal ideation as well as auditory hallucinations.  Clinically I am concerned that patient will need TTS evaluation.   At this time, she is voluntary but if he tries to leave I would consider IVC.    Patient's work-up began to return and delta troponin is negative.  CBC  reassuring.  Creatinine is normal.  Chest x-ray shows no pneumonia.  Heart score calculated as a 1.  Given her delta troponin negative, we feel she is medically cleared from a cardiac standpoint at this time.  Suspect her symptoms are related to cocaine use primarily.  Patient will see TTS for further management recommendations.     Final Clinical Impressions(s) / ED Diagnoses   Final diagnoses:  Suicidal ideation  Suicidal thoughts  History of command hallucinations  Auditory hallucinations  Cocaine abuse (HCC)  Chest pain, unspecified type    Clinical Impression: 1. Suicidal ideation   2. Suicidal thoughts   3. History of command hallucinations   4. Auditory hallucinations   5. Cocaine abuse (HCC)   6. Chest pain, unspecified type     Disposition: Awaiting psychiatric recommendations for suicidal ideation with a plan to slit wrists, homicidal ideation, and auditory hallucinations commanding her to hurt her self and others.  This note was prepared with assistance of Conservation officer, historic buildings. Occasional wrong-word or sound-a-like substitutions may have occurred due to the inherent limitations of voice recognition software.      Cleland Simkins, Canary Brim, MD 01/30/19 [phone]

## 2019-01-30 NOTE — ED Triage Notes (Signed)
Patient to ER for evaluation of chest pain for "a few days" with recent cocaine use. Also reports SI thoughts with a plan to cut herself. She reports she was 3 minutes from cutting herself before calling EMS and reports she feels unsafe alone.

## 2019-01-30 NOTE — ED Notes (Signed)
Diet ordered 

## 2019-01-30 NOTE — BH Assessment (Signed)
Tele Assessment Note   Patient Name: Amy Le MRN: [id number] Referring Physician: Tegeler Location of Patient: [facility] Location of Provider: Behavioral Health TTS Department  Amy RankinJacquetta Le is an 43 y.o. female who [facility] with complaints of chest pain most likely resulting from her cocaine use and she advised medical staff that she was also having suicidal ideation.  Patient states, "I came to the hospital because I was having thoughts to cut myself and I wanted to get get before I did something."  Patient [facility] and was prescribed medications, but states that she did not keep her aftercare appointment and states that she has been off her medications for several months.  Patient states that she has had prior suicide attempts, but states that they were years ago.  Patient states that "when I get to this point, it scares me."  Patient denies HI, but states that she periodically hears voices associated with Covid like "people are going to die and I am going to die."  Patient states that she has been using cocaine since the age of [age] and states that her last use was two days ago.  Patient states that she uses $100 worth on weekends.  Patient states that she occasionally smokes marijuana, but states that she does not buy it for herself. Patient states that she averages sleeping six hours per night, but states that her appetite has been decreased, but she has not experienced any weight loss.  Patient denies any history of abuse, but states that she has self-mutilated in the past by cutting. Patient states that she is currently homeless and staying with various and alternating friends.  She states that she is currently unemployed.  Patient presented as oriented and alert.  Her mood was overall pleasant with minimal obvious depression.  Her thoughts were organized and her memory intact.  She did not appear to be responding to any internal stimuli.   Due to her drug use, her judgment, insight and impulse control are impaired.  Her eye contact was good and her speech coherent.  Diagnosis: F33.2 MDD Recurrent Severe, F14.20 Cocaine Use Disorder Severe  Past Medical History:  Past Medical History:  Diagnosis Date  . Bipolar 1 disorder (HCC)   . BV (bacterial vaginosis)   . Chlamydia   . Trichomonas     Past Surgical History:  Procedure Laterality Date  . FOOT SURGERY    . MULTIPLE TOOTH EXTRACTIONS Bilateral 2017  . TUBAL LIGATION      Family History:  Family History  Problem Relation Age of Onset  . Mental illness Neg Hx     Social History:  reports that she has been smoking cigarettes. She has been smoking about 0.50 packs per day. She has never used smokeless tobacco. She reports current drug use. Drugs: "Crack" cocaine, Cocaine, and Marijuana. She reports that she does not drink alcohol.  Additional Social History:  Alcohol / Drug Use Pain Medications: see MAR Prescriptions: see MAR Over the Counter: see MAR History of alcohol / drug use?: Yes Longest period of sobriety (when/how long): none reported Negative Consequences of Use: Financial, Legal, Personal relationships, Work / School Substance #1 Name of Substance 1: cocaine 1 - Age of First Use: 22 1 - Amount (size/oz): $100 1 - Frequency: weekends 1 - Duration: since onset 1 - Last Use / Amount: 2 days ago  CIWA: CIWA-Ar BP: (!) 128/96 Pulse Rate: 77 COWS:    Allergies:  No Known Allergies  Home Medications: (Not in a hospital admission)   OB/GYN Status:  No LMP recorded.  General Assessment Data Location of Assessment: [facility] ED TTS Assessment: In system Is this a Tele or Face-to-Face Assessment?: Tele Assessment Is this an Initial Assessment or a Re-assessment for this encounter?: Initial Assessment Patient Accompanied by:: N/A Language Other than English: No Living Arrangements: Homeless/Shelter What gender do you identify as?: Female Marital  status: Single Pregnancy Status: No Living Arrangements: Other (Comment)(homeless, bounces around from friend to friend) Can pt return to current living arrangement?: Yes Admission Status: Voluntary Is patient capable of signing voluntary admission?: Yes Referral Source: Self/Family/Friend Insurance type: self pay     Crisis Care Plan Living Arrangements: Other (Comment)(homeless, bounces around from friend to friend) Legal Guardian: Other:(self) Name of Psychiatrist: none Name of Therapist: none  Education Status Is patient currently in school?: No Is the patient employed, unemployed or receiving disability?: Unemployed  Risk to self with the past 6 months Suicidal Ideation: Yes-Currently Present Has patient been a risk to self within the past 6 months prior to admission? : Yes Suicidal Intent: No-Not Currently/Within Last 6 Months(wants help before she cuts) Has patient had any suicidal intent within the past 6 months prior to admission? : No Is patient at risk for suicide?: Yes Suicidal Plan?: Yes-Currently Present Has patient had any suicidal plan within the past 6 months prior to admission? : Yes Specify Current Suicidal Plan: cutting Access to Means: Yes Specify Access to Suicidal Means: household sharpes What has been your use of drugs/alcohol within the last 12 months?: cocaine use Previous Attempts/Gestures: Yes How many times?: (several) Other Self Harm Risks: homeless and addiction problem Triggers for Past Attempts: Hallucinations Intentional Self Injurious Behavior: Cutting Comment - Self Injurious Behavior: several years ago Family Suicide History: No Recent stressful life event(s): Job Loss, Other (Comment)(homeless) Persecutory voices/beliefs?: Yes Depression: Yes Depression Symptoms: Insomnia, Loss of interest in usual pleasures, Feeling worthless/self pity Substance abuse history and/or treatment for substance abuse?: Yes Suicide prevention information  given to non-admitted patients: Not applicable  Risk to Others within the past 6 months Homicidal Ideation: No Does patient have any lifetime risk of violence toward others beyond the six months prior to admission? : No Thoughts of Harm to Others: No Current Homicidal Intent: No Current Homicidal Plan: No Access to Homicidal Means: No Identified Victim: none History of harm to others?: No Assessment of Violence: None Noted Violent Behavior Description: none Does patient have access to weapons?: No Criminal Charges Pending?: No Does patient have a court date: No Is patient on probation?: No  Psychosis Hallucinations: Auditory Delusions: None noted  Mental Status Report Appearance/Hygiene: Unremarkable Eye Contact: Good Motor Activity: Unremarkable Speech: Logical/coherent Level of Consciousness: Alert Mood: Depressed, Apathetic Affect: Flat Anxiety Level: Minimal Thought Processes: Coherent, Relevant Judgement: Impaired Orientation: Person, Place, Time, Situation Obsessive Compulsive Thoughts/Behaviors: Severe(with cocaine use)  Cognitive Functioning Concentration: Normal Memory: Recent Intact, Remote Intact Is patient IDD: No Insight: Poor Impulse Control: Poor Appetite: Fair Have you had any weight changes? : No Change Sleep: Decreased Total Hours of Sleep: (6) Vegetative Symptoms: Decreased grooming  ADLScreening [facility] Assessment Services) Patient's cognitive ability adequate to safely complete daily activities?: Yes Patient able to express need for assistance with ADLs?: Yes Independently performs ADLs?: Yes (appropriate for developmental age)  Prior Inpatient Therapy Prior Inpatient Therapy: Yes Prior Therapy Dates: 07/2018 Prior Therapy Facilty/Provider(s): [facility] Reason for Treatment: depression  Prior Outpatient Therapy Prior Outpatient Therapy: No  Does patient have an ACCT team?: No Does patient have Intensive In-House Services?  : No Does patient  have [facility] services? : No Does patient have P4CC services?: No  ADL Screening (condition at time of admission) Patient's cognitive ability adequate to safely complete daily activities?: Yes Is the patient deaf or have difficulty hearing?: No Does the patient have difficulty seeing, even when wearing glasses/contacts?: No Does the patient have difficulty concentrating, remembering, or making decisions?: No Patient able to express need for assistance with ADLs?: Yes Does the patient have difficulty dressing or bathing?: No Independently performs ADLs?: Yes (appropriate for developmental age) Does the patient have difficulty walking or climbing stairs?: No Weakness of Legs: None  Home Assistive Devices/Equipment Home Assistive Devices/Equipment: None  Therapy Consults (therapy consults require a physician order) PT Evaluation Needed: No OT Evalulation Needed: No SLP Evaluation Needed: No Abuse/Neglect Assessment (Assessment to be complete while patient is alone) Abuse/Neglect Assessment Can Be Completed: Yes Physical Abuse: Denies Verbal Abuse: Denies Sexual Abuse: Denies Exploitation of patient/patient's resources: Denies Self-Neglect: Denies Values / Beliefs Cultural Requests During Hospitalization: None Spiritual Requests During Hospitalization: None Consults Spiritual Care Consult Needed: No Social Work Consult Needed: No Merchant navy officer (For Healthcare) Does Patient Have a Medical Advance Directive?: No Would patient like information on creating a medical advance directive?: No - Patient declined Nutrition Screen- [facility] Adult/WL/AP Has the patient recently lost weight without trying?: No Has the patient been eating poorly because of a decreased appetite?: No Malnutrition Screening Tool Score: 0        Disposition: Per Denzil Magnuson, NP, patient will be observed and monitored overnight for safety, withdrawal potential and chest pain and will be re-evaluated in the  morning by the provider. Disposition Initial Assessment Completed for this Encounter: Yes  This service was provided via telemedicine using a 2-way, interactive audio and video technology.  Names of all persons participating in this telemedicine service and their role in this encounter. Name: Anaysha Andre Role: patient  Name: Enola Siebers Role: TTS  Name:  Role:   Name:  Role:     Daphene Calamity 01/30/2019 5:43 PM

## 2019-01-30 NOTE — ED Notes (Addendum)
Pt ambulatory to Rm 51 - wearing gowns - Sitter w/pt. Pt noted to be alert, oriented, calm, cooperative, and pleasant. Pt aware has UTI - Keflex given. Pt ate dinner then laid down on bed - eyes closed. Respirations even, unlabored. Pt has been noncompliant w/meds. Pharm Tech advised pt appears to have been off meds since 2018. Pt report noncompliance.

## 2019-01-31 NOTE — ED Notes (Signed)
Diet was ordered for Lunch. 

## 2019-01-31 NOTE — ED Notes (Signed)
Patient was given a Snack and Drink. 

## 2019-01-31 NOTE — ED Notes (Addendum)
Patient verbalizes understanding of discharge instructions. Opportunity for questioning and answers were provided. pt discharged from ED with cab voucher.   Patient belongings returned from locker #2 as well as valuables from security.

## 2019-01-31 NOTE — Progress Notes (Signed)
Patient ID: Amy Le, female   DOB: 07/20/1975, 43 y.o.   MRN: [id number]   Psychiatric re-assessment   In brief; Amy Le is an 43 y.o. female who presented to [facility] with complaints of chest pain most likely resulting from her cocaine use and she advised medical staff that she was also having suicidal ideation.  She initially reported "I came to the hospital because I was having thoughts to cut myself and I wanted to get get before I did something."   During this evaluation, patient is alert and oriented x4, calm and cooperative. She has a long history of crack cocaine use and reports she relapsed two days ago. Since her relapse, she reports feelings of depression and guilt as her parents and kids will not speak to her. Reports sobriety of one year prior to her relapse. Reports since relapsing, she has used crack cocaine every other day. Reports having thoughts of cutting herself due to her cocaine use. She denies wanting to die although reports she would like to have some long-term substance abuse treatment. She denies hearing voices at this time and denies other psychosis. She denies homicidal ideations. She does have multiple ED visits and psychiatric admission secondary to her substance abuse.   Based on my evaluation, patient does not meet inpatient psychiatric admission although substance abuse treatment is needed. Patient will be psychiatrically cleared at this time with community resources for substance abuse treatment. Peer support has been contacted to speak with patient priior to discharge.  ED nurse updated on current disposition and reports she will convey the disposition to EDP.

## 2019-01-31 NOTE — ED Notes (Signed)
Breakfast Ordered 

## 2019-01-31 NOTE — ED Provider Notes (Signed)
Emergency Medicine Observation Re-evaluation Note  Amy Le is a 43 y.o. female, seen on rounds today.  Pt initially presented to the ED for complaints of Chest Pain, Suicidal, and Addiction Problem Currently, the patient is pending evaluation by psychiatry for disposition.  Physical Exam  BP (!) 112/98 (BP Location: Right Arm)   Pulse 84   Temp 99 F (37.2 C) (Oral)   Resp 18   SpO2 99%  Physical Exam Vitals signs and nursing note reviewed.  Constitutional:      Appearance: She is well-developed.     Comments: Pt sleeping   HENT:     Head: Normocephalic and atraumatic.  Eyes:     Conjunctiva/sclera: Conjunctivae normal.  Cardiovascular:     Rate and Rhythm: Normal rate.  Pulmonary:     Effort: Pulmonary effort is normal.  Abdominal:     Palpations: Abdomen is soft.  Skin:    General: Skin is warm.     ED Course / MDM  EKG:EKG Interpretation  Date/Time:  Tuesday January 30 2019 10:39:08 EST Ventricular Rate:  80 PR Interval:  118 QRS Duration: 86 QT Interval:  392 QTC Calculation: 452 R Axis:   69 Text Interpretation: Normal sinus rhythm Minimal voltage criteria for LVH, may be normal variant ( Sokolow-Lyon ) Borderline ECG When compared to prior, no signigficnat cahnges seen. No STEMI Confirmed by Antony Blackbird [phone]) on 01/30/2019 2:02:45 PM    I have reviewed the labs performed to date as well as medications administered while in observation.  Plan  Current plan is for psychiatric re-evaluation and disposition . Patient is not under full IVC at this time.   Rolla Kedzierski, [place] N, PA-C 01/31/19 1001    Lennice Sites, DO 01/31/19 1038

## 2019-04-17 ENCOUNTER — Other Ambulatory Visit: Payer: Self-pay | Admitting: Behavioral Health

## 2019-04-17 ENCOUNTER — Encounter (HOSPITAL_COMMUNITY): Payer: Self-pay | Admitting: Psychiatry

## 2019-04-17 ENCOUNTER — Observation Stay (HOSPITAL_COMMUNITY)
Admission: RE | Admit: 2019-04-17 | Discharge: 2019-04-18 | Disposition: A | Discharge status: Home / Self Care | Payer: Self-pay | Attending: Psychiatry | Admitting: Psychiatry

## 2019-04-17 ENCOUNTER — Other Ambulatory Visit: Payer: Self-pay

## 2019-04-17 DIAGNOSIS — F1410 Cocaine abuse, uncomplicated: Secondary | ICD-10-CM | POA: Insufficient documentation

## 2019-04-17 DIAGNOSIS — Z20822 Contact with and (suspected) exposure to covid-19: Secondary | ICD-10-CM | POA: Insufficient documentation

## 2019-04-17 DIAGNOSIS — F17210 Nicotine dependence, cigarettes, uncomplicated: Secondary | ICD-10-CM | POA: Insufficient documentation

## 2019-04-17 DIAGNOSIS — F329 Major depressive disorder, single episode, unspecified: Principal | ICD-10-CM | POA: Insufficient documentation

## 2019-04-17 LAB — RESPIRATORY PANEL BY RT PCR (FLU A&B, COVID)
Influenza A by PCR: NEGATIVE
Influenza B by PCR: NEGATIVE
SARS Coronavirus 2 by RT PCR: NEGATIVE

## 2019-04-17 MED ORDER — TRAZODONE HCL 50 MG PO TABS
50.0000 mg | ORAL_TABLET | Freq: Every evening | ORAL | Status: DC | PRN
  Administered 2019-04-17: 50 mg via ORAL
  Filled 2019-04-17 (×2): qty 1

## 2019-04-17 NOTE — Progress Notes (Signed)
Pt accepted to [facility]; 407-01.      Shuvon Rankin, NP is the accepting provider.    Dr. Jeannine Kitten is the attending provider.    Call report to [phone].        Pt is Voluntary.    Pt may be admitted to the unit pending COVID screen and after 3:00pm.   Drucilla Schmidt, MSW, LCSW-A Clinical Disposition Social Worker [employer] [facility]/TTS [phone]

## 2019-04-17 NOTE — H&P (Signed)
BH Observation Unit Provider Admission PAA/H&P  Patient Identification: Amy Le MRN:  [id number] Date of Evaluation:  04/17/2019 Chief Complaint:  MDD REC SEV COCAINE USE DISORDER; SEVERE Principal Diagnosis: <principal problem not specified> Diagnosis:  Active Problems:   * No active hospital problems. *  History of Present Illness: Amy Le is an 44 y.o. female.who presents to [facility], voluntarily. Patient endorses depression with symptoms described as feelings of hopelessness, worthlessness, anhedonia, irritability. Decreased sleep, poor appetite. She endorses intermittent  thoughts of suicide and when asked if she felt safe leaving the hospital today she replied," I know that I cant keep myself safe." She reports a history of multiple psychiatric hospitalizations  per chart review, patient has been admitted to [facility] at least 4 times although in the distant past. Her psychiatric diagnoses include polysubstance abuse,MDD, and PTSD. She admits that she has been using cocaine, daily for the past 7-8 months along with mariajuana and occasional alcohol use. She is tearful during this evaluation and states," I just want help for my depression and substance abuse. If not, I know I will end up dead." She endorses auditory hallucinations described as,"hearing voices degrading me." She denies other hallucinations or psychosis .She denies homicidal ideations. She denies access to weapons.  She reports she is not currently on any psychiatric medications and does not have outpatient psychiatric services.She identifies another stressor as not having a stable place to stay. She reports she has been living off and on with associated and on the streets. She denies a plan to harm herself although she is unable to contract for safety.     Associated Signs/Symptoms: Depression Symptoms:  depressed mood, anhedonia, feelings of worthlessness/guilt, hopelessness, (Hypo) Manic Symptoms:  none Anxiety  Symptoms:  Excessive Worry, Psychotic Symptoms:  Hallucinations: Auditory PTSD Symptoms: NA Total Time spent with patient: 20 minutes  Past Psychiatric History: Bipolar disorder: polysubstance abuse,MDD, and PTSD  Is the patient at risk to self? Yes.    Has the patient been a risk to self in the past 6 months? No.  Has the patient been a risk to self within the distant past? Yes.    Is the patient a risk to others? No.  Has the patient been a risk to others in the past 6 months? No.  Has the patient been a risk to others within the distant past? No.   Prior Inpatient Therapy: Prior Inpatient Therapy: Yes Prior Therapy Dates: 2019 Prior Therapy Facilty/Provider(s): [facility] Reason for Treatment: depression/SA Prior Outpatient Therapy: Prior Outpatient Therapy: No Does patient have an ACCT team?: No Does patient have Intensive In-House Services?  : No Does patient have [facility] services? : No Does patient have P4CC services?: No  Alcohol Screening:   Substance Abuse History in the last 12 months:  Yes.   Consequences of Substance Abuse: NA Previous Psychotropic Medications: Yes  Psychological Evaluations: Yes  Past Medical History:  Past Medical History:  Diagnosis Date  . Bipolar 1 disorder (HCC)   . BV (bacterial vaginosis)   . Chlamydia   . Trichomonas     Past Surgical History:  Procedure Laterality Date  . FOOT SURGERY    . MULTIPLE TOOTH EXTRACTIONS Bilateral 2017  . TUBAL LIGATION     Family History:  Family History  Problem Relation Age of Onset  . Mental illness Neg Hx    Family Psychiatric History: Per chart review denies any family hx of  Mental illness Tobacco Screening:   Social History:  Social History  Substance and Sexual Activity  Alcohol Use No     Social History   Substance and Sexual Activity  Drug Use Yes  . Types: "Crack" cocaine, Cocaine, Marijuana   Comment: 100-200 dollars per day    Additional Social History: Marital status:  Single    Pain Medications: see MAR Prescriptions: see MAR Over the Counter: see MAR History of alcohol / drug use?: Yes Longest period of sobriety (when/how long): 2.5 years in the past Name of Substance 1: cocaine 1 - Age of First Use: 20 1 - Amount (size/oz): $100 to 200 1 - Frequency: daily 1 - Duration: since onset 1 - Last Use / Amount: today      Allergies:  No Known Allergies Lab Results: No results found for this or any previous visit (from the past 48 hour(s)).  Blood Alcohol level:  Lab Results  Component Value Date   [facility] <10 01/30/2019   ETH <5 97/04/6376    Metabolic Disorder Labs:  Lab Results  Component Value Date   HGBA1C 5.6 03/26/2016   MPG 114 03/26/2016   Lab Results  Component Value Date   PROLACTIN 5.8 03/26/2016   Lab Results  Component Value Date   CHOL 151 03/26/2016   TRIG 149 03/26/2016   HDL 40 (L) 03/26/2016   CHOLHDL 3.8 03/26/2016   VLDL 30 03/26/2016   LDLCALC 81 03/26/2016    Current Medications: No current facility-administered medications for this encounter.   PTA Medications: Medications Prior to Admission  Medication Sig Dispense Refill Last Dose  . hydrOXYzine (ATARAX/VISTARIL) 50 MG tablet Take 1 tablet (50 mg total) by mouth every 6 (six) hours as needed for anxiety. (Patient not taking: Reported on 01/30/2019) 30 tablet 0   . lurasidone (LATUDA) 40 MG TABS tablet Take 1 tablet (40 mg total) by mouth daily with supper. (Patient not taking: Reported on 01/30/2019) 30 tablet 0   . sertraline (ZOLOFT) 25 MG tablet Take 3 tablets (75 mg total) by mouth daily. (Patient not taking: Reported on 01/30/2019) 30 tablet 0   . traZODone (DESYREL) 150 MG tablet Take 1 tablet (150 mg total) by mouth at bedtime as needed for sleep. (Patient not taking: Reported on 01/30/2019) 30 tablet 0     Musculoskeletal: Strength & Muscle Tone: within normal limits Gait & Station: normal Patient leans: N/A  Psychiatric Specialty  Exam: Physical Exam  Vitals reviewed. Constitutional: She is oriented to person, place, and time.  Neurological: She is alert and oriented to person, place, and time.    Review of Systems  There were no vitals taken for this visit.There is no height or weight on file to calculate BMI.  General Appearance: Fairly Groomed  Eye Contact:  Good  Speech:  Clear and Coherent and Normal Rate  Volume:  Normal  Mood:  Depressed, Hopeless and Worthless  Affect:  Depressed and Tearful  Thought Process:  Coherent, Linear and Descriptions of Associations: Intact  Orientation:  Full (Time, Place, and Person)  Thought Content:  Hallucinations: Auditory  Suicidal Thoughts:  She has no plan of suicide although she is unable to contract for safety at this time.  Homicidal Thoughts:  No  Memory:  Immediate;   Fair Recent;   Fair  Judgement:  Fair  Insight:  Fair  Psychomotor Activity:  Normal  Concentration:  Concentration: Fair and Attention Span: Fair  Recall:  [employer] of Knowledge:  Fair  Language:  Good  Akathisia:  Negative  Handed:  Right  AIMS (if indicated):     Assets:  Communication Skills Desire for Improvement Resilience  ADL's:  Intact  Cognition:  WNL  Sleep:         Treatment Plan Summary: Daily contact with patient to assess and evaluate symptoms and progress in treatment  Observation Level/Precautions:  15 minute checks Laboratory:  CBC Chemistry Profile HbAIC HCG UDS:   Estimated LOS: To be determined  Other:  Patient unable to contract for safety. I am recommending overnight observation for monitoring for safety and stability. We have already discussed the duration of stay. We discussed further plan which is to provide resoruces for substance abuse services, housing, and outpatient therapy. Denzil Magnuson, NP 2/2/20211:00 PM

## 2019-04-17 NOTE — BH Assessment (Signed)
Assessment Note  Amy Le is an 44 y.o. female who presented to [facility] seeking help for her cocaine problem.  Patient states, "I have a drug problem, I have been out there for 8-9 months now, homeless, no job, no stability and not mentally right."  Patient states, "I want help before I become suicidal."  Patient states that she was clean for two years prior to relapsing.  Patient states that she has been using $100-200 worth of cocaine daily.  Patient states that she has no local support and states that her children live with other family members.  Patient states that she has suicidal thoughts at times, but states that she currently has no plan or intent.  Patient states that she just feels hopeless, like her life is no good.  Patient states that she does not feel good about herself and states that she feels like she has been a disappointment to her family.  Patient continued to report that she has no stable place to live throughout the assessment.  She states that she has experienced suicidal thoughts in the past, but states that she has never acted on them.  When asked what triggered these thoughts in the past, patient stated, "I would rather not talk about it."  Patient denies HI, but states that she hears voices at times, but states that she is able to distinguish between the voices and she states that she knows when they are not real.  Patient states that she has only been sleeping 3 hours per night and states that she has not been eating well abn states that she has lost an undetermined amount of weight.  Patient presented as alert and oriented.  Her mood depressed and her affect was tearful.  She did not appear to be responding to any internal stimuli.  Her thoughts were organized and her memory intact.  Her psycho-motor activity was unremarkable.  Her judgment, insight and impulse control were partially impaired.  Her eye contact was good and her speech coherent.                                           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 Bipolar 1 disorder (HCC)   . BV (bacterial vaginosis)   . Chlamydia   . Trichomonas     Past Surgical History:  Procedure Laterality Date  . FOOT SURGERY    . MULTIPLE TOOTH EXTRACTIONS Bilateral 2017  . TUBAL LIGATION      Family History:  Family History  Problem Relation Age of Onset  . Mental illness Neg Hx     Social History:  reports that she has been smoking cigarettes. She has been smoking about 0.50 packs per day. She has never used smokeless tobacco. She reports current drug use. Drugs: "Crack" cocaine, Cocaine, and Marijuana. She reports that she does not drink alcohol.  Additional Social History:  Alcohol / Drug Use Pain Medications: see MAR Prescriptions: see MAR Over the Counter: see MAR History of alcohol / drug use?: Yes Longest period of sobriety (when/how long): 2.5 years in the past Substance #1 Name of Substance 1: cocaine 1 - Age of First Use: 20 1 - Amount (size/oz): $100 to 200 1 - Frequency: daily 1 - Duration: since onset 1 - Last  Use / Amount: today  CIWA:   COWS:    Allergies: No Known Allergies  Home Medications: (Not in a hospital admission)   OB/GYN Status:  No LMP recorded.  General Assessment Data Location of Assessment: [facility] Assessment Services TTS Assessment: In system Is this a Tele or Face-to-Face Assessment?: Face-to-Face Is this an Initial Assessment or a Re-assessment for this encounter?: Initial Assessment Patient Accompanied by:: N/A Language Other than English: No Living Arrangements: Homeless/Shelter What gender do you identify as?: Female Marital status: Single Pregnancy Status: No Living Arrangements: Alone Can pt return to current living arrangement?: Yes Admission Status: Voluntary Is patient capable of signing voluntary admission?: No Referral Source:  Self/Family/Friend Insurance type: self-pay  Medical Screening Exam ([place]) Medical Exam completed: Yes  Crisis Care Plan Living Arrangements: Alone Legal Guardian: Other:(self) Name of Psychiatrist: none Name of Therapist: none  Education Status Is patient currently in school?: No Is the patient employed, unemployed or receiving disability?: Unemployed  Risk to self with the past 6 months Suicidal Ideation: No Has patient been a risk to self within the past 6 months prior to admission? : No Suicidal Intent: No Has patient had any suicidal intent within the past 6 months prior to admission? : No Is patient at risk for suicide?: Yes Suicidal Plan?: No-Not Currently/Within Last 6 Months Has patient had any suicidal plan within the past 6 months prior to admission? : No Access to Means: No What has been your use of drugs/alcohol within the last 12 months?: (daily use) Previous Attempts/Gestures: No(thoughts only) How many times?: 0 Other Self Harm Risks: drug issues Triggers for Past Attempts: None known Intentional Self Injurious Behavior: None Family Suicide History: No Recent stressful life event(s): Financial Problems(homeless) Persecutory voices/beliefs?: No Depression: Yes Depression Symptoms: Tearfulness, Isolating, Loss of interest in usual pleasures, Feeling worthless/self pity Substance abuse history and/or treatment for substance abuse?: Yes Suicide prevention information given to non-admitted patients: Not applicable  Risk to Others within the past 6 months Homicidal Ideation: No Does patient have any lifetime risk of violence toward others beyond the six months prior to admission? : No Thoughts of Harm to Others: No Current Homicidal Intent: No Current Homicidal Plan: No Access to Homicidal Means: No Identified Victim: none History of harm to others?: No Assessment of Violence: None Noted Violent Behavior Description: (none) Does patient have  access to weapons?: No Criminal Charges Pending?: No Does patient have a court date: No Is patient on probation?: No  Psychosis Hallucinations: Auditory Delusions: None noted  Mental Status Report Appearance/Hygiene: Unremarkable Eye Contact: Good Motor Activity: Freedom of movement Speech: Logical/coherent Level of Consciousness: Alert Mood: Depressed Affect: Depressed Anxiety Level: Moderate Thought Processes: Coherent, Relevant Judgement: Partial Orientation: Person, Place, Time, Situation Obsessive Compulsive Thoughts/Behaviors: Severe(drug use)  Cognitive Functioning Concentration: Normal Memory: Recent Intact, Remote Intact Is patient IDD: No Insight: Poor Impulse Control: Poor Appetite: Poor Have you had any weight changes? : Loss Amount of the weight change? (lbs): (unsure) Sleep: Decreased Total Hours of Sleep: 3 Vegetative Symptoms: None  ADLScreening [facility] Assessment Services) Patient's cognitive ability adequate to safely complete daily activities?: Yes Patient able to express need for assistance with ADLs?: Yes Independently performs ADLs?: Yes (appropriate for developmental age)  Prior Inpatient Therapy Prior Inpatient Therapy: Yes Prior Therapy Dates: 2019 Prior Therapy Facilty/Provider(s): [facility] Reason for Treatment: depression/SA  Prior Outpatient Therapy Prior Outpatient Therapy: No Does patient have an ACCT team?: No Does patient have Intensive In-House Services?  : No  Does patient have [facility] services? : No Does patient have P4CC services?: No  ADL Screening (condition at time of admission) Patient's cognitive ability adequate to safely complete daily activities?: Yes Is the patient deaf or have difficulty hearing?: No Does the patient have difficulty seeing, even when wearing glasses/contacts?: No Does the patient have difficulty concentrating, remembering, or making decisions?: No Patient able to express need for assistance with ADLs?:  Yes Does the patient have difficulty dressing or bathing?: No Independently performs ADLs?: Yes (appropriate for developmental age) Does the patient have difficulty walking or climbing stairs?: No Weakness of Legs: None Weakness of Arms/Hands: None  Home Assistive Devices/Equipment Home Assistive Devices/Equipment: None  Therapy Consults (therapy consults require a physician order) PT Evaluation Needed: No OT Evalulation Needed: No SLP Evaluation Needed: No Abuse/Neglect Assessment (Assessment to be complete while patient is alone) Abuse/Neglect Assessment Can Be Completed: Yes Physical Abuse: Denies Verbal Abuse: Denies Sexual Abuse: Denies Exploitation of patient/patient's resources: Denies Self-Neglect: Denies Values / Beliefs Cultural Requests During Hospitalization: None Spiritual Requests During Hospitalization: None Consults Spiritual Care Consult Needed: No Transition of Care Team Consult Needed: No Advance Directives (For Healthcare) Does Patient Have a Medical Advance Directive?: No Would patient like information on creating a medical advance directive?: No - Patient declined Nutrition Screen- [facility] Adult/WL/AP Has the patient recently lost weight without trying?: Patient is unsure Has the patient been eating poorly because of a decreased appetite?: Yes Malnutrition Screening Tool Score: 3        Disposition: Per Denzil Magnuson, NP, Patient will be monitored and observed overnight for safety and will be re-assessed in the morning. Disposition Initial Assessment Completed for this Encounter: Yes Disposition of Patient: (Overnight OBS)  On Site Evaluation by:   Reviewed with Physician:    Amy Le 04/17/2019 11:48 AM

## 2019-04-17 NOTE — Progress Notes (Signed)
Patient ID: Amy Le, female   DOB: 1975-07-04, 44 y.o.   MRN: [id number] DAR Note: Pt calm and cooperative, endorsing +SI with no plan and verbally contracting for safety on the unit.  Pt reports +AH of non specific voices and no VH/HI, and is currently being monitored via Q15 minute safety checks.  Meds will be given as ordered.

## 2019-04-17 NOTE — Progress Notes (Signed)
Pt is a black female of 43 years, presents Voluntary SI without plan, denies HI and AVH.  Pt states she has used multiple drugs with alcohol over the last few days. Positive for  Cocaine and Marinol. Pt has been homeless for the last 9 months. Family local with no interactions.      Patient sluggish  in a calm mood, appearance unkept,  on assessment and stating that she does not sleep well . Appetite reported as "good" and energy level reported "low". Patient endorsed depression at 7 of 10, anxiety at 7 of 10 and agitation at 8  of 10. Pt states her problems stem from drug use.Pain to both feet have numbness  at 7 of 10.   Skin:multiple areas of scar tissue over body, no erythema in any areas. Large tatto on leftt lower leg.  Vital signs monitored. LBM 04/15/19. Pt orientated to unit and room. Meal accepted and eaten.  Support and encouragement provided.  Routine safety checks conducted every 15 minutes.  Patient agreed to notify staff with problems or concerns.    No signs of distess observed. Patient compliant with  treatment plan. Patient receptive, calm, and cooperative. Patient interacts well with others on the unit.  Patient remains safe at this time.       Amy Le. Amy Neth MSN, RN, [facility] [phone]

## 2019-04-17 NOTE — H&P (Signed)
Behavioral Health Medical Screening Exam  Amy Le is an 44 y.o. female.who presents to [facility], voluntarily. Patient endorses depression with symptoms described as feelings of hopelessness, worthlessness, anhedonia, irritability. Decreased sleep, poor appetite. She endorses intermittent  thoughts of suicide and when asked if she felt safe leaving the hospital today she replied," I know that I cant keep myself safe." She reports a history of multiple psychiatric hospitalizations  per chart review, patient has been admitted to [facility] at least 4 times although in the distant past. Her psychiatric diagnoses include polysubstance abuse,MDD, and PTSD. She admits that she has been using cocaine, daily for the past 7-8 months along with mariajuana and occasional alcohol use. She is tearful during this evaluation and states," I just want help for my depression and substance abuse. If not, I know I will end up dead." She endorses auditory hallucinations described as,"hearing voices degrading me." She denies other hallucinations or psychosis .She denies homicidal ideations. She denies access to weapons.  She reports she is not currently on any psychiatric medications and does not have outpatient psychiatric services.She identifies another stressor as not having a stable place to stay. She reports she has been living off and on with associated and on the streets. She denies a plan to harm herself although she is unable to contract for safety.    Total Time spent with patient: 20 minutes  Psychiatric Specialty Exam: Physical Exam  Vitals reviewed. Constitutional: She is oriented to person, place, and time.  Neurological: She is alert and oriented to person, place, and time.    Review of Systems  Psychiatric/Behavioral: Positive for hallucinations.       Depression,     There were no vitals taken for this visit.There is no height or weight on file to calculate BMI.  General Appearance: Fairly Groomed  Eye  Contact:  Good  Speech:  Clear and Coherent and Normal Rate  Volume:  Normal  Mood:  Depressed, Hopeless and Worthless  Affect:  Depressed and Tearful  Thought Process:  Coherent, Goal Directed, Linear and Descriptions of Associations: Intact  Orientation:  Full (Time, Place, and Person)  Thought Content:  Hallucinations: Auditory  Suicidal Thoughts: She has no plan of suicide although she is unable to contract for safety at this time.  Homicidal Thoughts:  No  Memory:  Immediate;   Fair Recent;   Fair  Judgement:  Fair  Insight:  Fair  Psychomotor Activity:  Normal  Concentration: Concentration: Fair and Attention Span: Fair  Recall:  [employer] of Knowledge:Fair  Language: Good  Akathisia:  Negative  Handed:  Right  AIMS (if indicated):     Assets:  Communication Skills Desire for Improvement Resilience  Sleep:       Musculoskeletal: Strength & Muscle Tone: within normal limits Gait & Station: normal Patient leans: N/A  There were no vitals taken for this visit.  Recommendations:  Based on my evaluation the patient does not appear to have an emergency medical condition.  Patient unable to contract for safety. I am recommending overnight observation for monitoring for safety and stability. We have already discussed the duration of stay. We discussed further plan which is to provide resoruces for substance abuse services, housing, and outpatient therapy. Patient to be admitted to the observation unit pending negative COVID.   Denzil Magnuson, NP 04/17/2019, 11:17 AM

## 2019-04-18 ENCOUNTER — Ambulatory Visit (HOSPITAL_COMMUNITY): Payer: Self-pay

## 2019-04-18 DIAGNOSIS — F1410 Cocaine abuse, uncomplicated: Secondary | ICD-10-CM

## 2019-04-18 LAB — URINALYSIS, ROUTINE W REFLEX MICROSCOPIC
Bilirubin Urine: NEGATIVE
Glucose, UA: NEGATIVE mg/dL
Ketones, ur: NEGATIVE mg/dL
Nitrite: NEGATIVE
Protein, ur: 30 mg/dL — AB
Specific Gravity, Urine: 1.01 (ref 1.005–1.030)
WBC, UA: >50 WBC/hpf — ABNORMAL HIGH (ref 0–5)
pH: 7 (ref 5.0–8.0)

## 2019-04-18 LAB — RAPID URINE DRUG SCREEN, HOSP PERFORMED
Amphetamines: NOT DETECTED
Barbiturates: NOT DETECTED
Benzodiazepines: NOT DETECTED
Cocaine: POSITIVE — AB
Opiates: NOT DETECTED
Tetrahydrocannabinol: POSITIVE — AB

## 2019-04-18 LAB — COMPREHENSIVE METABOLIC PANEL
ALT: 11 U/L (ref 0–44)
AST: 15 U/L (ref 15–41)
Albumin: 3.9 g/dL (ref 3.5–5.0)
Alkaline Phosphatase: 69 U/L (ref 38–126)
Anion gap: 9 (ref 5–15)
BUN: 8 mg/dL (ref 6–20)
CO2: 23 mmol/L (ref 22–32)
Calcium: 9.3 mg/dL (ref 8.9–10.3)
Chloride: 108 mmol/L (ref 98–111)
Creatinine, Ser: 0.88 mg/dL (ref 0.44–1.00)
GFR calc Af Amer: >60 mL/min (ref >60)
GFR calc non Af Amer: >60 mL/min (ref >60)
Glucose, Bld: 114 mg/dL — ABNORMAL HIGH (ref 70–99)
Potassium: 4 mmol/L (ref 3.5–5.1)
Sodium: 140 mmol/L (ref 135–145)
Total Bilirubin: 0.3 mg/dL (ref 0.3–1.2)
Total Protein: 7.5 g/dL (ref 6.5–8.1)

## 2019-04-18 LAB — CBC
HCT: 46.2 % — ABNORMAL HIGH (ref 36.0–46.0)
Hemoglobin: 15 g/dL (ref 12.0–15.0)
MCH: 28.7 pg (ref 26.0–34.0)
MCHC: 32.5 g/dL (ref 30.0–36.0)
MCV: 88.3 fL (ref 80.0–100.0)
Platelets: 268 10*3/uL (ref 150–400)
RBC: 5.23 MIL/uL — ABNORMAL HIGH (ref 3.87–5.11)
RDW: 14.4 % (ref 11.5–15.5)
WBC: 5.9 10*3/uL (ref 4.0–10.5)
nRBC: 0 % (ref 0.0–0.2)

## 2019-04-18 LAB — HEMOGLOBIN A1C
Hgb A1c MFr Bld: 5.7 % — ABNORMAL HIGH (ref 4.8–5.6)
Mean Plasma Glucose: 116.89 mg/dL

## 2019-04-18 LAB — ETHANOL: Alcohol, Ethyl (B): <10 mg/dL (ref <10)

## 2019-04-18 LAB — LIPID PANEL
Cholesterol: 168 mg/dL (ref 0–200)
HDL: 52 mg/dL (ref >40)
LDL Cholesterol: 99 mg/dL (ref 0–99)
Total CHOL/HDL Ratio: 3.2 RATIO
Triglycerides: 87 mg/dL (ref <150)
VLDL: 17 mg/dL (ref 0–40)

## 2019-04-18 LAB — PREGNANCY, URINE: Preg Test, Ur: NEGATIVE

## 2019-04-18 LAB — TSH: TSH: 0.427 u[IU]/mL (ref 0.350–4.500)

## 2019-04-18 NOTE — BH Assessment (Signed)
[facility] Assessment Progress Note  Per Malvin Johns, MD, this pt does not require psychiatric hospitalization at this time.  Pt is to be discharged from the [facility] Observation Unit with referral information for area substance abuse treatment providers.  This has been included in pt's discharge instructions, along with information regarding area supportive services for the homeless.  Pt would also benefit from seeing Peer Support Specialists and a peer support consult has been ordered for pt.  Pt's nurse, Erin Fulling, has been notified.  Doylene Canning, MA Triage Specialist [phone]

## 2019-04-18 NOTE — Progress Notes (Signed)
  Pt is black  female of 43 years, presents Voluntary, SI without a plan..    Patient reports air appetite, low energy, and fair sleeping pattern. Pt endorses  depression 5 out of 10, hopelessness 6 out of 10, and anxiety at 7 out of 10. Pt reports SI sometimes without  plans today, denies  HI or AVH.  Pt reports last BM today, Pt reports  pain at a 4 out of 10.Pain is located in both legs. Explained to pt what was ordered and that pain meds would need to be requested from doctor. She was unhappy and requested another nurses opinion.  Vitals signs WNL and being monitored.   safety maintained with q15 minute checks.  Einar Crow. Melvyn Neth MSN, RN, [facility] [phone]

## 2019-04-18 NOTE — Discharge Summary (Signed)
Physician Discharge Summary Note  Patient:  Amy Le is an 44 y.o., female MRN:  [id number] DOB:  1975-10-22 Patient phone:  [phone] (home)  Patient address:   Homeless In [place] [address],  Total Time spent with patient: 45 minutes  Date of Admission:  04/17/2019 Date of Discharge: 04/18/2019  Reason for Admission:   Amy Le is an 44 y.o. female who presented to [facility] seeking help for her cocaine problem.  Patient states, "I have a drug problem, I have been out there for 8-9 months now, homeless, no job, no stability and not mentally right."  Patient states, "I want help before I become suicidal."  Patient states that she was clean for two years prior to relapsing.  Patient states that she has been using $100-200 worth of cocaine daily.  Patient states that she has no local support and states that her children live with other family members.  Patient states that she has suicidal thoughts at times, but states that she currently has no plan or intent.  Patient states that she just feels hopeless, like her life is no good.  Patient states that she does not feel good about herself and states that she feels like she has been a disappointment to her family.  Patient continued to report that she has no stable place to live throughout the assessment.  She states that she has experienced suicidal thoughts in the past, but states that she has never acted on them.  When asked what triggered these thoughts in the past, patient stated, "I would rather not talk about it."  Patient denies HI, but states that she hears voices at times, but states that she is able to distinguish between the voices and she states that she knows when they are not real.  Patient states that she has only been sleeping 3 hours per night and states that she has not been eating well abn states that she has lost an undetermined amount of weight.  Patient presented as alert and oriented.  Her mood depressed  and her affect was tearful.  She did not appear to be responding to any internal stimuli.  Her thoughts were organized and her memory intact.  Her psycho-motor activity was unremarkable.  Her judgment, insight and impulse control were partially impaired.  Her eye contact was good and her speech coherent.                    Principal Problem: <principal problem not specified> Discharge Diagnoses: Active Problems:   MDD (major depressive disorder)   Cocaine use disorder, mild, abuse (HCC)   Past Psychiatric History: see above  Past Medical History:  Past Medical History:  Diagnosis Date  . Bipolar 1 disorder (HCC)   . BV (bacterial vaginosis)   . Chlamydia   . Trichomonas     Past Surgical History:  Procedure Laterality Date  . FOOT SURGERY    . MULTIPLE TOOTH EXTRACTIONS Bilateral 2017  . TUBAL LIGATION     Family History:  Family History  Problem Relation Age of Onset  . Mental illness Neg Hx    Family Psychiatric  History: see above Social History:  Social History   Substance and Sexual Activity  Alcohol Use No     Social History   Substance and Sexual Activity  Drug Use Yes  . Types: "Crack" cocaine, Cocaine, Marijuana   Comment: 100-200 dollars per day    Social History   Socioeconomic History  . Marital  status: Single    Spouse name: Not on file  . Number of children: Not on file  . Years of education: Not on file  . Highest education level: Not on file  Occupational History  . Not on file  Tobacco Use  . Smoking status: Current Every Day Smoker    Packs/day: 0.50    Types: Cigarettes  . Smokeless tobacco: Never Used  . Tobacco comment: Refused  Substance and Sexual Activity  . Alcohol use: No  . Drug use: Yes    Types: "Crack" cocaine, Cocaine, Marijuana    Comment: 100-200 dollars per day  . Sexual activity: Yes    Birth control/protection: Surgical  Other Topics Concern  . Not on file  Social History Narrative  . Not on file   Social  Determinants of Health   Financial Resource Strain:   . Difficulty of Paying Living Expenses: Not on file  Food Insecurity:   . Worried About Programme researcher, broadcasting/film/video in the Last Year: Not on file  . Ran Out of Food in the Last Year: Not on file  Transportation Needs:   . Lack of Transportation (Medical): Not on file  . Lack of Transportation (Non-Medical): Not on file  Physical Activity:   . Days of Exercise per Week: Not on file  . Minutes of Exercise per Session: Not on file  Stress:   . Feeling of Stress : Not on file  Social Connections:   . Frequency of Communication with Friends and Family: Not on file  . Frequency of Social Gatherings with Friends and Family: Not on file  . Attends Religious Services: Not on file  . Active Member of Clubs or Organizations: Not on file  . Attends Banker Meetings: Not on file  . Marital Status: Not on file    Hospital Course:    Patient was was admitted to the observation unit, she displayed no dangerous behaviors here and had no cocaine cravings by the morning of the third.  She focused on housing stated she simply had nowhere to go and hope that we could help her find housing.  She denied auditory or visual hallucinations, denied wanting to harm self by my evaluation as long as we would help her find some type of housing.  Denied wanting medications for cravings when offered  Musculoskeletal: Strength & Muscle Tone: within normal limits Gait & Station: normal Patient leans: N/A  Psychiatric Specialty Exam: Physical Exam  Nursing note and vitals reviewed. Constitutional: She appears well-developed and well-nourished.    Review of Systems  Constitutional: Negative.   Eyes: Negative.   Respiratory: Negative.   Gastrointestinal: Negative.   Endocrine: Negative.   Genitourinary: Negative.   Musculoskeletal: Negative.   Neurological: Negative.   Hematological: Negative.     Blood pressure 132/69, pulse 88, temperature 98.9  F (37.2 C), temperature source Oral, resp. rate 16, height 5\' 6"  (1.676 m), weight 87.5 kg, SpO2 100 %.Body mass index is 31.15 kg/m.  General Appearance: Casual  Eye Contact:  Good  Speech:  Clear and Coherent  Volume:  Normal  Mood:  Dysphoric  Affect:  Congruent  Thought Process:  Goal Directed  Orientation:  Full (Time, Place, and Person)  Thought Content:  Logical and Denies current auditory or visual hallucinations  Suicidal Thoughts:  Denies current suicidal thoughts plans or intent as long as we can find her a place to stay, elaborating that this propels her desire not to live because she is  simply homeless.  If she has a place to go she will contract fully for safety  Homicidal Thoughts:  No  Memory:  Immediate;   Fair Recent;   Fair Remote;   Fair  Judgement:  Fair  Insight:  Fair  Psychomotor Activity:  Normal  Concentration:  Concentration: Fair and Attention Span: Fair  Recall:  [employer] of Knowledge:  Fair  Language:  Fair  Akathisia:  Negative  Handed:  Right  AIMS (if indicated):     Assets:  Leisure Time Physical Health Resilience  ADL's:  Intact  Cognition:  WNL  Sleep:           Has this patient used any form of tobacco in the last 30 days? (Cigarettes, Smokeless Tobacco, Cigars, and/or Pipes) Yes, No  Blood Alcohol level:  Lab Results  Component Value Date   ETH <10 04/18/2019   ETH <10 16/09/3708    Metabolic Disorder Labs:  Lab Results  Component Value Date   HGBA1C 5.7 (H) 04/18/2019   MPG 116.89 04/18/2019   MPG 114 03/26/2016   Lab Results  Component Value Date   PROLACTIN 5.8 03/26/2016   Lab Results  Component Value Date   CHOL 168 04/18/2019   TRIG 87 04/18/2019   HDL 52 04/18/2019   CHOLHDL 3.2 04/18/2019   VLDL 17 04/18/2019   LDLCALC 99 04/18/2019   LDLCALC 81 03/26/2016    See Psychiatric Specialty Exam and Suicide Risk Assessment completed by Attending Physician prior to discharge.  Discharge destination:   Home  Is patient on multiple antipsychotic therapies at discharge:  No   Has Patient had three or more failed trials of antipsychotic monotherapy by history:  No  Recommended Plan for Multiple Antipsychotic Therapies: NA   Allergies as of 04/18/2019   No Known Allergies     Medication List    You have not been prescribed any medications.      Follow-up recommendations:  Other:  Can stay to a reasonable degree of medical certainty that her complaints of self-harm and thoughts of suicide are propelled by her desire to find housing, she directly links these to and states that if we find her housing release give her resources she will contract fully, therefore since there is no inpatient detox for cocaine, and her suicidality is more manipulative than genuine, we will find her resources.    SignedJohnn Hai, MD 04/18/2019, 11:53 AM

## 2019-04-18 NOTE — Patient Outreach (Signed)
CPSS met with Pt an was able to complete series of questions. CPSS used motivational interviewing to gain an understanding of what services Pt are seeking. CPSS discussed a few options that maybe suitable for what Pt is seeking. CPSS contacted [facility], which Pt was able to speak with someone in Intake about attending. CPSS is making sure that Pt does have the Photo Identification needed as well as transportation needed to get to facility.

## 2019-04-18 NOTE — Progress Notes (Signed)
NOVEL CORONAVIRUS (COVID-19) DAILY CHECK-OFF SYMPTOMS - answer yes or no to each - every day NO YES  Have you had a fever in the past 24 hours?  . Fever (Temp > 37.80C / 100F) X   Have you had any of these symptoms in the past 24 hours? . New Cough .  Sore Throat  .  Shortness of Breath .  Difficulty Breathing .  Unexplained Body Aches   X   Have you had any one of these symptoms in the past 24 hours not related to allergies?   . Runny Nose .  Nasal Congestion .  Sneezing   X   If you have had runny nose, nasal congestion, sneezing in the past 24 hours, has it worsened?  X   EXPOSURES - check yes or no X   Have you traveled outside the state in the past 14 days?  X   Have you been in contact with someone with a confirmed diagnosis of COVID-19 or PUI in the past 14 days without wearing appropriate PPE?  X   Have you been living in the same home as a person with confirmed diagnosis of COVID-19 or a PUI (household contact)?    X   Have you been diagnosed with COVID-19?    X              What to do next: Answered NO to all: Answered YES to anything:   Proceed with unit schedule Follow the [facility] Inpatient Flowsheet.   Brown Dunlap K. Katera Rybka MSN, RN, [facility] [phone] 

## 2019-04-18 NOTE — Discharge Instructions (Signed)
To help you maintain a sober lifestyle, a substance abuse treatment program may be beneficial to you.  Contact one of the following facilities at your earliest opportunity to ask about enrolling:  RESIDENTIAL PROGRAMS:       [facility]      [address] [place], [place] [address]      [phone]       [facility]      [address] [place], [place] [address]      [phone]  OUTPATIENT PROGRAMS:       [facility]      [address]      [place], [place] [address]      [phone]      New patients are seen at their walk-in clinic.  Walk-in hours are Monday - Friday from 8:30 am - 12:00 pm, and from 1:00 pm - 2:30 pm.  Walk-in patients are seen on a first come, first served basis, so try to arrive as early as possible for the best chance of being seen the same day.   For your shelter needs, you are advised to contact [facility] at your earliest opportunity:       [facility]      [phone]  For other supportive services, contact the [facility]:       [facility]      [address] [place], [place] [address]      [phone]

## 2019-04-18 NOTE — Progress Notes (Signed)
Discharge:   Pt is alert and oriented X 4. Pt given AVS packet to include medication information update, future appointment information, and other resource information. Pt given all belongings and signature obtained. Pt discharged to shelter. Transportation provided shelter.     Einar Crow. Melvyn Neth MSN, RN, [facility] [phone]

## 2019-04-18 NOTE — Progress Notes (Signed)
CSW arranged transportation for the patient to [facility]. Safe Transport scheduled for 2:45pm.   Drucilla Schmidt, MSW, LCSW-A Clinical Disposition Social Worker [facility]/TTS [phone]

## 2019-04-19 LAB — PROLACTIN: Prolactin: 86.4 ng/mL — ABNORMAL HIGH (ref 4.8–23.3)

## 2019-04-20 LAB — GC/CHLAMYDIA PROBE AMP (~~LOC~~) NOT AT ARMC
Chlamydia: NEGATIVE
Comment: NEGATIVE
Comment: NORMAL
Neisseria Gonorrhea: NEGATIVE

## 2019-05-25 ENCOUNTER — Emergency Department (HOSPITAL_COMMUNITY)
Admission: EM | Admit: 2019-05-25 | Discharge: 2019-05-27 | Disposition: A | Discharge status: Home / Self Care | Payer: Medicaid Other | Attending: Emergency Medicine | Admitting: Emergency Medicine

## 2019-05-25 DIAGNOSIS — F319 Bipolar disorder, unspecified: Secondary | ICD-10-CM | POA: Insufficient documentation

## 2019-05-25 DIAGNOSIS — F1994 Other psychoactive substance use, unspecified with psychoactive substance-induced mood disorder: Secondary | ICD-10-CM

## 2019-05-25 DIAGNOSIS — F17210 Nicotine dependence, cigarettes, uncomplicated: Secondary | ICD-10-CM | POA: Insufficient documentation

## 2019-05-25 DIAGNOSIS — R45851 Suicidal ideations: Secondary | ICD-10-CM | POA: Insufficient documentation

## 2019-05-25 DIAGNOSIS — F1910 Other psychoactive substance abuse, uncomplicated: Secondary | ICD-10-CM | POA: Insufficient documentation

## 2019-05-25 DIAGNOSIS — Z20822 Contact with and (suspected) exposure to covid-19: Secondary | ICD-10-CM | POA: Insufficient documentation

## 2019-05-25 DIAGNOSIS — F14251 Cocaine dependence with cocaine-induced psychotic disorder with hallucinations: Secondary | ICD-10-CM | POA: Diagnosis present

## 2019-05-25 LAB — CBC
HCT: 41.1 % (ref 36.0–46.0)
Hemoglobin: 13.1 g/dL (ref 12.0–15.0)
MCH: 28.5 pg (ref 26.0–34.0)
MCHC: 31.9 g/dL (ref 30.0–36.0)
MCV: 89.5 fL (ref 80.0–100.0)
Platelets: 211 10*3/uL (ref 150–400)
RBC: 4.59 MIL/uL (ref 3.87–5.11)
RDW: 14.2 % (ref 11.5–15.5)
WBC: 6 10*3/uL (ref 4.0–10.5)
nRBC: 0 % (ref 0.0–0.2)

## 2019-05-25 LAB — COMPREHENSIVE METABOLIC PANEL
ALT: 11 U/L (ref 0–44)
AST: 18 U/L (ref 15–41)
Albumin: 4.2 g/dL (ref 3.5–5.0)
Alkaline Phosphatase: 54 U/L (ref 38–126)
Anion gap: 8 (ref 5–15)
BUN: 10 mg/dL (ref 6–20)
CO2: 25 mmol/L (ref 22–32)
Calcium: 9 mg/dL (ref 8.9–10.3)
Chloride: 106 mmol/L (ref 98–111)
Creatinine, Ser: 0.76 mg/dL (ref 0.44–1.00)
GFR calc Af Amer: >60 mL/min (ref >60)
GFR calc non Af Amer: >60 mL/min (ref >60)
Glucose, Bld: 85 mg/dL (ref 70–99)
Potassium: 3.1 mmol/L — ABNORMAL LOW (ref 3.5–5.1)
Sodium: 139 mmol/L (ref 135–145)
Total Bilirubin: 0.7 mg/dL (ref 0.3–1.2)
Total Protein: 7.3 g/dL (ref 6.5–8.1)

## 2019-05-25 LAB — RAPID URINE DRUG SCREEN, HOSP PERFORMED
Amphetamines: NOT DETECTED
Barbiturates: NOT DETECTED
Benzodiazepines: NOT DETECTED
Cocaine: POSITIVE — AB
Opiates: NOT DETECTED
Tetrahydrocannabinol: POSITIVE — AB

## 2019-05-25 LAB — ETHANOL: Alcohol, Ethyl (B): <10 mg/dL (ref <10)

## 2019-05-25 LAB — RESPIRATORY PANEL BY RT PCR (FLU A&B, COVID)
Influenza A by PCR: NEGATIVE
Influenza B by PCR: NEGATIVE
SARS Coronavirus 2 by RT PCR: NEGATIVE

## 2019-05-25 LAB — SALICYLATE LEVEL: Salicylate Lvl: <7 mg/dL — ABNORMAL LOW (ref 7.0–30.0)

## 2019-05-25 LAB — ACETAMINOPHEN LEVEL: Acetaminophen (Tylenol), Serum: <10 ug/mL — ABNORMAL LOW (ref 10–30)

## 2019-05-25 LAB — I-STAT BETA HCG BLOOD, ED (MC, WL, AP ONLY): I-stat hCG, quantitative: <5 m[IU]/mL (ref <5)

## 2019-05-25 MED ORDER — LORAZEPAM 2 MG/ML IJ SOLN: 0.0000 mg | Freq: Four times a day (QID) | INTRAMUSCULAR | Status: DC

## 2019-05-25 MED ORDER — LORAZEPAM 1 MG PO TABS: 0.0000 mg | ORAL_TABLET | Freq: Two times a day (BID) | ORAL | Status: DC

## 2019-05-25 MED ORDER — LORAZEPAM 2 MG/ML IJ SOLN: 0.0000 mg | Freq: Two times a day (BID) | INTRAMUSCULAR | Status: DC

## 2019-05-25 MED ORDER — LORAZEPAM 1 MG PO TABS
0.0000 mg | ORAL_TABLET | Freq: Four times a day (QID) | ORAL | Status: DC
  Administered 2019-05-25: 2 mg via ORAL
  Filled 2019-05-25: qty 2

## 2019-05-25 MED ORDER — ACETAMINOPHEN 325 MG PO TABS: 650.0000 mg | ORAL_TABLET | ORAL | Status: DC | PRN

## 2019-05-25 MED ORDER — POTASSIUM CHLORIDE CRYS ER 20 MEQ PO TBCR
40.0000 meq | EXTENDED_RELEASE_TABLET | Freq: Once | ORAL | Status: AC
  Administered 2019-05-25: 40 meq via ORAL
  Filled 2019-05-25: qty 2

## 2019-05-25 MED ORDER — THIAMINE HCL 100 MG/ML IJ SOLN: 100.0000 mg | Freq: Every day | INTRAMUSCULAR | Status: DC

## 2019-05-25 MED ORDER — THIAMINE HCL 100 MG PO TABS
100.0000 mg | ORAL_TABLET | Freq: Every day | ORAL | Status: DC
  Filled 2019-05-25: qty 1

## 2019-05-25 NOTE — ED Provider Notes (Signed)
[facility]-EMERGENCY DEPT Provider Note   CSN: [id number] Arrival date & time: 05/25/19  0825     History Chief Complaint  Patient presents with  . Suicidal    Amy Le is a 44 y.o. female with a past medical history significant for bipolar 1 disorder, cocaine use disorder, major depressive disorder, PTSD, and cannabis use disorder who presents to the ED via [facility] due to suicidal ideations x2 days.  Patient states that if she was around a knife she would cut herself or if she was around a gun she would shoot herself to "end things".  Patient notes she was just released from jail just prior to arrival in which she was there for roughly an hour.  She admits to homicidal ideations, but notes she has no thoughts of hurting anyone here in the hospital.  She also admits to auditory and visual hallucinations.  She admits to seeing angels and hearing voices about her family, bad things she has done, and voices telling her to end things.  She endorses smoking crack cocaine earlier this morning, but denies other drug use.  Denies alcohol use.  Chart reviewed.  Patient was admitted on 2/2-2/3 at [facility] for cocaine abuse.  She is not currently on any medication for her mental health disorders. She notes she stopped them roughly 9 months ago because she "self medicates herself". She admits to attempting suicide in the past by cutting her wrist. She endorses bilateral burning of her feet, but denies injury.  Denies history of diabetes. Denies all other physical complaints.  History obtained from patient and past medical records. No interpreter used during encounter.      Past Medical History:  Diagnosis Date  . Bipolar 1 disorder (HCC)   . BV (bacterial vaginosis)   . Chlamydia   . Trichomonas     Patient Active Problem List   Diagnosis Date Noted  . Cocaine use disorder, mild, abuse (HCC)   . MDD (major depressive disorder) 04/17/2019  . Cocaine use disorder, severe,  dependence (HCC) 07/20/2016  . MDD (major depressive disorder), recurrent, severe, with psychosis (HCC) 07/20/2016  . Cannabis use disorder, moderate, dependence (HCC) 07/20/2016  . Severe episode of recurrent major depressive disorder, without psychotic features (HCC)   . PTSD (post-traumatic stress disorder) 03/25/2016  . 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 FOOT SURGERY    . MULTIPLE TOOTH EXTRACTIONS Bilateral 2017  . TUBAL LIGATION       OB History   No obstetric history on file.     Family History  Problem Relation Age of Onset  . Mental illness Neg Hx     Social History   Tobacco Use  . Smoking status: Current Every Day Smoker    Packs/day: 0.50    Types: Cigarettes  . Smokeless tobacco: Never Used  . Tobacco comment: Refused  Substance Use Topics  . Alcohol use: No  . Drug use: Yes    Types: "Crack" cocaine, Cocaine, Marijuana    Comment: 100-200 dollars per day    Home Medications Prior to Admission medications   Not on File    Allergies    Patient has no known allergies.  Review of Systems   Review of Systems  Constitutional: Negative for chills and fever.  Respiratory: Negative for cough and shortness of breath.   Cardiovascular: Negative for chest pain and leg swelling.  Gastrointestinal: Negative  for abdominal pain, diarrhea, nausea and vomiting.  Genitourinary: Negative for dysuria.  Neurological: Negative for headaches.  Psychiatric/Behavioral: Positive for behavioral problems, hallucinations and suicidal ideas.  All other systems reviewed and are negative.   Physical Exam Updated Vital Signs BP (!) 131/101   Pulse 82   Temp 97.9 F (36.6 C) (Oral)   Resp 17   Ht 5\' 6"  (1.676 m)   Wt 59 kg   SpO2 100%   BMI 20.98 kg/m   Physical Exam Vitals and nursing note reviewed.  Constitutional:      General: She is not in  acute distress.    Appearance: She is not ill-appearing.  HENT:     Head: Normocephalic.  Eyes:     Pupils: Pupils are equal, round, and reactive to light.  Cardiovascular:     Rate and Rhythm: Normal rate and regular rhythm.     Pulses: Normal pulses.     Heart sounds: Normal heart sounds. No murmur. No friction rub. No gallop.   Pulmonary:     Effort: Pulmonary effort is normal.     Breath sounds: Normal breath sounds.  Abdominal:     General: Abdomen is flat. Bowel sounds are normal. There is no distension.     Palpations: Abdomen is soft.     Tenderness: There is no abdominal tenderness. There is no guarding or rebound.  Musculoskeletal:     Cervical back: Neck supple.     Comments: Able to move all 4 extremities without difficulty. No lower extremity edema. Full ROM of all toes bilaterally. Distal pulses and sensation intact. Able to ambulate in the ED without difficulty.   Skin:    General: Skin is warm and dry.  Neurological:     General: No focal deficit present.     Mental Status: She is alert.  Psychiatric:        Thought Content: Thought content includes homicidal and suicidal ideation. Thought content includes suicidal plan.     ED Results / Procedures / Treatments   Labs (all labs ordered are listed, but only abnormal results are displayed) Labs Reviewed  RESPIRATORY PANEL BY RT PCR (FLU A&B, COVID)  COMPREHENSIVE METABOLIC PANEL  ETHANOL  SALICYLATE LEVEL  ACETAMINOPHEN LEVEL  CBC  RAPID URINE DRUG SCREEN, HOSP PERFORMED  I-STAT BETA HCG BLOOD, ED ([facility], [facility], [facility] ONLY)    EKG None  Radiology No results found.  Procedures Procedures (including critical care time)  Medications Ordered in ED Medications - No data to display  ED Course  I have reviewed the triage vital signs and the nursing notes.  Pertinent labs & imaging results that were available during my care of the patient were reviewed by me and considered in my medical decision making (see  chart for details).  Clinical Course as of May 24 957  Fri May 25, 2019  0957 COCAINE(!): POSITIVE [CA]  0957 Tetrahydrocannabinol(!): POSITIVE [CA]  0957 Potassium(!): 3.1 [CA]    Clinical Course User Index [CA] 0958, PA-C   MDM Rules/Calculators/A&P                     44 year old female with a past medical history significant for cocaine abuse, PTSD, depression, and cannabis abuse who presents to the ED due to suicidal ideations. She also admits to HI and auditory/visual hallucinations. Not currently on any medications for her mental health disorders.  Stable vitals.  Patient no acute distress and non-ill-appearing.  Physical exam reassuring.  Will obtain medical clearance labs and COVID test.  CBC reassuring with no leukocytosis.  CMP reassuring with hypokalemia at 3.1.  Potassium repleted here in the ED.  UDS positive for cocaine and THC.  All other labs unremarkable.  Will consult TTS for further evaluation.  Patient medically cleared for TTS evaluation.  Spoke to [facility] who agrees to observe patient overnight. Patient placed in psych hold. No home medications to order.  Final Clinical Impression(s) / ED Diagnoses Final diagnoses:  None    Rx / DC Orders ED Discharge Orders    None       Karie Kirks 05/25/19 1426    Hayden Rasmussen, MD 05/25/19 1723

## 2019-05-25 NOTE — BH Assessment (Signed)
[facility] Assessment Progress Note  Case was staffed with Anselm Jungling NP who recommended patient be observed and monitored.

## 2019-05-25 NOTE — ED Notes (Signed)
Signed         Show:Clear all [x] Manual[] Template[] Copied  Added by: [x] , RN  [] Hover for details Th pt is alert x4 , expressed to writer that she is having increase anxiety. States that she called the police for help with suicidal thoughts this week, they found a small amount of crack cocaine and took her to jail instead. The pt is upset because she now has a felony charge. She is still feeling suicidal, and has a fear that if she is d/c she will harm herself, states that she is also in between homes, and would like to speak with someone about housing options.

## 2019-05-25 NOTE — Progress Notes (Signed)
Th pt is alert x4 , expressed to writer that she is having increase anxiety. States that she called the police for help with suicidal thoughts this week, they found a small amount of crack cocaine and took her to jail instead. The pt is upset because she now has a felony charge. She is still feeling suicidal, and has a fear that if she is d/c she will harm herself, states that she is also in between homes, and would like to speak with someone about housing options.

## 2019-05-25 NOTE — ED Triage Notes (Signed)
Pt presents with police with c/o SI for the past 2 days. Pt reports she was going to cut herself, then says "I didn't really know, but I just know that I was going to end it however I needed to." Police report that she was recently at the jail but has been released. Pt reports she has had thoughts of HI in the past but none at this time.
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
## 2019-05-25 NOTE — ED Notes (Signed)
On admission to the TCU pt is calm and cooperative. 

## 2019-05-26 DIAGNOSIS — F14251 Cocaine dependence with cocaine-induced psychotic disorder with hallucinations: Secondary | ICD-10-CM

## 2019-05-26 MED ORDER — GABAPENTIN 100 MG PO CAPS
200.0000 mg | ORAL_CAPSULE | Freq: Three times a day (TID) | ORAL | Status: DC
  Administered 2019-05-26 – 2019-05-27 (×4): 200 mg via ORAL
  Filled 2019-05-26 (×4): qty 2

## 2019-05-26 MED ORDER — FLUOXETINE HCL 20 MG PO CAPS
20.0000 mg | ORAL_CAPSULE | Freq: Every day | ORAL | Status: DC
  Administered 2019-05-26 – 2019-05-27 (×2): 20 mg via ORAL
  Filled 2019-05-26 (×2): qty 1

## 2019-05-26 NOTE — Consult Note (Addendum)
[facility] Psych ED Progress Note  05/26/2019 12:10 PM Amy Le  MRN:  [id number] Subjective:  "I started using drug again and I feel bad about it'. 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 Bipolar 1 disorder ([place])   . BV (bacterial vaginosis)   . Chlamydia   . Trichomonas     Past Surgical History:  Procedure Laterality Date  . FOOT SURGERY    . MULTIPLE TOOTH EXTRACTIONS Bilateral 2017  . TUBAL LIGATION     Family History:  Family History  Problem Relation Age of Onset  . Mental illness Neg Hx    Family Psychiatric  History: unknown Social History:  Social History   Substance and Sexual Activity  Alcohol Use No     Social History   Substance and Sexual Activity  Drug Use Yes  . Types: "Crack" cocaine, Cocaine, Marijuana   Comment: 100-200 dollars per day    Social History   Socioeconomic History  . Marital status: Single    Spouse name: Not on file  . Number of children: Not on file  . Years of education: Not on file  . Highest education level: Not on file  Occupational History  . Not on file  Tobacco Use  . Smoking status: Current Every Day Smoker    Packs/day: 0.50    Types: Cigarettes  . Smokeless tobacco: Never Used  . Tobacco comment: Refused  Substance and Sexual Activity  . Alcohol use: No  . Drug use: Yes    Types: "Crack" cocaine, Cocaine, Marijuana    Comment: 100-200 dollars per day  . Sexual activity: Yes    Birth control/protection: Surgical  Other Topics Concern  . Not on file  Social History Narrative  . Not on file   Social Determinants of Health   Financial Resource Strain:   . Difficulty of  Paying Living Expenses:   Food Insecurity:   . Worried About Charity fundraiser in the Last Year:   . Arboriculturist in the Last Year:   Transportation Needs:   . Film/video editor (Medical):   Marland Kitchen Lack of Transportation (Non-Medical):   Physical Activity:   . Days of Exercise per Week:   . Minutes of Exercise per Session:   Stress:   . Feeling of Stress :   Social Connections:   . Frequency of Communication with Friends and Family:   . Frequency of Social Gatherings with Friends and Family:   . Attends Religious Services:   . Active Member of Clubs or Organizations:   . Attends Archivist Meetings:   Marland Kitchen Marital Status:     Sleep: Fair  Appetite:  Good  Current Medications: Current Facility-Administered Medications  Medication Dose Route Frequency Provider Last Rate Last Admin  . acetaminophen (TYLENOL) tablet 650 mg  650 mg Oral Q4H PRN Suzy Bouchard, PA-C      . FLUoxetine (PROZAC) capsule 20 mg  20 mg Oral Daily Chanita Boden, MD   20 mg at 05/26/19 1119  . gabapentin (NEURONTIN) capsule 200 mg  200 mg Oral TID Corena Pilgrim, MD   200 mg at 05/26/19 1118   No current outpatient medications on file.    Lab Results:  Results for orders placed or performed during the hospital encounter of 05/25/19 (from the past 48 hour(s))  Comprehensive metabolic panel     Status: Abnormal   Collection Time: 05/25/19  8:49 AM  Result Value Ref Range   Sodium 139 135 - 145 mmol/L   Potassium 3.1 (L) 3.5 - 5.1 mmol/L   Chloride 106 98 - 111 mmol/L   CO2 25 22 - 32 mmol/L   Glucose, Bld 85 70 - 99 mg/dL    Comment: Glucose reference range applies only to samples taken after fasting for at least 8 hours.   BUN 10 6 - 20 mg/dL   Creatinine, Ser 8.56 0.44 - 1.00 mg/dL   Calcium 9.0 8.9 - 31.4 mg/dL   Total Protein 7.3 6.5 - 8.1 g/dL   Albumin 4.2 3.5 - 5.0 g/dL   AST 18 15 - 41 U/L   ALT 11 0 - 44 U/L   Alkaline Phosphatase 54 38 - 126 U/L   Total Bilirubin  0.7 0.3 - 1.2 mg/dL   GFR calc non Af Amer >60 >60 mL/min   GFR calc Af Amer >60 >60 mL/min   Anion gap 8 5 - 15    Comment: Performed at [facility], [address], [place], [place] [address]  cbc     Status: None   Collection Time: 05/25/19  8:49 AM  Result Value Ref Range   WBC 6.0 4.0 - 10.5 K/uL   RBC 4.59 3.87 - 5.11 MIL/uL   Hemoglobin 13.1 12.0 - 15.0 g/dL   HCT 37.8 58.8 - 50.2 %   MCV 89.5 80.0 - 100.0 fL   MCH 28.5 26.0 - 34.0 pg   MCHC 31.9 30.0 - 36.0 g/dL   RDW 77.4 12.8 - 78.6 %   Platelets 211 150 - 400 K/uL   nRBC 0.0 0.0 - 0.2 %    Comment: Performed at [facility], [address], [place], [place] [address]  Ethanol     Status: None   Collection Time: 05/25/19  8:50 AM  Result Value Ref Range   Alcohol, Ethyl (B) <10 <10 mg/dL    Comment: (NOTE) Lowest detectable limit for serum alcohol is 10 mg/dL. For medical purposes only. Performed at [facility], [address] [place], [place] [address]   Salicylate level     Status: Abnormal   Collection Time: 05/25/19  8:50 AM  Result Value Ref Range   Salicylate Lvl <7.0 (L) 7.0 - 30.0 mg/dL    Comment: Performed at [facility], [address] [place], [place] [address]  Acetaminophen level     Status: Abnormal   Collection Time: 05/25/19  8:50 AM  Result Value Ref Range   Acetaminophen (Tylenol), Serum <10 (L) 10 - 30 ug/mL    Comment: (NOTE) Therapeutic concentrations vary significantly. A range of 10-30 ug/mL  may be an effective concentration for many patients. However, some  are best treated at concentrations outside of this range. Acetaminophen concentrations >150 ug/mL at 4 hours after ingestion  and >50 ug/mL at 12 hours after ingestion are often associated with  toxic reactions. Performed at [facility], [address] [place], [place] [address]   Rapid urine drug screen (hospital performed)      Status: Abnormal   Collection Time: 05/25/19  8:50 AM  Result Value Ref Range   Opiates NONE DETECTED NONE DETECTED   Cocaine POSITIVE (A) NONE DETECTED  Benzodiazepines NONE DETECTED NONE DETECTED   Amphetamines NONE DETECTED NONE DETECTED   Tetrahydrocannabinol POSITIVE (A) NONE DETECTED   Barbiturates NONE DETECTED NONE DETECTED    Comment: (NOTE) DRUG SCREEN FOR MEDICAL PURPOSES ONLY.  IF CONFIRMATION IS NEEDED FOR ANY PURPOSE, NOTIFY LAB WITHIN 5 DAYS. LOWEST DETECTABLE LIMITS FOR URINE DRUG SCREEN Drug Class                     Cutoff (ng/mL) Amphetamine and metabolites    1000 Barbiturate and metabolites    200 Benzodiazepine                 200 Tricyclics and metabolites     300 Opiates and metabolites        300 Cocaine and metabolites        300 THC                            50 Performed at [facility], [address], [place], [place] [address]   Respiratory Panel by RT PCR (Flu A&B, Covid) - Nasopharyngeal Swab     Status: None   Collection Time: 05/25/19  8:50 AM   Specimen: Nasopharyngeal Swab  Result Value Ref Range   SARS Coronavirus 2 by RT PCR NEGATIVE NEGATIVE    Comment: (NOTE) SARS-CoV-2 target nucleic acids are NOT DETECTED. The SARS-CoV-2 RNA is generally detectable in upper respiratoy specimens during the acute phase of infection. The lowest concentration of SARS-CoV-2 viral copies this assay can detect is 131 copies/mL. A negative result does not preclude SARS-Cov-2 infection and should not be used as the sole basis for treatment or other patient management decisions. A negative result may occur with  improper specimen collection/handling, submission of specimen other than nasopharyngeal swab, presence of viral mutation(s) within the areas targeted by this assay, and inadequate number of viral copies (<131 copies/mL). A negative result must be combined with clinical observations, patient history, and epidemiological  information. The expected result is Negative. Fact Sheet for Patients:  https://www.moore.com/ Fact Sheet for Healthcare Providers:  https://www.young.biz/ This test is not yet ap proved or cleared by the [place] FDA and  has been authorized for detection and/or diagnosis of SARS-CoV-2 by FDA under an Emergency Use Authorization (EUA). This EUA will remain  in effect (meaning this test can be used) for the duration of the COVID-19 declaration under Section 564(b)(1) of the Act, 21 U.S.C. section 360bbb-3(b)(1), unless the authorization is terminated or revoked sooner.    Influenza A by PCR NEGATIVE NEGATIVE   Influenza B by PCR NEGATIVE NEGATIVE    Comment: (NOTE) The Xpert Xpress SARS-CoV-2/FLU/RSV assay is intended as an aid in  the diagnosis of influenza from Nasopharyngeal swab specimens and  should not be used as a sole basis for treatment. Nasal washings and  aspirates are unacceptable for Xpert Xpress SARS-CoV-2/FLU/RSV  testing. Fact Sheet for Patients: https://www.moore.com/ Fact Sheet for Healthcare Providers: https://www.young.biz/ This test is not yet approved or cleared by the [place] FDA and  has been authorized for detection and/or diagnosis of SARS-CoV-2 by  FDA under an Emergency Use Authorization (EUA). This EUA will remain  in effect (meaning this test can be used) for the duration of the  Covid-19 declaration under Section 564(b)(1) of the Act, 21  U.S.C. section 360bbb-3(b)(1), unless the authorization is  terminated or revoked. Performed at [facility], [address] [place],  [place] [id number]   I-Stat beta hCG blood, ED     Status: None   Collection Time: 05/25/19  8:54 AM  Result Value Ref Range   I-stat hCG, quantitative <5.0 <5 mIU/mL   Comment 3            Comment:   GEST. AGE      CONC.  (mIU/mL)   <=1 WEEK        5 - 50     2 WEEKS        50 - 500     3 WEEKS       100 - 10,000     4 WEEKS     1,000 - 30,000        FEMALE AND NON-PREGNANT FEMALE:     LESS THAN 5 mIU/mL     Blood Alcohol level:  Lab Results  Component Value Date   ETH <10 05/25/2019   ETH <10 04/18/2019    Physical Findings: AIMS:  , ,  ,  ,    CIWA:  CIWA-Ar Total: 3 COWS:     Musculoskeletal: Strength & Muscle Tone: within normal limits Gait & Station: normal Patient leans: N/A  Psychiatric Specialty Exam: Physical Exam Cardiovascular:     Rate and Rhythm: Normal rate.  Pulmonary:     Effort: Pulmonary effort is normal.  Musculoskeletal:        General: Normal range of motion.     Cervical back: Normal range of motion.  Neurological:     Mental Status: She is alert.  Psychiatric:     Comments: See psychiatric assessment     Review of Systems  Psychiatric/Behavioral: Positive for hallucinations (improving since admission) and suicidal ideas (improving since admission, " I feel better today"). The patient is nervous/anxious.     Blood pressure 126/63, pulse 75, temperature 98.2 F (36.8 C), temperature source Oral, resp. rate 18, height 5\' 6"  (1.676 m), weight 59 kg, SpO2 98 %.Body mass index is 20.98 kg/m.  General Appearance: Casual  Eye Contact:  Fair  Speech:  Clear and Coherent and talkative  Volume:  Increased  Mood:  Anxious and Depressed  Affect:  Congruent  Thought Process:  Goal Directed and Descriptions of Associations: Circumstantial  Orientation:  Full (Time, Place, and Person)  Thought Content:  Illogical and Hallucinations: Auditory  Suicidal Thoughts:  Yes.  with intent/plan, mood improving since admission  Homicidal Thoughts:  No  Memory:  Immediate;   Good Recent;   Good Remote;   Good  Judgement:  Other:  as evidenced by suicidal thoughts; improving  Insight:  Fair  Psychomotor Activity:  Normal  Concentration:  Concentration: Good and Attention Span: Good  Recall:  Good  Fund of Knowledge:  Fair   Language:  Good  Akathisia:  NA  Handed:  Right  AIMS (if indicated):     Assets:  Resilience  ADL's:  Intact  Cognition:  WNL  Sleep:   <6 hours      Treatment Plan Summary: Daily contact with patient to assess and evaluate symptoms and progress in treatment and Medication management  Patient admitted for evaluation of suicidal ideations with plan to cut her wrist in the context of + UDS for cocaine and THC. Her HPI is complicated by law enforcement involvement as she states she called 911 with suicidal ideations but was subsequently found with cocaine and charged with possession.  She has a pmhx for depression, previously took zoloft(she cannot remember dosing) with  improvement in mood stability.  States zoloft was prescribed by her rehab facility. Recently relocated to [place] from [place], [place] where she previously resided at [facility]. Patient relates she discontinued taking antidepressant because she wanted to use cocaine and THC.   Her presentation is suspicious for secondary gain as she is homeless and has pending legal action.   In the context of safety, will restart antidepressant medications, observe overnight for mood stabilization and medication side effects.  Since she has already verbalized mood impairment since admission, anticipate she will continue to improve with medication administration.  Reviewed plan of discharge with patient for tomorrow of which she agrees.    Peer support consult placed for substance abuse resources SW consult placed for homeless resources  Medications:  Start the following:  Prozac 20mg  po daily for depressive symptoms.  Gabapentin 200mg  po TID for withdrawal symptoms  , NP 05/26/2019, 12:10 PM  Patient seen face-to-face for psychiatric evaluation, chart reviewed and case discussed with the physician extender and developed treatment plan. Reviewed the information documented and agree with the treatment  plan. Chales Abrahams, MD

## 2019-05-26 NOTE — ED Notes (Signed)
Pt alert this shift. Pt oriented. Pt guarded, blunted. Pt cooperative. Pt resting in bed.

## 2019-05-26 NOTE — Discharge Instructions (Signed)
For assistance obtaining emergency housing resources, please contact the [facility] at [phone].

## 2019-05-26 NOTE — Progress Notes (Signed)
CSW received call to assist with providing patient with homeless resources. CSW gave Waynetta Sandy, RN a list of resources to give to patient, contact information for [facility] was added to patient's AVS.  Edwin Dada, MSW, LCSW-A Transitions of Care  Clinical Social Worker  [facility] Emergency Departments  Medical ICU [phone]

## 2019-05-26 NOTE — Patient Outreach (Signed)
CPSS met with Pt an addressed the fact that CPSS will consult with Pt 1st thing in the AM because of the fact that Pt will be held for observations over night.

## 2019-05-27 MED ORDER — GABAPENTIN 100 MG PO CAPS: 100.0000 mg | ORAL_CAPSULE | Freq: Two times a day (BID) | ORAL | Status: DC

## 2019-05-27 NOTE — BHH Suicide Risk Assessment (Cosign Needed)
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 rate 18, height 5\' 6"  (1.676 m), weight 59 kg, SpO2 98 %.Body mass index is 20.98 kg/m.  General Appearance: Casual and Neat  Eye Contact::  Good  Speech:  Clear and Coherent and Normal Rate409  Volume:  Normal  Mood:  Euthymic  Affect:  Congruent  Thought Process:  Coherent, Goal Directed and Descriptions of Associations: Intact  Orientation:  Full (Time, Place, and Person)  Thought Content:  Logical  Suicidal Thoughts:  No  Homicidal Thoughts:  No  Memory:  Immediate;   Good Recent;   Good Remote;   Good  Judgement:  Other:  as evidenced by continuous drug usage; but intact at assessment  Insight:  Good  Psychomotor Activity:  Normal  Concentration:  Good  Recall:  Good  Fund of Knowledge:Fair  Language: Good  Akathisia:  Negative  Handed:  Right  AIMS (if indicated):     Assets:  Communication Skills Resilience  Sleep:     Cognition: WNL  ADL's:  Intact   Mental Status Per Nursing Assessment::   On Admission:     Demographic Factors:  Low socioeconomic status  Loss Factors: Financial problems/change in socioeconomic status  Historical Factors: Impulsivity and related to substance use disorder  Risk Reduction Factors:   Sense of responsibility to family  Continued Clinical Symptoms:  Alcohol/Substance Abuse/Dependencies Previous Psychiatric Diagnoses and  Treatments  Cognitive Features That Contribute To Risk:  None    Suicide Risk:  Mild: Suicidal ideation of limited frequency, intensity, duration, and specificity.  There are no identifiable plans, no associated intent, mild dysphoria and related symptoms, good self-control (both objective and subjective assessment), few other risk factors, and identifiable protective factors, including available and accessible social support.   Plan Of Care/Follow-up recommendations:   Patient presents to [facility] emergency department for evaluation of suicidal ideations without a plan in the context of polysubstance abuse.  Patient's UDS was positive for cocaine and THC.  She has a pmhx for MDD, PTSD and polysubstance use disorder and is known to our facility for similar presentation over the past three years.  Her most recent evaluation was on 2/3 for cocaine induced mood disorder, suicidal ideations that was suspicious for secondary housing gain.   She was discharged with outpatient resources for housing and outpatient rehab, of which it appears she has not utilized.   Regarding his current admission, she was started on fluoxetine 20mg  po daily and gabapentin 200mg  BID, today reports symptomatic improvement in her mood.  She no longer endorses suicidal ideations and denies a plan for self harm.  She did report dizziness, which could be secondary to starting gabapentin.  Her dose was lowered to Gabapentin 100mg  BID to target her symptoms and encourage adherence.   Peer Support ordered for outpatient substance treatment recommendations; SW ordered for housing resources.    Discharge home.  The patient appears reasonably screened and/or stabilized for discharge and does not appear to have emergency medical/psychiatric concerns/conditions requiring further screening, evaluation, or  treatment at this time prior to discharge.    Chales Abrahams, NP 05/27/2019, 2:35 PM

## 2019-05-27 NOTE — Patient Outreach (Signed)
ED Peer Support Specialist Patient Intake (Complete at intake & 30-60 Day Follow-up)  Name: Regis Wiland  MRN: [id number]  Age: 44 y.o.   Date of Admission: 05/27/2019  Intake: Initial Comments:      Primary Reason Admitted: Polysubstance abuse ( [place])    Lab values: Alcohol/ETOH: Positive Positive UDS? Yes Amphetamines: No Barbiturates: No Benzodiazepines: No Cocaine: Yes Opiates: No Cannabinoids: Yes  Demographic information: Gender: Female Ethnicity: African American Marital Status: Single Insurance Status: Uninsured/Self-pay Ecologist (Work Neurosurgeon, Physicist, medical, etc.: No(unemployment) Lives with: Alone Living situation: Homeless  Reported Patient History: Patient reported health conditions: Anxiety disorders, Bipolar disorder, Depression Patient aware of HIV and hepatitis status: No  In past year, has patient visited ED for any reason? Yes  Number of ED visits: 1  Reason(s) for visit: Same reason  In past year, has patient been hospitalized for any reason?    Number of hospitalizations:    Reason(s) for hospitalization:    In past year, has patient been arrested?    Number of arrests:    Reason(s) for arrest:    In past year, has patient been incarcerated?    Number of incarcerations:    Reason(s) for incarceration:    In past year, has patient received medication-assisted treatment? No  In past year, patient received the following treatments:    In past year, has patient received any harm reduction services? No  Did this include any of the following?    In past year, has patient received care from a mental health provider for diagnosis other than SUD? No  In past year, is this first time patient has overdosed? No  Number of past overdoses:    In past year, is this first time patient has been hospitalized for an overdose? No  Number of hospitalizations for overdose(s):    Is patient currently receiving  treatment for a mental health diagnosis? No  Patient reports experiencing difficulty participating in SUD treatment: No    Most important reason(s) for this difficulty?    Has patient received prior services for treatment? No  In past, patient has received services from following agencies:    Plan of Care:  Suggested follow up at these agencies/treatment centers:    Other information: CPSS met with Pt an was able to gain information to better assist Pt CPSS contacted several places for treatment. CPSS contact a facility an are getting her set up with housing as well as substance abuse support. CPSS left contact information for community support.   Aaron Edelman Mearl Olver, [place]  05/27/2019 2:36 PM

## 2019-05-27 NOTE — ED Notes (Signed)
Pt is in with peer support.  Will discharge patient and let peer finish up after discharge.  All belongings returned to patient and discharge instructions were reviewed.

## 2019-06-03 ENCOUNTER — Emergency Department (HOSPITAL_COMMUNITY)
Admission: EM | Admit: 2019-06-03 | Discharge: 2019-06-03 | Disposition: A | Discharge status: Home / Self Care | Payer: Medicaid Other | Attending: Emergency Medicine | Admitting: Emergency Medicine

## 2019-06-03 ENCOUNTER — Other Ambulatory Visit: Payer: Self-pay

## 2019-06-03 ENCOUNTER — Encounter (HOSPITAL_COMMUNITY): Payer: Self-pay | Admitting: Emergency Medicine

## 2019-06-03 DIAGNOSIS — Z20828 Contact with and (suspected) exposure to other viral communicable diseases: Secondary | ICD-10-CM | POA: Insufficient documentation

## 2019-06-03 DIAGNOSIS — Z20822 Contact with and (suspected) exposure to covid-19: Secondary | ICD-10-CM | POA: Insufficient documentation

## 2019-06-03 DIAGNOSIS — R45851 Suicidal ideations: Secondary | ICD-10-CM | POA: Insufficient documentation

## 2019-06-03 DIAGNOSIS — M79604 Pain in right leg: Secondary | ICD-10-CM | POA: Insufficient documentation

## 2019-06-03 DIAGNOSIS — M79605 Pain in left leg: Secondary | ICD-10-CM | POA: Insufficient documentation

## 2019-06-03 DIAGNOSIS — F14959 Cocaine use, unspecified with cocaine-induced psychotic disorder, unspecified: Secondary | ICD-10-CM | POA: Insufficient documentation

## 2019-06-03 DIAGNOSIS — R4589 Other symptoms and signs involving emotional state: Secondary | ICD-10-CM

## 2019-06-03 LAB — URINALYSIS, ROUTINE W REFLEX MICROSCOPIC
Bilirubin Urine: NEGATIVE
Glucose, UA: NEGATIVE mg/dL
Ketones, ur: 5 mg/dL — AB
Leukocytes,Ua: NEGATIVE
Nitrite: NEGATIVE
Protein, ur: 30 mg/dL — AB
Specific Gravity, Urine: 1.025 (ref 1.005–1.030)
pH: 5 (ref 5.0–8.0)

## 2019-06-03 LAB — COMPREHENSIVE METABOLIC PANEL
ALT: 12 U/L (ref 0–44)
AST: 18 U/L (ref 15–41)
Albumin: 4.1 g/dL (ref 3.5–5.0)
Alkaline Phosphatase: 62 U/L (ref 38–126)
Anion gap: 9 (ref 5–15)
BUN: 7 mg/dL (ref 6–20)
CO2: 26 mmol/L (ref 22–32)
Calcium: 9.1 mg/dL (ref 8.9–10.3)
Chloride: 106 mmol/L (ref 98–111)
Creatinine, Ser: 0.8 mg/dL (ref 0.44–1.00)
GFR calc Af Amer: >60 mL/min (ref >60)
GFR calc non Af Amer: >60 mL/min (ref >60)
Glucose, Bld: 86 mg/dL (ref 70–99)
Potassium: 3.6 mmol/L (ref 3.5–5.1)
Sodium: 141 mmol/L (ref 135–145)
Total Bilirubin: 1 mg/dL (ref 0.3–1.2)
Total Protein: 7.5 g/dL (ref 6.5–8.1)

## 2019-06-03 LAB — CBC WITH DIFFERENTIAL/PLATELET
Abs Immature Granulocytes: 0.01 10*3/uL (ref 0.00–0.07)
Basophils Absolute: 0 10*3/uL (ref 0.0–0.1)
Basophils Relative: 1 %
Eosinophils Absolute: 0.3 10*3/uL (ref 0.0–0.5)
Eosinophils Relative: 7 %
HCT: 44.8 % (ref 36.0–46.0)
Hemoglobin: 14.2 g/dL (ref 12.0–15.0)
Immature Granulocytes: 0 %
Lymphocytes Relative: 41 %
Lymphs Abs: 1.7 10*3/uL (ref 0.7–4.0)
MCH: 28.6 pg (ref 26.0–34.0)
MCHC: 31.7 g/dL (ref 30.0–36.0)
MCV: 90.1 fL (ref 80.0–100.0)
Monocytes Absolute: 0.4 10*3/uL (ref 0.1–1.0)
Monocytes Relative: 10 %
Neutro Abs: 1.7 10*3/uL (ref 1.7–7.7)
Neutrophils Relative %: 41 %
Platelets: 224 10*3/uL (ref 150–400)
RBC: 4.97 MIL/uL (ref 3.87–5.11)
RDW: 14.5 % (ref 11.5–15.5)
WBC: 4 10*3/uL (ref 4.0–10.5)
nRBC: 0 % (ref 0.0–0.2)

## 2019-06-03 LAB — RESPIRATORY PANEL BY RT PCR (FLU A&B, COVID)
Influenza A by PCR: NEGATIVE
Influenza B by PCR: NEGATIVE
SARS Coronavirus 2 by RT PCR: NEGATIVE

## 2019-06-03 LAB — RAPID URINE DRUG SCREEN, HOSP PERFORMED
Amphetamines: NOT DETECTED
Barbiturates: NOT DETECTED
Benzodiazepines: NOT DETECTED
Cocaine: POSITIVE — AB
Opiates: NOT DETECTED
Tetrahydrocannabinol: POSITIVE — AB

## 2019-06-03 LAB — I-STAT BETA HCG BLOOD, ED (MC, WL, AP ONLY): I-stat hCG, quantitative: <5 m[IU]/mL (ref <5)

## 2019-06-03 LAB — ACETAMINOPHEN LEVEL: Acetaminophen (Tylenol), Serum: <10 ug/mL — ABNORMAL LOW (ref 10–30)

## 2019-06-03 LAB — ETHANOL: Alcohol, Ethyl (B): <10 mg/dL (ref <10)

## 2019-06-03 LAB — SALICYLATE LEVEL: Salicylate Lvl: <7 mg/dL — ABNORMAL LOW (ref 7.0–30.0)

## 2019-06-03 MED ORDER — IBUPROFEN 200 MG PO TABS
600.0000 mg | ORAL_TABLET | Freq: Once | ORAL | Status: AC
  Administered 2019-06-03: 12:00:00 600 mg via ORAL
  Filled 2019-06-03: qty 3

## 2019-06-03 NOTE — ED Provider Notes (Signed)
[facility]-EMERGENCY DEPT Provider Note   CSN: [id number] Arrival date & time: 06/03/19  1032     History Chief Complaint  Patient presents with  . Leg Pain  . Abdominal Pain  . Suicidal   Amy Le is a 44 y.o. female with past medical history significant for bipolar 1 disorder presenting to emergency room today via [facility] from [employer] [facility] with chief complaints of abdominal pain, bilateral leg pain and suicidal ideations x 2 days. She describes her abdominal pain as it feeling like her period cramps. She has history of tubal ligation and has not had vaginal bleeding in the last 1 year. She did not have any pads or tampons at the motel to use and describes the vaginal bleeding as light. She denies any associated pelvic pain, vaginal discharge. Her bilateral leg pain is described as feeling sore. She has been walking more than usual an her legs have started hurting as a result. She denies any fall or injury. She does admit to history of foot surgery and states she has had intermittent pain with increased ambulation.  She states she has not taken any of her psychiatric medications in quite some time.  She does admit to using crack cocaine x2 days ago.  Patient reports being suicidal without a plan.  She denies any homicidal ideations, auditory visual hallucinations.  History provided by patient with additional history obtained from chart review.     Past Medical History:  Diagnosis Date  . Bipolar 1 disorder (HCC)   . BV (bacterial vaginosis)   . Chlamydia   . Trichomonas     Patient Active Problem List   Diagnosis Date Noted  . Cocaine use disorder, mild, abuse (HCC)   . MDD (major depressive disorder) 04/17/2019  . Cocaine use disorder, severe, dependence (HCC) 07/20/2016  . MDD (major depressive disorder), recurrent, severe, with psychosis (HCC) 07/20/2016  . Cannabis use disorder, moderate, dependence (HCC) 07/20/2016  . Severe episode of recurrent  major depressive disorder, without psychotic features (HCC)   . PTSD (post-traumatic stress disorder) 03/25/2016  . 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 FOOT SURGERY    . MULTIPLE TOOTH EXTRACTIONS Bilateral 2017  . TUBAL LIGATION       OB History   No obstetric history on file.     Family History  Problem Relation Age of Onset  . Mental illness Neg Hx     Social History   Tobacco Use  . Smoking status: Current Every Day Smoker    Packs/day: 0.50    Types: Cigarettes  . Smokeless tobacco: Never Used  . Tobacco comment: Refused  Substance Use Topics  . Alcohol use: No  . Drug use: Yes    Types: "Crack" cocaine, Cocaine, Marijuana    Comment: 100-200 dollars per day    Home Medications Prior to Admission medications   Not on File    Allergies    Patient has no known allergies.  Review of Systems   Review of Systems All other systems are reviewed and are negative for acute change except as noted in the HPI.  Physical Exam Updated Vital Signs BP 127/86 (BP Location: Left Arm)   Pulse 67   Temp 97.6 F (36.4 C) (Oral)   Resp 17   SpO2 100%   Physical Exam Vitals and nursing note reviewed.  Constitutional:      General: She  is not in acute distress.    Appearance: She is not ill-appearing.  HENT:     Head: Normocephalic and atraumatic.     Right Ear: Tympanic membrane and external ear normal.     Left Ear: Tympanic membrane and external ear normal.     Nose: Nose normal.     Mouth/Throat:     Mouth: Mucous membranes are moist.     Pharynx: Oropharynx is clear.  Eyes:     General: No scleral icterus.       Right eye: No discharge.        Left eye: No discharge.     Extraocular Movements: Extraocular movements intact.     Conjunctiva/sclera: Conjunctivae normal.     Pupils: Pupils are equal, round, and reactive to light.  Neck:      Vascular: No JVD.  Cardiovascular:     Rate and Rhythm: Normal rate and regular rhythm.     Pulses: Normal pulses.          Radial pulses are 2+ on the right side and 2+ on the left side.     Heart sounds: Normal heart sounds.  Pulmonary:     Comments: Lungs clear to auscultation in all fields. Symmetric chest rise. No wheezing, rales, or rhonchi. Abdominal:     General: Bowel sounds are normal.     Tenderness: There is no right CVA tenderness or left CVA tenderness.     Comments: Abdomen is soft, non-distended, and non-tender in all quadrants. No rigidity, no guarding. No peritoneal signs.  Musculoskeletal:        General: Normal range of motion.     Cervical back: Normal range of motion.     Right hip: Normal.     Left hip: Normal.     Right knee: Normal.     Left knee: Normal.     Right lower leg: Normal. No edema.     Left lower leg: Normal. No edema.     Right ankle: Normal.     Left ankle: Normal.     Right foot: Normal.     Left foot: Normal.     Comments: Patient ambulates with normal gait, no deficits noted.  She is moving all extremities without signs of injury.  Skin:    General: Skin is warm and dry.     Capillary Refill: Capillary refill takes less than 2 seconds.  Neurological:     Mental Status: She is oriented to person, place, and time.     GCS: GCS eye subscore is 4. GCS verbal subscore is 5. GCS motor subscore is 6.     Comments: Fluent speech, no facial droop.  Psychiatric:        Behavior: Behavior normal.        Thought Content: Thought content includes suicidal ideation. Thought content does not include homicidal ideation. Thought content does not include homicidal or suicidal plan.     ED Results / Procedures / Treatments   Labs (all labs ordered are listed, but only abnormal results are displayed) Labs Reviewed  RAPID URINE DRUG SCREEN, HOSP PERFORMED - Abnormal; Notable for the following components:      Result Value   Cocaine POSITIVE (*)     Tetrahydrocannabinol POSITIVE (*)    All other components within normal limits  URINALYSIS, ROUTINE W REFLEX MICROSCOPIC - Abnormal; Notable for the following components:   APPearance HAZY (*)    Hgb urine dipstick LARGE (*)    Ketones,  ur 5 (*)    Protein, ur 30 (*)    Bacteria, UA RARE (*)    All other components within normal limits  SALICYLATE LEVEL - Abnormal; Notable for the following components:   Salicylate Lvl <6.5 (*)    All other components within normal limits  ACETAMINOPHEN LEVEL - Abnormal; Notable for the following components:   Acetaminophen (Tylenol), Serum <10 (*)    All other components within normal limits  RESPIRATORY PANEL BY RT PCR (FLU A&B, COVID)  COMPREHENSIVE METABOLIC PANEL  ETHANOL  CBC WITH DIFFERENTIAL/PLATELET  I-STAT BETA HCG BLOOD, ED ([facility], [facility], [facility] ONLY)    EKG None  Radiology No results found.  Procedures Procedures (including critical care time)  Medications Ordered in ED Medications  ibuprofen (ADVIL) tablet 600 mg (600 mg Oral Given 06/03/19 1142)    ED Course  I have reviewed the triage vital signs and the nursing notes.  Pertinent labs & imaging results that were available during my care of the patient were reviewed by me and considered in my medical decision making (see chart for details).    MDM Rules/Calculators/A&P                      Patient is reporting abdominal pain and bilateral leg pain, however has a benign exam. She is requesting pain medication for her cramps. Ibuprofen given.  Labs ordered.  CBC without leukocytosis, no anemia.  UA was without signs of infection.  UDS was positive for cocaine and tetrahydrocannabinol at this time patient is medically cleared for TTS evaluation.  Patient evaluated by behavioral health.  Please see their consult note.  Patient does not meet inpatient criteria.  She has been given resources in the past and even evaluated by peer support.  Patient seen by social work in the emergency  department and consent obtained for to put patient in a referral for outpatient to health services.  The patient appears reasonably screened and/or stabilized for discharge and I doubt any other medical condition or other [facility] requiring further screening, evaluation, or treatment in the ED at this time prior to discharge. The patient is safe for discharge with strict return precautions discussed.    Portions of this note were generated with Lobby[employer]. Dictation errors may occur despite best attempts at proofreading.    Final Clinical Impression(s) / ED Diagnoses Final diagnoses:  Suicidal behavior without attempted self-injury    Rx / DC Orders ED Discharge Orders    None       Flint Melter 06/03/19 1634    Lacretia Leigh, MD 06/05/19 [phone]

## 2019-06-03 NOTE — BHH Counselor (Signed)
Disposition:   Ophelia Shoulder, NP, patient does not meet criteria for inpatient and is psych-cleared

## 2019-06-03 NOTE — Discharge Instructions (Addendum)
Return to the emergency department for any new or worsening symptoms.  Recommend you take ibuprofen for cramps and abdominal pain.

## 2019-06-03 NOTE — ED Triage Notes (Signed)
Patient here from [employer] [facility] with complaints abd pain, leg pain, suicidal ideation. Hx of bipolar disorder.

## 2019-06-03 NOTE — ED Notes (Signed)
Pt ambulatory on discharge. Pt states she is going to catch the bus.

## 2019-06-03 NOTE — ED Notes (Signed)
TTS at bedside via computer. 

## 2019-06-03 NOTE — Progress Notes (Signed)
Consult request has been received. CSW attempting to follow up at present time  Nikita Surman M. Meckenzie Balsley LCSWA Transitions of Care  Clinical Social Worker  Ph: [phone] 

## 2019-06-03 NOTE — Progress Notes (Signed)
Referral created for [facility] community resource pool. Patient provided with bus pass for ride to his next destination.   No further needs at this time   Alycia Cooperwood M. Tovah Slavick LCSWA Transitions of Care  Clinical Social Worker  Ph: [phone] 

## 2019-06-03 NOTE — BH Assessment (Signed)
Tele Assessment Note   Patient Name: Amy Le MRN: [id number] Referring Physician:  Kathyrn Lass Location of Patient: [facility] Location of Provider: Behavioral Health TTS Department  Amy Le is an 44 y.o. female recently seen in the ED 05/27/2019 with the similar complaint, suicidal ideations. When asked what brings you back to the ED patient stated, "I really think this time I need medication. I am hearing voices even though I am using. I am suicidal/homicidal. Every time I put two feet forward I get knocked down. I am tired of trying so I don't want to live anymore, I just done!" Patient report suicidal ideations present past 3-days. She denied a plan and homicidal directed towards a specific person. Note review 05/27/2019 patient meet with peer support who provided resources for treatment.  Patient denied specific / plan towards homicidal ideations. Note review patient does not history of violent behavior. When asked why haven't you followed up with [facility] patient stated, "I don't have an answer for that."   Patient present alert. During the assessment she sat up in the bed and spoke in a clear tone. She did not appear to responding to internal stimuli. Her thoughts seemed organized and memory intact. Judgment, insight, and impulse contact were partially impaired.   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 Bipolar 1 disorder (HCC)   . BV (bacterial vaginosis)   . Chlamydia   . Trichomonas     Past Surgical History:  Procedure Laterality Date  . FOOT SURGERY    . MULTIPLE TOOTH EXTRACTIONS Bilateral 2017  . TUBAL LIGATION      Family History:  Family History  Problem Relation Age of Onset  . Mental illness Neg Hx     Social History:  reports that she has been smoking cigarettes. She has  been smoking about 0.50 packs per day. She has never used smokeless tobacco. She reports current drug use. Drugs: "Crack" cocaine, Cocaine, and Marijuana. She reports that she does not drink alcohol.  Additional Social History:  Alcohol / Drug Use Pain Medications: see MAR Prescriptions: see MAR Over the Counter: see MAR History of alcohol / drug use?: Yes Substance #1 Name of Substance 1: Cocaine 1 - Age of First Use: 20 1 - Amount (size/oz): $40 - $60 1 - Frequency: daily 1 - Duration: since onset 1 - Last Use / Amount: Friday 06/01/2019  CIWA: CIWA-Ar BP: (!) 146/75 Pulse Rate: 74 COWS:    Allergies: No Known Allergies  Home Medications: (Not in a hospital admission)   OB/GYN Status:  No LMP recorded. (Menstrual status: Other).  General Assessment Data Location of Assessment: [facility] TTS Assessment: In system Is this a Tele or Face-to-Face Assessment?: Face-to-Face Is this an Initial Assessment or a Re-assessment for this encounter?: Initial Assessment Patient Accompanied by:: N/A Language Other than English: No Living Arrangements: Homeless/Shelter What gender do you identify as?: Female Marital status: Single Maiden name: Fulbright  Pregnancy Status: No Living Arrangements: Alone Can pt return to current living arrangement?: Yes Admission Status: Voluntary Is patient capable of signing voluntary admission?: Yes Referral Source: Self/Family/Friend Insurance type: Self-pay     Crisis Care Plan Living Arrangements: Alone Name of Psychiatrist: None Name of Therapist: None  Education Status Is patient currently in school?: No Is the patient employed, unemployed or receiving disability?: Unemployed  Risk  to self with the past 6 months Suicidal Ideation: Yes-Currently Present Has patient been a risk to self within the past 6 months prior to admission? : Yes Suicidal Intent: No Has patient had any suicidal intent within the past 6 months prior to admission? :  Yes Is patient at risk for suicide?: No Suicidal Plan?: No Has patient had any suicidal plan within the past 6 months prior to admission? : Yes Specify Current Suicidal Plan: patient reported no plan Access to Means: No(pt stated, "no at this time" ) Specify Access to Suicidal Means: none report  What has been your use of drugs/alcohol within the last 12 months?: current use-cocaine  Previous Attempts/Gestures: Yes How many times?: 1 Other Self Harm Risks: cocaine abuse  Triggers for Past Attempts: Unknown Intentional Self Injurious Behavior: None Family Suicide History: No Recent stressful life event(s): Other (Comment)(substance abuse (cocaine) ) Persecutory voices/beliefs?: No Depression: Yes Depression Symptoms: Feeling worthless/self pity Substance abuse history and/or treatment for substance abuse?: No Suicide prevention information given to non-admitted patients: Not applicable  Risk to Others within the past 6 months Homicidal Ideation: Yes-Currently Present(report HI, no person named ) Does patient have any lifetime risk of violence toward others beyond the six months prior to admission? : No Thoughts of Harm to Others: Yes-Currently Present Comment - Thoughts of Harm to Others: report thoughts, no specific person, no plan  Current Homicidal Intent: No Current Homicidal Plan: No Access to Homicidal Means: No Identified Victim: none reported History of harm to others?: No Assessment of Violence: None Noted Violent Behavior Description: n/a Does patient have access to weapons?: No Criminal Charges Pending?: No Does patient have a court date: No Is patient on probation?: No  Psychosis Hallucinations: Auditory, Visual(report hearing and seening things ) Delusions: None noted  Mental Status Report Appearance/Hygiene: In scrubs Eye Contact: Fair Motor Activity: Unremarkable Speech: Logical/coherent Level of Consciousness: Alert Mood: Pleasant Affect: Appropriate to  circumstance Anxiety Level: None Thought Processes: Coherent, Relevant Judgement: Partial Orientation: Person, Place, Time Obsessive Compulsive Thoughts/Behaviors: None  Cognitive Functioning Concentration: Normal Memory: Recent Intact, Remote Intact Is patient IDD: No Insight: Fair Impulse Control: Fair Appetite: Good Have you had any weight changes? : No Change Sleep: No Change Total Hours of Sleep: 7 Vegetative Symptoms: None  ADLScreening [facility] Assessment Services) Patient's cognitive ability adequate to safely complete daily activities?: No Patient able to express need for assistance with ADLs?: No Independently performs ADLs?: Yes (appropriate for developmental age)  Prior Inpatient Therapy Prior Inpatient Therapy: Yes Prior Therapy Dates: 2020 Prior Therapy Facilty/Provider(s): [facility], [facility] Reason for Treatment: MH/SA issues  Prior Outpatient Therapy Prior Outpatient Therapy: No Does patient have an ACCT team?: No Does patient have Intensive In-House Services?  : No Does patient have [facility] services? : No Does patient have P4CC services?: No  ADL Screening (condition at time of admission) Patient's cognitive ability adequate to safely complete daily activities?: No Patient able to express need for assistance with ADLs?: No Independently performs ADLs?: Yes (appropriate for developmental age)             Merchant navy officer (For Healthcare) Does Patient Have a Medical Advance Directive?: No Would patient like information on creating a medical advance directive?: No - Patient declined          Disposition:  Disposition Initial Assessment Completed for this Encounter: Roxanne Mins, NP, patient does not meet criteria for inpt)  This service was provided via telemedicine using a 2-way, interactive audio and video technology.  Names of all persons participating in this telemedicine service and their role in this encounter. Name: Jacqualyn Sedgwick Role:  patient  Name: Despina Hidden  Role: TTS assessor   Name:  Role:   Name:  Role:     Despina Hidden 06/03/2019 2:59 PM

## 2019-07-05 ENCOUNTER — Emergency Department (HOSPITAL_COMMUNITY)
Admission: EM | Admit: 2019-07-05 | Discharge: 2019-07-05 | Disposition: A | Discharge status: Home / Self Care | Payer: Medicaid Other | Attending: Emergency Medicine | Admitting: Emergency Medicine

## 2019-07-05 DIAGNOSIS — F1410 Cocaine abuse, uncomplicated: Secondary | ICD-10-CM

## 2019-07-05 DIAGNOSIS — R45851 Suicidal ideations: Secondary | ICD-10-CM | POA: Insufficient documentation

## 2019-07-05 DIAGNOSIS — F14959 Cocaine use, unspecified with cocaine-induced psychotic disorder, unspecified: Secondary | ICD-10-CM | POA: Insufficient documentation

## 2019-07-05 DIAGNOSIS — F17210 Nicotine dependence, cigarettes, uncomplicated: Secondary | ICD-10-CM | POA: Insufficient documentation

## 2019-07-05 DIAGNOSIS — Z20822 Contact with and (suspected) exposure to covid-19: Secondary | ICD-10-CM | POA: Insufficient documentation

## 2019-07-05 LAB — CBC WITH DIFFERENTIAL/PLATELET
Abs Immature Granulocytes: 0.01 10*3/uL (ref 0.00–0.07)
Basophils Absolute: 0 10*3/uL (ref 0.0–0.1)
Basophils Relative: 1 %
Eosinophils Absolute: 0.4 10*3/uL (ref 0.0–0.5)
Eosinophils Relative: 8 %
HCT: 47.4 % — ABNORMAL HIGH (ref 36.0–46.0)
Hemoglobin: 15 g/dL (ref 12.0–15.0)
Immature Granulocytes: 0 %
Lymphocytes Relative: 38 %
Lymphs Abs: 1.8 10*3/uL (ref 0.7–4.0)
MCH: 28.5 pg (ref 26.0–34.0)
MCHC: 31.6 g/dL (ref 30.0–36.0)
MCV: 90.1 fL (ref 80.0–100.0)
Monocytes Absolute: 0.5 10*3/uL (ref 0.1–1.0)
Monocytes Relative: 9 %
Neutro Abs: 2.1 10*3/uL (ref 1.7–7.7)
Neutrophils Relative %: 44 %
Platelets: 261 10*3/uL (ref 150–400)
RBC: 5.26 MIL/uL — ABNORMAL HIGH (ref 3.87–5.11)
RDW: 13.9 % (ref 11.5–15.5)
WBC: 4.8 10*3/uL (ref 4.0–10.5)
nRBC: 0 % (ref 0.0–0.2)

## 2019-07-05 LAB — COMPREHENSIVE METABOLIC PANEL
ALT: 10 U/L (ref 0–44)
AST: 16 U/L (ref 15–41)
Albumin: 3.8 g/dL (ref 3.5–5.0)
Alkaline Phosphatase: 63 U/L (ref 38–126)
Anion gap: 6 (ref 5–15)
BUN: 9 mg/dL (ref 6–20)
CO2: 27 mmol/L (ref 22–32)
Calcium: 9.3 mg/dL (ref 8.9–10.3)
Chloride: 105 mmol/L (ref 98–111)
Creatinine, Ser: 0.75 mg/dL (ref 0.44–1.00)
GFR calc Af Amer: >60 mL/min (ref >60)
GFR calc non Af Amer: >60 mL/min (ref >60)
Glucose, Bld: 97 mg/dL (ref 70–99)
Potassium: 4.3 mmol/L (ref 3.5–5.1)
Sodium: 138 mmol/L (ref 135–145)
Total Bilirubin: 0.5 mg/dL (ref 0.3–1.2)
Total Protein: 7.5 g/dL (ref 6.5–8.1)

## 2019-07-05 LAB — ETHANOL: Alcohol, Ethyl (B): <10 mg/dL (ref <10)

## 2019-07-05 LAB — I-STAT BETA HCG BLOOD, ED (MC, WL, AP ONLY): I-stat hCG, quantitative: <5 m[IU]/mL (ref <5)

## 2019-07-05 LAB — RESPIRATORY PANEL BY RT PCR (FLU A&B, COVID)
Influenza A by PCR: NEGATIVE
Influenza B by PCR: NEGATIVE
SARS Coronavirus 2 by RT PCR: NEGATIVE

## 2019-07-05 LAB — ACETAMINOPHEN LEVEL: Acetaminophen (Tylenol), Serum: <10 ug/mL — ABNORMAL LOW (ref 10–30)

## 2019-07-05 LAB — SALICYLATE LEVEL: Salicylate Lvl: <7 mg/dL — ABNORMAL LOW (ref 7.0–30.0)

## 2019-07-05 MED ORDER — NICOTINE 21 MG/24HR TD PT24: 21.0000 mg | MEDICATED_PATCH | Freq: Every day | TRANSDERMAL | Status: DC

## 2019-07-05 NOTE — Patient Outreach (Signed)
ED Peer Support Specialist Patient Intake (Complete at intake & 30-60 Day Follow-up)  Name: Amy Le  MRN: [id number]  Age: 44 y.o.   Date of Admission: 07/05/2019  Intake: Initial Comments:      Primary Reason Admitted: Suicidal, detox   Lab values: Alcohol/ETOH: (P) Not completed Positive UDS? (P) Drug screen not completed Amphetamines: (P) Drug screen not completed Barbiturates: (P) Drug screen not completed Benzodiazepines: (P) Drug screen not completed Cocaine: (P) Drug screen not completed Opiates: (P) Drug screen not completed Cannabinoids: (P) Drug screen not completed  Demographic information: Gender: (P) Female Ethnicity: (P) African American Marital Status: (P) Divorced Insurance Status: (P) Uninsured/Self-pay Ecologist (Work Neurosurgeon, Physicist, medical, etc.: (P) No Lives with: (P) Alone Living situation: (P) Homeless  Reported Patient History: Patient reported health conditions: (P) Anxiety disorders, Bipolar disorder, Depression Patient aware of HIV and hepatitis status: (P) No  In past year, has patient visited ED for any reason? (P) Yes  Number of ED visits: (P) 2  Reason(s) for visit: (P) Same situation  In past year, has patient been hospitalized for any reason? (P) No  Number of hospitalizations:    Reason(s) for hospitalization:    In past year, has patient been arrested? (P) Yes  Number of arrests:    Reason(s) for arrest:    In past year, has patient been incarcerated?    Number of incarcerations:    Reason(s) for incarceration:    In past year, has patient received medication-assisted treatment? (P) No  In past year, patient received the following treatments: (P) Residential treatment (non-hospital)  In past year, has patient received any harm reduction services? (P) No  Did this include any of the following?    In past year, has patient received care from a mental health provider for diagnosis  other than SUD? (P) No  In past year, is this first time patient has overdosed? (P) No  Number of past overdoses:    In past year, is this first time patient has been hospitalized for an overdose? (P) No  Number of hospitalizations for overdose(s):    Is patient currently receiving treatment for a mental health diagnosis? (P) No  Patient reports experiencing difficulty participating in SUD treatment: (P) No    Most important reason(s) for this difficulty?    Has patient received prior services for treatment? (P) No  In past, patient has received services from following agencies:    Plan of Care:  Suggested follow up at these agencies/treatment centers: (P) (Seeking services with [facility].)  Other information: CPSS met with Pt an was able to complete series of questions as well as gain information to better assist Pt. CPSS was made aware that Pt was not able to be accepted across the street however Pt was able to speak with Intake at [facility] in [place]. Pt agreed to go to facility for treatment services. CPSS set up transportation for Pt to be transported today.    Aaron Edelman Kameryn Davern, CPSS  07/05/2019 1:13 PM

## 2019-07-05 NOTE — Discharge Instructions (Addendum)
Get help right away if: You have serious thoughts about hurting yourself or others. You have a seizure. You have chest pain. You have sudden weakness. You lose some of your vision. You lose some of your speech.

## 2019-07-05 NOTE — ED Provider Notes (Signed)
Amy Le DEPT Provider Note   CSN: [id number] Arrival date & time: 07/05/19  3299     History Chief Complaint  Patient presents with  . Suicidal  . detox    Amy Le is a 44 y.o. female with a past medical history of polysubstance abuse, crack cocaine cocaine addiction and dependence, history of cocaine induced psychosis.  She presents emergency department for suicidal ideation.  Patient states she goes through bouts of feeling suicidal but the past 2 days has been contemplating suicide almost obsessively.  She states that she is homeless and she just keeps thinking about stepping in front of one of the cars that are going by so fast.  She also has been thinking about cutting her wrists.  She admits that she has not been able to obtain cocaine for the past few days.  She has no other medical complaints.  She denies homicidal ideation or audiovisual hallucinations.  HPI     Past Medical History:  Diagnosis Date  . Bipolar 1 disorder (Amy Le)   . BV (bacterial vaginosis)   . Chlamydia   . Trichomonas     Patient Active Problem List   Diagnosis Date Noted  . Cocaine use disorder, mild, abuse (Amy Le)   . MDD (major depressive disorder) 04/17/2019  . Cocaine use disorder, severe, dependence (Amy Le) 07/20/2016  . 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 Cannabis use disorder, moderate, dependence (Amy Le) 07/20/2016  . Severe episode of recurrent major depressive disorder, without psychotic features (Amy Le)   . PTSD (post-traumatic stress disorder) 03/25/2016  . 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 FOOT SURGERY    . MULTIPLE TOOTH EXTRACTIONS Bilateral 2017  . TUBAL LIGATION       OB History   No obstetric history on file.     Family History  Problem Relation Age of Onset  . Mental  illness Neg Hx     Social History   Tobacco Use  . Smoking status: Current Every Day Smoker    Packs/day: 0.50    Types: Cigarettes  . Smokeless tobacco: Never Used  . Tobacco comment: Refused  Substance Use Topics  . Alcohol use: No  . Drug use: Yes    Types: "Crack" cocaine, Cocaine, Marijuana    Comment: 100-200 dollars per day    Home Medications Prior to Admission medications   Not on File    Allergies    Patient has no known allergies.  Review of Systems   Review of Systems Ten systems reviewed and are negative for acute change, except as noted in the HPI.   Physical Exam Updated Vital Signs BP 97/79 (BP Location: Left Arm)   Pulse 96   Temp 98.2 F (36.8 C) (Oral)   Resp 17   SpO2 100%   Physical Exam Physical Exam  Nursing note and vitals reviewed. Constitutional: She is oriented to person, place, and time. She appears well-developed and well-nourished. No distress.  HENT:  Head: Normocephalic and atraumatic.  Eyes: Conjunctivae normal and EOM are normal. Pupils are equal, round, and reactive to light. No scleral icterus.  Neck: Normal range of motion.  Cardiovascular: Normal rate, regular rhythm and normal heart sounds.  Exam reveals no gallop and no friction rub.   No murmur heard. Pulmonary/Chest: Effort normal and breath sounds normal. No respiratory distress.  Abdominal: Soft. Bowel sounds are normal.  She exhibits no distension and no mass. There is no tenderness. There is no guarding.  Neurological: She is alert and oriented to person, place, and time.  Skin: Skin is warm and dry. She is not diaphoretic.  Psych: Flat affect  ED Results / Procedures / Treatments   Labs (all labs ordered are listed, but only abnormal results are displayed) Labs Reviewed - No data to display  EKG None  Radiology No results found.  Procedures Procedures (including critical care time)  Medications Ordered in ED Medications - No data to display  ED  Course  I have reviewed the triage vital signs and the nursing notes.  Pertinent labs & imaging results that were available during my care of the patient were reviewed by me and considered in my medical decision making (see chart for details).    MDM Rules/Calculators/A&P                      Patient medically clear.  She was also cleared by psychiatry.  I agree with psychiatric assessment and is personally spoke with nurse practitioner Shanice over the phone.  I have low suspicion for high risk for completed suicide in I suspected that her feelings of suicidality are likely related to her inability to use cocaine and lack of stimulant use over the past 2 days.  The patient was seen by her peers support counselor Valentino Hue in [place] who was able to soothe cure the patient into a drug rehab program.  She will be picked up and taken directly.  I feel that this is the best option patient is agreement to go to treatment today.  She appears appropriate for discharge at this time Final Clinical Impression(s) / ED Diagnoses Final diagnoses:  None    Rx / DC Orders ED Discharge Orders    None       Arthor Captain, PA-C 07/05/19 1455    Pricilla Loveless, MD 07/06/19 1019
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
## 2019-07-05 NOTE — ED Triage Notes (Signed)
Per [facility] pt picked up at hotel. Pt reports she is trying to come off crack/cocaine so she doesn't have to do it on my own or I will kill myself. Last use was 2 days ago. Then stated that "cant believe I am still hearing that". but wouldn't respond to EMS.
